# Patient Record
Sex: Female | Born: 1945 | Race: White | Hispanic: No | Marital: Married | Smoking: Never smoker
Health system: Southern US, Community
[De-identification: ages and names within clinical notes are randomized; demographics above are authoritative.]

## PROBLEM LIST (undated history)

## (undated) DIAGNOSIS — C801 Malignant (primary) neoplasm, unspecified: Secondary | ICD-10-CM

## (undated) DIAGNOSIS — M1990 Unspecified osteoarthritis, unspecified site: Secondary | ICD-10-CM

## (undated) DIAGNOSIS — R102 Pelvic and perineal pain unspecified side: Secondary | ICD-10-CM

## (undated) DIAGNOSIS — T4145XA Adverse effect of unspecified anesthetic, initial encounter: Secondary | ICD-10-CM

## (undated) DIAGNOSIS — Z8719 Personal history of other diseases of the digestive system: Secondary | ICD-10-CM

## (undated) DIAGNOSIS — T8859XA Other complications of anesthesia, initial encounter: Secondary | ICD-10-CM

## (undated) DIAGNOSIS — F419 Anxiety disorder, unspecified: Secondary | ICD-10-CM

## (undated) DIAGNOSIS — M4800 Spinal stenosis, site unspecified: Secondary | ICD-10-CM

## (undated) DIAGNOSIS — F329 Major depressive disorder, single episode, unspecified: Secondary | ICD-10-CM

## (undated) DIAGNOSIS — R569 Unspecified convulsions: Secondary | ICD-10-CM

## (undated) DIAGNOSIS — N3010 Interstitial cystitis (chronic) without hematuria: Secondary | ICD-10-CM

## (undated) DIAGNOSIS — M797 Fibromyalgia: Secondary | ICD-10-CM

## (undated) DIAGNOSIS — K589 Irritable bowel syndrome without diarrhea: Secondary | ICD-10-CM

## (undated) DIAGNOSIS — G43909 Migraine, unspecified, not intractable, without status migrainosus: Secondary | ICD-10-CM

## (undated) DIAGNOSIS — F32A Depression, unspecified: Secondary | ICD-10-CM

## (undated) DIAGNOSIS — M810 Age-related osteoporosis without current pathological fracture: Secondary | ICD-10-CM

## (undated) DIAGNOSIS — K219 Gastro-esophageal reflux disease without esophagitis: Secondary | ICD-10-CM

## (undated) DIAGNOSIS — I10 Essential (primary) hypertension: Secondary | ICD-10-CM

## (undated) DIAGNOSIS — E785 Hyperlipidemia, unspecified: Secondary | ICD-10-CM

## (undated) DIAGNOSIS — E039 Hypothyroidism, unspecified: Secondary | ICD-10-CM

## (undated) DIAGNOSIS — T7840XA Allergy, unspecified, initial encounter: Secondary | ICD-10-CM

## (undated) DIAGNOSIS — H409 Unspecified glaucoma: Secondary | ICD-10-CM

## (undated) DIAGNOSIS — Z8679 Personal history of other diseases of the circulatory system: Secondary | ICD-10-CM

## (undated) DIAGNOSIS — E559 Vitamin D deficiency, unspecified: Secondary | ICD-10-CM

## (undated) DIAGNOSIS — H269 Unspecified cataract: Secondary | ICD-10-CM

## (undated) HISTORY — DX: Essential (primary) hypertension: I10

## (undated) HISTORY — DX: Hyperlipidemia, unspecified: E78.5

## (undated) HISTORY — DX: Unspecified convulsions: R56.9

## (undated) HISTORY — DX: Depression, unspecified: F32.A

## (undated) HISTORY — DX: Major depressive disorder, single episode, unspecified: F32.9

## (undated) HISTORY — PX: BREAST BIOPSY: SHX20

## (undated) HISTORY — DX: Anxiety disorder, unspecified: F41.9

## (undated) HISTORY — PX: CARDIOVASCULAR STRESS TEST: SHX262

## (undated) HISTORY — PX: UPPER GASTROINTESTINAL ENDOSCOPY: SHX188

## (undated) HISTORY — PX: CARDIAC CATHETERIZATION: SHX172

## (undated) HISTORY — DX: Unspecified cataract: H26.9

## (undated) HISTORY — DX: Age-related osteoporosis without current pathological fracture: M81.0

## (undated) HISTORY — PX: COLONOSCOPY: SHX174

## (undated) HISTORY — DX: Interstitial cystitis (chronic) without hematuria: N30.10

## (undated) HISTORY — DX: Allergy, unspecified, initial encounter: T78.40XA

## (undated) HISTORY — PX: CATARACT EXTRACTION W/ INTRAOCULAR LENS  IMPLANT, BILATERAL: SHX1307

---
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
## 1980-11-22 HISTORY — PX: TOTAL ABDOMINAL HYSTERECTOMY W/ BILATERAL SALPINGOOPHORECTOMY: SHX83

## 1994-11-22 HISTORY — PX: OTHER SURGICAL HISTORY: SHX169

## 2002-11-22 HISTORY — PX: GLAUCOMA SURGERY: SHX656

## 2007-10-10 ENCOUNTER — Encounter (INDEPENDENT_AMBULATORY_CARE_PROVIDER_SITE_OTHER): Payer: Self-pay | Admitting: *Deleted

## 2007-10-10 ENCOUNTER — Ambulatory Visit: Payer: Self-pay | Admitting: Cardiology

## 2007-10-10 ENCOUNTER — Observation Stay (HOSPITAL_COMMUNITY): Admission: EM | Admit: 2007-10-10 | Discharge: 2007-10-11 | Payer: Self-pay | Admitting: *Deleted

## 2007-10-11 ENCOUNTER — Encounter: Payer: Self-pay | Admitting: Internal Medicine

## 2007-10-11 ENCOUNTER — Encounter: Payer: Self-pay | Admitting: Cardiology

## 2007-10-25 ENCOUNTER — Ambulatory Visit: Payer: Self-pay

## 2007-10-26 ENCOUNTER — Ambulatory Visit: Payer: Self-pay

## 2007-10-26 ENCOUNTER — Ambulatory Visit: Payer: Self-pay | Admitting: Cardiology

## 2007-10-26 LAB — CONVERTED CEMR LAB
BUN: 16 mg/dL (ref 6–23)
Basophils Absolute: 0 10*3/uL (ref 0.0–0.1)
Basophils Relative: 0 % (ref 0.0–1.0)
CO2: 27 meq/L (ref 19–32)
Calcium: 9.7 mg/dL (ref 8.4–10.5)
Chloride: 107 meq/L (ref 96–112)
Creatinine, Ser: 0.8 mg/dL (ref 0.4–1.2)
Hemoglobin: 14.1 g/dL (ref 12.0–15.0)
INR: 0.9 (ref 0.8–1.0)
Monocytes Relative: 7.4 % (ref 3.0–11.0)
Platelets: 257 10*3/uL (ref 150–400)
Prothrombin Time: 11.8 s (ref 10.9–13.3)
RBC: 4.6 M/uL (ref 3.87–5.11)
RDW: 12.2 % (ref 11.5–14.6)
aPTT: 30.8 s — ABNORMAL HIGH (ref 21.7–29.8)

## 2007-10-27 ENCOUNTER — Ambulatory Visit (HOSPITAL_COMMUNITY): Admission: RE | Admit: 2007-10-27 | Discharge: 2007-10-27 | Payer: Self-pay | Admitting: Cardiology

## 2007-10-27 ENCOUNTER — Ambulatory Visit: Payer: Self-pay | Admitting: Cardiology

## 2007-12-01 ENCOUNTER — Ambulatory Visit: Payer: Self-pay | Admitting: Cardiology

## 2008-07-04 ENCOUNTER — Encounter: Admission: RE | Admit: 2008-07-04 | Discharge: 2008-07-04 | Payer: Self-pay | Admitting: Internal Medicine

## 2008-07-09 ENCOUNTER — Ambulatory Visit: Payer: Self-pay | Admitting: Internal Medicine

## 2008-07-09 DIAGNOSIS — R079 Chest pain, unspecified: Secondary | ICD-10-CM

## 2008-07-09 DIAGNOSIS — H409 Unspecified glaucoma: Secondary | ICD-10-CM | POA: Insufficient documentation

## 2008-07-09 DIAGNOSIS — K219 Gastro-esophageal reflux disease without esophagitis: Secondary | ICD-10-CM | POA: Insufficient documentation

## 2008-07-09 DIAGNOSIS — F329 Major depressive disorder, single episode, unspecified: Secondary | ICD-10-CM | POA: Insufficient documentation

## 2008-07-09 DIAGNOSIS — M129 Arthropathy, unspecified: Secondary | ICD-10-CM | POA: Insufficient documentation

## 2008-07-09 DIAGNOSIS — E039 Hypothyroidism, unspecified: Secondary | ICD-10-CM | POA: Insufficient documentation

## 2008-07-09 DIAGNOSIS — K589 Irritable bowel syndrome without diarrhea: Secondary | ICD-10-CM

## 2008-07-10 ENCOUNTER — Encounter: Payer: Self-pay | Admitting: Gastroenterology

## 2008-07-10 DIAGNOSIS — R933 Abnormal findings on diagnostic imaging of other parts of digestive tract: Secondary | ICD-10-CM | POA: Insufficient documentation

## 2008-07-11 ENCOUNTER — Ambulatory Visit (HOSPITAL_COMMUNITY): Admission: RE | Admit: 2008-07-11 | Discharge: 2008-07-11 | Payer: Self-pay | Admitting: Internal Medicine

## 2008-07-12 ENCOUNTER — Telehealth: Payer: Self-pay | Admitting: Internal Medicine

## 2008-07-15 ENCOUNTER — Ambulatory Visit: Payer: Self-pay | Admitting: Internal Medicine

## 2008-08-06 ENCOUNTER — Telehealth: Payer: Self-pay | Admitting: Internal Medicine

## 2008-08-08 ENCOUNTER — Encounter: Admission: RE | Admit: 2008-08-08 | Discharge: 2008-08-08 | Payer: Self-pay | Admitting: Internal Medicine

## 2008-11-22 HISTORY — PX: RHINOPLASTY: SUR1284

## 2009-08-13 ENCOUNTER — Encounter: Admission: RE | Admit: 2009-08-13 | Discharge: 2009-08-13 | Payer: Self-pay | Admitting: Internal Medicine

## 2009-10-30 ENCOUNTER — Encounter: Admission: RE | Admit: 2009-10-30 | Discharge: 2009-10-30 | Payer: Self-pay | Admitting: Otolaryngology

## 2010-02-17 ENCOUNTER — Encounter: Payer: Self-pay | Admitting: Cardiology

## 2010-02-18 ENCOUNTER — Ambulatory Visit: Payer: Self-pay | Admitting: Cardiology

## 2010-02-24 ENCOUNTER — Telehealth (INDEPENDENT_AMBULATORY_CARE_PROVIDER_SITE_OTHER): Payer: Self-pay | Admitting: *Deleted

## 2010-02-24 LAB — CONVERTED CEMR LAB
CRP, High Sensitivity: 2.1 (ref 0.00–5.00)
Sed Rate: 5 mm/hr (ref 0–22)

## 2010-02-25 ENCOUNTER — Encounter (HOSPITAL_COMMUNITY): Admission: RE | Admit: 2010-02-25 | Discharge: 2010-04-22 | Payer: Self-pay | Admitting: Cardiology

## 2010-02-25 ENCOUNTER — Ambulatory Visit: Payer: Self-pay | Admitting: Cardiology

## 2010-02-25 ENCOUNTER — Ambulatory Visit: Payer: Self-pay

## 2010-03-04 ENCOUNTER — Ambulatory Visit (HOSPITAL_COMMUNITY): Admission: RE | Admit: 2010-03-04 | Discharge: 2010-03-04 | Payer: Self-pay | Admitting: Cardiology

## 2010-03-04 ENCOUNTER — Encounter: Payer: Self-pay | Admitting: Cardiology

## 2010-03-04 ENCOUNTER — Ambulatory Visit: Payer: Self-pay

## 2010-03-04 ENCOUNTER — Ambulatory Visit: Payer: Self-pay | Admitting: Cardiovascular Disease

## 2010-03-04 HISTORY — PX: TRANSTHORACIC ECHOCARDIOGRAM: SHX275

## 2010-03-23 ENCOUNTER — Ambulatory Visit: Payer: Self-pay | Admitting: Cardiology

## 2010-03-23 ENCOUNTER — Telehealth: Payer: Self-pay | Admitting: Internal Medicine

## 2010-03-23 DIAGNOSIS — R1013 Epigastric pain: Secondary | ICD-10-CM | POA: Insufficient documentation

## 2010-03-26 ENCOUNTER — Encounter: Admission: RE | Admit: 2010-03-26 | Discharge: 2010-03-26 | Payer: Self-pay | Admitting: Cardiology

## 2010-03-26 ENCOUNTER — Ambulatory Visit: Payer: Self-pay | Admitting: Internal Medicine

## 2010-03-26 ENCOUNTER — Encounter (INDEPENDENT_AMBULATORY_CARE_PROVIDER_SITE_OTHER): Payer: Self-pay | Admitting: *Deleted

## 2010-03-26 DIAGNOSIS — R1011 Right upper quadrant pain: Secondary | ICD-10-CM

## 2010-03-27 LAB — CONVERTED CEMR LAB
ALT: 17 units/L (ref 0–35)
Basophils Relative: 0.6 % (ref 0.0–3.0)
Bilirubin Urine: NEGATIVE
Calcium: 8.9 mg/dL (ref 8.4–10.5)
Chloride: 107 meq/L (ref 96–112)
Creatinine, Ser: 1 mg/dL (ref 0.4–1.2)
Eosinophils Absolute: 0.8 10*3/uL — ABNORMAL HIGH (ref 0.0–0.7)
Eosinophils Relative: 9.2 % — ABNORMAL HIGH (ref 0.0–5.0)
GFR calc non Af Amer: 63.35 mL/min (ref >60)
HCT: 39.9 % (ref 36.0–46.0)
Hemoglobin, Urine: NEGATIVE
Ketones, ur: NEGATIVE mg/dL
Lymphs Abs: 2.2 10*3/uL (ref 0.7–4.0)
MCHC: 33.7 g/dL (ref 30.0–36.0)
MCV: 88.2 fL (ref 78.0–100.0)
Monocytes Absolute: 0.8 10*3/uL (ref 0.1–1.0)
Neutrophils Relative %: 54.3 % (ref 43.0–77.0)
Platelets: 275 10*3/uL (ref 150.0–400.0)
Potassium: 4.6 meq/L (ref 3.5–5.1)
RBC: 4.52 M/uL (ref 3.87–5.11)
Total Protein, Urine: NEGATIVE mg/dL
Total Protein: 6.6 g/dL (ref 6.0–8.3)
Urine Glucose: NEGATIVE mg/dL
WBC: 8.4 10*3/uL (ref 4.5–10.5)
pH: 6.5 (ref 5.0–8.0)

## 2010-03-30 ENCOUNTER — Ambulatory Visit: Payer: Self-pay | Admitting: Physician Assistant

## 2010-03-30 ENCOUNTER — Ambulatory Visit: Payer: Self-pay | Admitting: Internal Medicine

## 2010-03-30 ENCOUNTER — Telehealth: Payer: Self-pay | Admitting: Physician Assistant

## 2010-03-31 ENCOUNTER — Encounter: Payer: Self-pay | Admitting: Cardiology

## 2010-04-03 ENCOUNTER — Telehealth (INDEPENDENT_AMBULATORY_CARE_PROVIDER_SITE_OTHER): Payer: Self-pay | Admitting: *Deleted

## 2010-04-28 ENCOUNTER — Ambulatory Visit: Payer: Self-pay | Admitting: Internal Medicine

## 2010-08-25 ENCOUNTER — Encounter: Admission: RE | Admit: 2010-08-25 | Discharge: 2010-08-25 | Payer: Self-pay | Admitting: Internal Medicine

## 2010-11-22 HISTORY — PX: OTHER SURGICAL HISTORY: SHX169

## 2010-12-22 NOTE — Progress Notes (Signed)
Summary: change meds  Phone Note Call from Patient Call back at Work Phone [phone]   Caller: Patient Call For: Dr. Marina Goodell Reason for Call: Talk to Nurse, Privacy/Consent Authorization Summary of Call: allergic Cipro and would like a change in medication called in... [facility] on [facility] Initial call taken by: Vallarie Mare,  Mar 30, 2010 10:23 AM  Follow-up for Phone Call        cipro is not listed as an allergy  on her med record--lets wait for the culture before we call anything else in. Follow-up by: Sammuel Cooper PA-c,  Mar 30, 2010 2:40 PM

## 2010-12-22 NOTE — Assessment & Plan Note (Signed)
Summary: ok per anne/per check out/sf   Primary Provider:  Buren Kos, MD  CC:  follow up for echo and stress test. pt has not felt well at all for the last several days.  She reports no stamina.  History of Present Illness: 65 yo with history of hyperlipidemia, GERD, and probable acute pericarditis in 2008 returns for evaluation of chest pain.  Patient had an episode in 2008 of what was ultimately thought to be pericarditis.  She had an echo at that time showing a small pericardial effusion and actually ended up having a left heart cath, which showed normal coronaries.    Over the last 7-8 weeks, patient began to have episodes of a "hard" pain in her lower central chest/upper epigastrium.  These episodes were occurring about twice a week.  They are not related to exertion, she walks on a treadmill for 25-30 minutes most days and has no chest pain.  Pain will usually last about 10 minutes, then resolve spontaneously.  She has a history of GERD and thinks that this pain is different from her GERD.  She is actually already taking omeprazole 20 mg bid without relief of symptoms.   Pain is not pleuritic or positional.  No exertional dyspnea.  Of note, patient has been taking phentermine for weight loss.  However, she says that she was having chest pain episodes prior to starting phentermine.    Since I last saw her, the pain has gotten worse and is now happening every night, coming at some point after she eats dinner.  It is more clearly epigastric and band-like across the upper abdomen.  Over the weekend, she had an episode of RUQ pain radiating to the right flank that occurred after she ate breakfast.  This lasted all day.  She has very little energy.   ETT-myoview showed nonspecific < 1 mm ST depression on stress ECG and no evidence for ischemia or infarction on myoview images.  Echo showed moderate LVH, normal systolic function, and mild diastolic dysfunction.    ECG (initial appointment): NSR,  rSR' pattern, diffuse anterior and inferolateral T wave inversions. Compared to the 2008 ECG, T wave inversions are minimally more pronounced.   ECG (today): NSR, nonspecific T wave flattening diffusely, no LVH. T wave changes are less prominent than on the prior ECG.   Labs (3/11): ESR 5, CRP 2.1  Current Medications (verified): 1)  Synthroid 112 Mcg  Tabs (Levothyroxine Sodium) .... Take 1 Tablet By Mouth Once A Day 2)  Topamax 50 Mg  Tabs (Topiramate) .... Take 1 Tablet By Mouth Once A Day 3)  Cymbalta 60 Mg Cpep (Duloxetine Hcl) .... Once Daily 4)  Prilosec 20 Mg Cpdr (Omeprazole) .... Taek One Capsule Once Daily 5)  Centrum Silver   Tabs (Multiple Vitamins-Minerals) .... Take 1 Tablet By Mouth Once A Day 6)  Tylenol Extra Strength 500 Mg  Tabs (Acetaminophen) .... As Needed 7)  Zomig 5 Mg  Tabs (Zolmitriptan) .... As Needed 8)  Calcium 1200 1200-1000 Mg-Unit  Chew (Calcium Carbonate-Vit D-Min) .... Take 1 Tablet By Mouth Once A Day 9)  Vitamin D 16109 Unit  Caps (Ergocalciferol) .... Take 1 Tablet Weekly 10)  Allegra 180 Mg  Tabs (Fexofenadine Hcl) .... As Needed 11)  Fish Oil   Oil (Fish Oil) .... Take 1 Tablet By Mouth Once A Day 12)  Flax Seed Oil 1000 Mg  Caps (Flaxseed (Linseed)) .... Take 1 Tablet By Mouth Once A Day 13)  Travatan Z  0.004 %  Soln (Travoprost) .Marland Kitchen.. 1 Drop Each Eye Once Daily 14)  Flexeril 10 Mg Tabs (Cyclobenzaprine Hcl) .... As Needed For Pain 15)  Hydrochlorothiazide 12.5 Mg Caps (Hydrochlorothiazide) .... Take One Tablet Once Daily  Allergies (verified): 1)  ! Sulfa 2)  ! Avelox 3)  ! * Neomycin 4)  ! Asa 5)  Nsaids  Past History:  Past Medical History: 1. DEPRESSION (ICD-311) 2. IRRITABLE BOWEL SYNDROME (ICD-564.1) 3. HYPOTHYROIDISM (ICD-244.9) 4. GLAUCOMA (ICD-365.9) 5. GERD (ICD-530.81) 6. ARTHRITIS (ICD-716.90) 7. Acute pericarditis December 2008: Echo (12/08) showed EF 65%, mild AI, small pericardial effusion.  8. Myoview 12/08: no ischemia  or infarction.  ETT-myoview (4/11): 5'30", no chest pain (stopped due to fatigue), < 1 mm nonspecific ST depression in inferrior and anterolateral leads, anterior attenuation, no scar or ischemia on myoview images.  9. Left heart cath (12/08): Normal coronaries.  10. TAH/BSO 11. Hyperlipidemia 12. Echo (4/11): Moderate LVH (1.5 cm septal and posterior walls), EF 60%, mild diastolic dysfunction, mild AI.   Family History: Reviewed history from 02/18/2010 and no changes required. Family History of Breast Cancer: Maternal Aunt Family History of Uterine Cancer: MGM No FH of Colon Cancer: No premature CAD.   Review of Systems       All systems reviewed and negative except as per HPI.   Vital Signs:  Patient profile:   65 year old female Height:      62.5 inches Weight:      164 pounds BMI:     29.62 Pulse rate:   98 / minute Pulse rhythm:   regular BP sitting:   98 / 70  (left arm) Cuff size:   regular  Vitals Entered By: Judithe Modest CMA (Mar 23, 2010 2:00 PM)  Physical Exam  General:  Well developed, well nourished, no acute distress. Neck:  Neck supple, no JVD. No masses, thyromegaly or abnormal cervical nodes. Lungs:  Clear bilaterally to auscultation and percussion. Heart:  Non-displaced PMI, chest non-tender; regular rate and rhythm, S1, S2 without murmurs, rubs or gallops. Carotid upstroke normal, no bruit.  Pedals normal pulses. No edema, no varicosities. Abdomen:  Bowel sounds positive; abdomen soft. There was mild RUQ and peri-umbilical tenderness.  Extremities:  No clubbing or cyanosis. Neurologic:  Alert and oriented x 3. Psych:  Normal affect.   Impression & Recommendations:  Problem # 1:  CHEST PAIN (ICD-786.50) Chest pain now seems more epigastric and even RUQ in nature.  It seems to occur mostly at night after meals.  ETT-myoview was not suggestive of ischemia or infarction.  I think that GI sources of her pain need to be ruled out: will order abdominal US to  assess for gallstones (she has not had cholecystectomy).  I would also like her to see Dr. Marina Goodell (her GI MD) for possible EGD (? ulcer as cause of symptoms).  I cannot fully explain her abnormal ECG though it does appear that these changes have been present in the past and may have helped trigger the left heart cath she had in the past.  She does not have a pericardial effusion and inflammatory markers are normal, so I doubt recurrent acute pericarditis.   Problem # 2:  ABNORMAL ECHOCARDIOGRAM Patient had an echo showing moderate LV hypertrophy in the absence of significant HTN (she is taking HCTZ 12.5 daily to try to decrease fluid behind her ear).  No family history of significant heart disease.  Hypertrophic cardiomyopathy given the unexplained LVH would certainly be a  consideration (though it would likely be a fairly benign version of HCM given lack of trouble through the years).  I will see her back after her GI workup.  I will think about getting a cardiac MRI for assessment of delayed enhancement for scar (often seen in the thick walls of HCM).   Other Orders: EKG w/ Interpretation (93000) Misc. Referral (Misc. Ref) Gastroenterology Referral (GI)  Patient Instructions: 1)  You have been referred to Dr Marina Goodell at [facility] 2)  We will schedule you for an abdominal ultrsound prior to the appointment with Dr Marina Goodell. 3)  Your physician recommends that you schedule a follow-up appointment in: 2 months with Dr Shirlee Latch.

## 2010-12-22 NOTE — Progress Notes (Signed)
Summary: triage  Phone Note From Other Clinic Call back at [phone]   Caller: Lynnell Catalan from Dr Marca Ancona office Call For: Marina Goodell Reason for Call: Schedule Patient Appt Summary of Call: Dr Shirlee Latch would like this patient seen before 6-1 (1st avail) for ruq pain Initial call taken by: Tawni Levy,  Mar 23, 2010 3:04 PM  Follow-up for Phone Call        patient will come see Mike Gip PA 03/26/10 9:30 .  Lynnell Catalan will notify the patient  Darcey Nora RN, [facility]  Mar 24, 2010 8:53 AM Follow-up by: Darcey Nora RN, CGRN,  Mar 24, 2010 8:53 AM

## 2010-12-22 NOTE — Progress Notes (Signed)
Summary: Declines colonoscopy  Phone Note Outgoing Call   Summary of Call: Message left for pt. to call me back per request of Amy and Dr.Perry as a colonoscopy has been recommended Initial call taken by: Teryl Lucy RN,  Apr 03, 2010 10:41 AM  Follow-up for Phone Call        Pt. does not wish to have a colonoscopy.Says she still feels problem is her gall bladder.Will keep scheduled ov with Dr.Perry  on 04/28/10. Follow-up by: Teryl Lucy RN,  Apr 07, 2010 3:47 PM  Additional Follow-up for Phone Call Additional follow up Details #1::        ok Additional Follow-up by: Hilarie Fredrickson MD,  Apr 07, 2010 4:08 PM

## 2010-12-22 NOTE — Discharge Summary (Signed)
Summary: Discharge Summary  NAME:  April Berg, April Berg               ACCOUNT NO.:  [id number]      MEDICAL RECORD NO.:  [id number]          PATIENT TYPE:  INP      LOCATION:  2008                         FACILITY:  [facility]      PHYSICIAN:  Jonelle Sidle, MD DATE OF BIRTH:  May 28, 1946      DATE OF ADMISSION:  10/10/2007   DATE OF DISCHARGE:  10/11/2007                                  DISCHARGE SUMMARY      PROCEDURES:   1. Two-dimensional echocardiogram.   2. Abdominal ultrasound.      PRIMARY DISCHARGE DIAGNOSIS:   1. Epigastric/thoracic pain.   2. Pericardial effusion, question subacute pericarditis.   3. Gastrointestinal reflux.      SECONDARY DIAGNOSES:   1. History of hiatal hernia/gastroesophageal reflux disease.   2. Hypothyroidism.   3. Allergies or intolerances to SULFA, AVELOX, ASPIRIN, NSAIDS, and       NEOMYCIN.      NOTE:  Time to prepare discharge 45 minutes.      HOSPITAL COURSE:  The patient is a 65 year old female with no previous   history of coronary artery disease.  She went to see Dr. Clelia Croft with   thoracic and abdominal pain, and there was concern for a possible   cardiac origin of her symptoms because her EKG was abnormal and she was   having ongoing pain.  She was transferred by EMS to [facility]   for further evaluation and admitted by cardiology.      The patient's cardiac enzymes were negative for MI.  Her chest x-ray   showed possible cardiomegaly, but no acute disease.  An abdominal   ultrasound was ordered because of a consideration for a GI origin of her   symptoms; but, it showed no significant abnormalities.  There was a 3 cm   simple cyst of the mid left kidney and limited visualization of the   pancreatic tail due to overlying bowel gas, but no significant focal   parenchymal abnormality was seen.  The gallbladder had no gallbladder   wall thickening or pericholecystic fluid and the test showed no   gallstones; and, a negative  sonographic Murphy sign.  Her pain   continued, but was more mild, and her upper abdomen was slightly tender.   An echocardiogram was performed, which showed an EF of 60-65% with no   regional wall motion abnormalities and normal left ventricular wall   thickness.  There was a relatively small circumferential pericardial   effusion with a more moderate collection of fluid anteroapical to the   right ventricle and there was evidence of organization in this area, but   no clear evidence of right ventricular compromise.  The possibility of   subacute pericarditis was considered.      The patient was tolerating her symptoms well.  She had a D-dimer   checked, which was less than 0.22; and, an amylase, lipase and TSH were   within normal limits with a TSH of 0.903.  Her other labs including a   CBC  and BMET were within normal limits.  Liver function tests and the   CMET were also within normal limits.  She had been on Prilosec prior to   admission, but Dr. Clelia Croft felt this should be changed to Protonix 40 mg   and increased to twice a day, which was done.  She was not treated with   NSAIDS for the pericardial effusion because of her intolerance to them   on a GI basis.  Dr. Diona Browner felt she could be treated with a limited   course of colchicine and followed as an outpatient; however, if her   symptoms do not improve then further evaluation and possible treatment   with steroids can be considered.  He felt that her EKG changes could be   evolutionary changes related to the pericarditis.      The patient had no symptoms of hemodynamic compromise including   dizziness, orthostatic hypotension or hemodynamic instability.  Dr.   Diona Browner felt that she might have had mild pericarditis around the time   of her recent bronchitis and this caused the effusion.  He felt that she   was stable for discharge on medical therapy and with close outpatient   follow up arranged.  If her symptoms worsened or she  develops additional   fevers or chills she understands the need to contact us immediately.      DISCHARGE INSTRUCTIONS:      ACTIVITY:  The patient's activity level is to be increased gradually.   She was requested to limit strenuous activity.      DIET:  The patient is to stick to a low-sodium diet.      FOLLOW UP:  The patient is to follow up in our office on October 27, 2007 with an echocardiogram at 8:30 and a stress test at 9:45.  She   understands that if she does not feel up to doing the stress test she   can reschedule it, but, if at all possible, she should keep the   echocardiogram appointment.  She is to follow up with Dr. Diona Browner on   October 30, 2007 at 10 A.M. and she is to see Dr. Clelia Croft as well.      DISCHARGE MEDICATIONS:   1. Synthroid 112 mcg daily.   2. Provera to 40 mg a day.   3. Topamax 50 mg a day.   4. Zoloft 25 mg a day.   5. Protonix 40 mg twice a day.   6. Centrum Silver daily.   7. Tylenol arthritis as prior to admission.   8. Zomig as prior to admission.   9. Calcium with vitamin D as prior to admission.   10.Colchicine 0.6 mg twice a day for 10 days.               Theodore Demark, PA-C               Jonelle Sidle, MD   Electronically Signed         RB/MEDQ  D:  10/11/2007  T:  10/12/2007  Job:  [id number]      cc:   Lupita Raider, M.D.

## 2010-12-22 NOTE — Assessment & Plan Note (Signed)
Summary: ec6/ abn ekg, chest tightness pain. pt has bcbs/ gd   Primary Provider:  Buren Kos, MD  CC:  ec6/abnormal ekg. pt states she has not felt well  since her last "episode" this mornin.  Chest pain in the center of chest.  She describes it as long lasting sometimes and "hard pain"  .  History of Present Illness: 65 yo with history of hyperlipidemia, GERD, and probable acute pericarditis in 2008 presents for evaluation of chest pain.  Patient had an episode in 2008 of what was ultimately thought to be pericarditis.  She had an echo at that time showing a small pericardial effusion and actually ended up having a left heart cath, which showed normal coronaries.    Over the last 6-7 weeks, patient has begun to have episodes of a "hard" pain in her lower central chest.  These episodes will occur about twice a week.  They are not related to exertion, she walks on a treadmill for 25-30 minutes most days and has no chest pain.  Pain will usually last about 10 minutes, then resolve spontaneously.  However, she had an episode yesterday while sitting in a doctor's office that lasted for about 1 hour.  She does not think that the episodes are related to meals.  She has a history of GERD and thinks that this pain is different from her GERD.  She is actually already taking omeprazole 20 mg bid without relief of symptoms.   Pain is not pleuritic or positional.  No exertional dyspnea.  Of note, patient has been taking phentermine for weight loss.  However, she says that she was having chest pain episodes prior to starting phentermine.    ECG: NSR, rSR' pattern, diffuse anterior and inferolateral T wave inversions. Compared to the 2008 ECG, T wave inversions are minimally more pronounced.   Current Medications (verified): 1)  Synthroid 112 Mcg  Tabs (Levothyroxine Sodium) .... Take 1 Tablet By Mouth Once A Day 2)  Topamax 50 Mg  Tabs (Topiramate) .... Take 1 Tablet By Mouth Once A Day 3)  Cymbalta 60 Mg Cpep  (Duloxetine Hcl) .... Once Daily 4)  Prilosec 20 Mg Cpdr (Omeprazole) .... Taek One Capsule Once Daily 5)  Centrum Silver   Tabs (Multiple Vitamins-Minerals) .... Take 1 Tablet By Mouth Once A Day 6)  Tylenol Extra Strength 500 Mg  Tabs (Acetaminophen) .... As Needed 7)  Zomig 5 Mg  Tabs (Zolmitriptan) .... As Needed 8)  Calcium 1200 1200-1000 Mg-Unit  Chew (Calcium Carbonate-Vit D-Min) .... Take 1 Tablet By Mouth Once A Day 9)  Vitamin D 51761 Unit  Caps (Ergocalciferol) .... Take 1 Tablet Weekly 10)  Allegra 180 Mg  Tabs (Fexofenadine Hcl) .... As Needed 11)  Fish Oil   Oil (Fish Oil) .... Take 1 Tablet By Mouth Once A Day 12)  Flax Seed Oil 1000 Mg  Caps (Flaxseed (Linseed)) .... Take 1 Tablet By Mouth Once A Day 13)  Travatan Z 0.004 %  Soln (Travoprost) .Marland Kitchen.. 1 Drop Each Eye Once Daily 14)  Flexeril 10 Mg Tabs (Cyclobenzaprine Hcl) .... As Needed For Pain  Allergies (verified): 1)  ! Sulfa 2)  ! Avelox 3)  ! * Neomycin 4)  ! Asa 5)  Nsaids  Past History:  Past Medical History: 1. DEPRESSION (ICD-311) 2. IRRITABLE BOWEL SYNDROME (ICD-564.1) 3. HYPOTHYROIDISM (ICD-244.9) 4. GLAUCOMA (ICD-365.9) 5. GERD (ICD-530.81) 6. ARTHRITIS (ICD-716.90) 7. Acute pericarditis December 2008: Echo (12/08) showed EF 65%, mild AI, small pericardial effusion.  8. Myoview 12/08: no ischemia or infarction 9. Left heart cath (12/08): Normal coronaries.  10. TAH/BSO 11. Hyperlipidemia  Family History: Family History of Breast Cancer: Maternal Aunt Family History of Uterine Cancer: MGM No FH of Colon Cancer: No premature CAD.   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  Alcohol Use - yes  Review of Systems       All systems reviewed and negative except as per HPI.   Vital Signs:  Patient profile:   65 year old female Height:      62.5 inches Weight:      168 pounds BMI:     30.35 Pulse rate:   92 / minute Pulse rhythm:   regular BP sitting:   112 / 66   (left arm) Cuff size:   large  Vitals Entered By: Judithe Modest CMA (February 18, 2010 2:21 PM)  Physical Exam  General:  Well developed, well nourished, no acute distress. Head:  normocephalic and atraumatic Nose:  no deformity, discharge, inflammation, or lesions Mouth:  Teeth, gums and palate normal. Oral mucosa normal. Neck:  Neck supple, no JVD. No masses, thyromegaly or abnormal cervical nodes. Lungs:  Clear bilaterally to auscultation and percussion. Heart:  Non-displaced PMI, chest non-tender; regular rate and rhythm, S1, S2 without murmurs, rubs or gallops. Carotid upstroke normal, no bruit.  Pedals normal pulses. No edema, no varicosities. Abdomen:  Bowel sounds positive; abdomen soft and non-tender without masses, organomegaly, or hernias noted. No hepatosplenomegaly. Msk:  Back normal, normal gait. Muscle strength and tone normal. Extremities:  No clubbing or cyanosis. Neurologic:  Alert and oriented x 3. Skin:  Intact without lesions or rashes. Psych:  Normal affect.   Impression & Recommendations:  Problem # 1:  CHEST PAIN (ICD-786.50) Atypical chest pain.  She has a history of GERD but is taking two times a day omeprazole without relief.  Pain is nonexertional.  Given her history of prior acute pericarditis, I will check an echo for pericardial effusion as well as an ESR and CRP for inflammation.  I will risk stratify her with an ETT-myoview.  She says she cannot take ASA 81 mg daily as it worsens her GERD so will hold off until we get it more information.  I will get the patient's most recent lipids from Dr. Alver Fisher office.   Other Orders: TLB-Sedimentation Rate (ESR) (85652-ESR) Nuclear Stress Test (Nuc Stress Test) TLB-CRP-High Sensitivity (C-Reactive Protein) (86140-FCRP) Echocardiogram (Echo) Nuclear Stress Test (Nuc Stress Test)  Patient Instructions: 1)  Your physician recommends that you have lab today----Sed  Rate and CRP  786.50 2)  Your physician has  requested that you have an echocardiogram.  Echocardiography is a painless test that uses sound waves to create images of your heart. It provides your doctor with information about the size and shape of your heart and how well your heart's chambers and valves are working.  This procedure takes approximately one hour. There are no restrictions for this procedure. 3)  Your physician has requested that you have an exercise stress myoview.  For further information please visit [email or web address]  Please follow instruction sheet, as given. 4)  Your physician recommends that you schedule a follow-up appointment with Dr Shirlee Latch in 2-3 weeks after testing is completed.

## 2010-12-22 NOTE — Assessment & Plan Note (Signed)
Summary: Cardiology Nuclear Study  Nuclear Med Background Indications for Stress Test: Evaluation for Ischemia, Abnormal EKG   History: Echo, Heart Catheterization, Myocardial Perfusion Study  History Comments: '08 Heart Cath: NL coronaries '08 Echo: EF= 65%, mild AI 12/08 MPS:EF=81%, NL  Symptoms: Chest Pain, Palpitations    Nuclear Pre-Procedure Cardiac Risk Factors: Lipids Caffeine/Decaff Intake: None NPO After: 7:00 PM Lungs: clear IV 0.9% NS with Angio Cath: 22g     IV Site: (R) Wrist IV Started by: Irean Hong RN Chest Size (in) 38     Cup Size C     Height (in): 62.5 Weight (lb): 163 BMI: 29.44  Nuclear Med Study 1 or 2 day study:  1 day     Stress Test Type:  Stress Reading MD:  Olga Millers, MD     Referring MD:  D.McLean Resting Radionuclide:  Technetium 66m Tetrofosmin     Resting Radionuclide Dose:  11 mCi  Stress Radionuclide:  Technetium 54m Tetrofosmin     Stress Radionuclide Dose:  33 mCi   Stress Protocol Exercise Time (min):  5:30 min     Max HR:  144 bpm     Predicted Max HR:  159 bpm  Max Systolic BP: 155 mm Hg     Percent Max HR:  90.57 %     METS: 7.0 Rate Pressure Product:  47425    Stress Test Technologist:  Milana Na EMT-P     Nuclear Technologist:  Domenic Polite CNMT  Rest Procedure  Myocardial perfusion imaging was performed at rest 45 minutes following the intravenous administration of Myoview Technetium 91m Tetrofosmin.  Stress Procedure  The patient exercised for 5:30. The patient stopped due to fatigue and denied any chest pain.  There were non specific ST-T wave changes and a rare pac.  Myoview was injected at peak exercise and myocardial perfusion imaging was performed after a brief delay.  QPS Raw Data Images:  Acuisition technically good; normal left ventricular size. Stress Images:  There is mild decreased uptake in the anterior wall. Rest Images:  There is mild decreased uptake in the anterior wall. Subtraction  (SDS):  No evidence of ischemia. Transient Ischemic Dilatation:  .96  (Normal <1.22)  Lung/Heart Ratio:  .27  (Normal <0.45)  Quantitative Gated Spect Images QGS EDV:  43 ml QGS ESV:  8 ml QGS EF:  81 % QGS cine images:  Normal wall motion.   Overall Impression  Exercise Capacity: Fair exercise capacity. BP Response: Normal blood pressure response. Clinical Symptoms: No chest pain ECG Impression: No significant ST segment change suggestive of ischemia. Overall Impression: There is mild soft tissue attenuation but  no sign of scar or ischemia.  Appended Document: Cardiology Nuclear Study normal study  Appended Document: Cardiology Nuclear Study LMVM  Appended Document: Cardiology Nuclear Study discussed results with pt by telephone

## 2010-12-22 NOTE — Progress Notes (Signed)
Summary: Nuclear Pre-Procedure  Phone Note Outgoing Call Call back at Home Phone [phone]   Call placed by: Stanton Kidney, EMT-P,  February 24, 2010 3:33 PM Call placed to: Patient Action Taken: Phone Call Completed Summary of Call: Reviewed information on Myoview Information Sheet (see scanned document for further details).  Spoke with Patient.    Nuclear Med Background Indications for Stress Test: Evaluation for Ischemia, Abnormal EKG   History: Echo, Heart Catheterization, Myocardial Perfusion Study  History Comments: '08 Heart Cath: NL coronaries '08 Echo: EF= 65%, mild AI 12/08 MPS:EF=81%, NL  Symptoms: Chest Pain    Nuclear Pre-Procedure Cardiac Risk Factors: Lipids Height (in): 62.5

## 2010-12-22 NOTE — Assessment & Plan Note (Signed)
Summary: F/U Non- Cardiac Chest pain (saw PA 03-26-10)   History of Present Illness Visit Type: Follow-up Visit Primary GI MD: Yancey Flemings MD Primary Provider: Buren Kos, MD Requesting Provider: Fransico Meadow Chief Complaint: follow-up noncardiac chest pain   History of Present Illness:   65 year old female with hypothyroidism, hyperlipidemia, GERD, arthritis, depression, prior pericarditis, and chronic recurrent chest pain. She has a prior history of problems with chest pain of uncertain etiology. More recently problems with chest pain beginning around February. She describes it as sharp heavy and occasionally crushing. The pain can occur any time during the day and will not awaken her. It is not affected by meals. She stays on PPI therapy without classic GERD symptoms. Some chronic mild intermittent transfer dysphagia. Prior upper endoscopy for similar complaints in 2009 was negative. The discomfort lasts anywhere from 10-15 minutes to as much as several hours. She was recently seen by cardiology with negative workup. She was also seen by the GI physician assistant on May 5. Laboratories were unremarkable. Urinalysis was equivocal with a negative culture. CT Scan of the chest and abdomen revealed no acute problems in the chest with questionable esophageal air fluid level. Constipation noted in the colon. Purging her colon did not help.. Patient was switched from Protonix to Dexilant. She thinks that she has been doing better. One episode of pain in 3 weeks, last occurring 12 days ago. She also mentions some soreness in the right upper quadrant occasionally and negative ultrasound without stones. Prior colonoscopy in the mid 2000s was normal, elsewhere. Terms of her chest pain, she cannot identify any factors that precipitated or improve the pain. She has tried antacids such as TUMS as well as pain medication such as like it without relief. She wonders about her gallbladder.   GI Review of Systems    Reports abdominal pain, acid reflux, chest pain, dysphagia with solids, heartburn, and  nausea.     Location of  Abdominal pain: RUQ.    Denies belching, bloating, dysphagia with liquids, loss of appetite, vomiting, vomiting blood, weight loss, and  weight gain.        Denies anal fissure, black tarry stools, change in bowel habit, constipation, diarrhea, diverticulosis, fecal incontinence, heme positive stool, hemorrhoids, irritable bowel syndrome, jaundice, light color stool, liver problems, rectal bleeding, and  rectal pain.    Current Medications (verified): 1)  Synthroid 112 Mcg  Tabs (Levothyroxine Sodium) .... Take 1 Tablet By Mouth Once A Day 2)  Topamax 50 Mg  Tabs (Topiramate) .... Take 1 Tablet By Mouth Once A Day 3)  Cymbalta 60 Mg Cpep (Duloxetine Hcl) .... Once Daily 4)  Centrum Silver   Tabs (Multiple Vitamins-Minerals) .... Take 1 Tablet By Mouth Once A Day 5)  Tylenol Extra Strength 500 Mg  Tabs (Acetaminophen) .... As Needed 6)  Zomig 5 Mg  Tabs (Zolmitriptan) .... As Needed 7)  Calcium-Magnesium-Vitamin D 400-166.7-133.3 Mg-Mg-Uni Tabs (Calcium-Magnesium-Vitamin D) .Marland Kitchen.. 1 By Mouth Two Times A Day 8)  Vitamin D 32951 Unit  Caps (Ergocalciferol) .... Take 1 Tablet Weekly 9)  Allegra 180 Mg  Tabs (Fexofenadine Hcl) .... As Needed 10)  Fish Oil   Oil (Fish Oil) .... Take 1 Tablet By Mouth Once A Day 11)  Flax Seed Oil 1000 Mg  Caps (Flaxseed (Linseed)) .... Take 1 Tablet By Mouth Once A Day 12)  Travatan Z 0.004 %  Soln (Travoprost) .Marland Kitchen.. 1 Drop Each Eye Once Daily 13)  Flexeril 10 Mg Tabs (Cyclobenzaprine Hcl) .Marland KitchenMarland KitchenMarland Kitchen  As Needed For Pain 14)  Vicodin 5-500 Mg Tabs (Hydrocodone-Acetaminophen) .... Take 1 Tab Every 6 Hours As Needed For Pain 15)  Dexilant 60 Mg Cpdr (Dexlansoprazole) .Marland Kitchen.. 1 By Mouth Once Daily  Allergies (verified): 1)  ! Sulfa 2)  ! Avelox 3)  ! * Neomycin 4)  ! Asa 5)  Nsaids  Past History:  Past Medical History: Reviewed history from 03/23/2010 and no  changes required. 1. DEPRESSION (ICD-311) 2. IRRITABLE BOWEL SYNDROME (ICD-564.1) 3. HYPOTHYROIDISM (ICD-244.9) 4. GLAUCOMA (ICD-365.9) 5. GERD (ICD-530.81) 6. ARTHRITIS (ICD-716.90) 7. Acute pericarditis December 2008: Echo (12/08) showed EF 65%, mild AI, small pericardial effusion.  8. Myoview 12/08: no ischemia or infarction.  ETT-myoview (4/11): 5'30", no chest pain (stopped due to fatigue), < 1 mm nonspecific ST depression in inferrior and anterolateral leads, anterior attenuation, no scar or ischemia on myoview images.  9. Left heart cath (12/08): Normal coronaries.  10. TAH/BSO 11. Hyperlipidemia 12. Echo (4/11): Moderate LVH (1.5 cm septal and posterior walls), EF 60%, mild diastolic dysfunction, mild AI.   Past Surgical History: Reviewed history from 03/26/2010 and no changes required. Appendectomy Hysterectomy tonsillectomy Bilateral blepharoplasty Left elbow tendon release rhinoplasty  Family History: Reviewed history from 02/18/2010 and no changes required. Family History of Breast Cancer: Maternal Aunt Family History of Uterine Cancer: MGM No FH of Colon Cancer: No premature CAD.   Social History: Reviewed history from 03/26/2010 and no changes required. 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  Alcohol Use - yes  occ Illicit Drug Use - no  Review of Systems       The patient complains of allergy/sinus, cough, and voice change.  The patient denies anemia, anxiety-new, arthritis/joint pain, back pain, blood in urine, breast changes/lumps, change in vision, confusion, coughing up blood, depression-new, fainting, fatigue, fever, headaches-new, hearing problems, heart murmur, heart rhythm changes, itching, muscle pains/cramps, night sweats, nosebleeds, shortness of breath, skin rash, sleeping problems, sore throat, swelling of feet/legs, swollen lymph glands, thirst - excessive, urination - excessive, urination changes/pain, urine leakage, and vision  changes.    Vital Signs:  Patient profile:   65 year old female Height:      62.5 inches Weight:      165.4 pounds BMI:     29.88 Pulse rate:   88 / minute Pulse rhythm:   regular BP sitting:   118 / 76  (left arm)  Vitals Entered By: Milford Cage NCMA (April 28, 2010 8:55 AM)  Physical Exam  General:  Well developed, well nourished, no acute distress. Head:  Normocephalic and atraumatic. Eyes:  PERRLA, no icterus. Mouth:  No deformity or lesions, dentition normal. Chest Wall:  Symmetrical;  no deformities or tenderness. Lungs:  Clear throughout to auscultation. Heart:  Regular rate and rhythm; no murmurs, rubs,  or bruits. Abdomen:  Soft, nontender and nondistended. No masses, hepatosplenomegaly or hernias noted. Normal bowel sounds. Pulses:  Normal pulses noted. Extremities:  no edema Neurologic:  alert and oriented Skin:  meds none this Psych:  Alert and cooperative. Normal mood and affect.   Impression & Recommendations:  Problem # 1:  CHEST PAIN (ICD-786.50) Atypical noncardiac chest pain. Negative CT of the chest. Improvement since switching to Dexilant. May be cause and effect or simply coincident. Endoscopy in 2009 unrevealing. Discussed further workup including possible esophageal manometry or HIDA scan. At this point, since she is doing better, she wishes to continue on Dexilant and see how it goes. I think this is reasonable. As  such, she will contact the office for recurrent worsening problems. She will resume her general medical care with Dr. Clelia Croft  Problem # 2:  GERD (ICD-530.81) has classic GERD. Not at all clear that this relates to her chest pain. Negative endoscopy 2009.  Plan: #1. Continue PPI #2. Reflux precautions  Problem # 3:  ABDOMINAL PAIN-RUQ (ICD-789.01) intermittent. Negative ultrasound and CT. Normal LFTs. May consider a HIDA scan if problem recurs significantly  Patient Instructions: 1)  Please schedule a follow-up appointment as needed.    2)  The medication list was reviewed and reconciled.  All changed / newly prescribed medications were explained.  A complete medication list was provided to the patient / caregiver. 3)  Copy: Dr. Sandra Cockayne

## 2010-12-22 NOTE — Assessment & Plan Note (Signed)
Summary: non- cardiac CP/sheri   History of Present Illness Visit Type: Follow-up Visit Primary GI MD: Yancey Flemings MD Primary Provider: Buren Kos, MD Requesting Provider: Fransico Meadow Chief Complaint: Non-cardiac chest pain History of Present Illness:   April Berg 65 YO FEMALE KNOWN TO DR. PERRY WHO HAS UNDERGONE PRIOR WORKUP FOR NONCARDIAC CHEST PAIN. SHE WAS DX WITH PERICARDITIS IN 2009 WHICH RESOLVED AFTER  A FEW MONTHS. SHE ALSO HAS HX OF CHRONIC GERD, AND IS MAINTAINED ON TWICE DAILY PRILOSEC. SHE HAS AN EGD IN 2009 WHICH WAS NEGATIVE. SHE HAS RECENTLY UNDERGONE EVALUATION PER CARDIOLOGY FOR CHEST PAIN WHICH HAS BEEN INTERMITTENT OVER THE PAST 3 MONTHS.THIS IS A TIGHT ,PRESSURE  TYPE OF PAIN IN THE CENTER OF HER CHEST,NONEXERTIONAL.WORKUP HAS BEEN NEGATIVE ABDOMINAL US WAS DONE  EARLIER TODAY -NEGATIVE EXCEPT QUESTIONABLE FATTY CHANGES IN LIVER.   OVER THIS PAST WEEKEND SHE DEVELOPED A NEW PAIN IN THE RUQ INTO RIGHT  BACK WHICH WAS INTENSE,AND LASTED SEVERAL HOURS. SHE HAS BEEN SORE IN THAT AREA SINCE AND JUST HAS NOT FELT WELL-WEAK,FATIGUED.ALSO HAS HAD SOME MILD SOB WITH EXERTION. NO N/V, NO FEVER, NO CHANGE IN BOWEL HABITS,MELENA ETC. SHE SAYS THIS PAIN IS NOT HER REFLUX OR PERICARDITIS.   GI Review of Systems    Reports abdominal pain, acid reflux, and  chest pain.     Location of  Abdominal pain: RUQ.    Denies belching, bloating, dysphagia with liquids, dysphagia with solids, heartburn, loss of appetite, nausea, vomiting, vomiting blood, and  weight loss.        Denies anal fissure, black tarry stools, change in bowel habit, constipation, diarrhea, diverticulosis, fecal incontinence, heme positive stool, irritable bowel syndrome, jaundice, light color stool, liver problems, rectal bleeding, and  rectal pain. Preventive Screening-Counseling & Management      Drug Use:  no.      Current Medications (verified): 1)  Synthroid 112 Mcg  Tabs (Levothyroxine Sodium) .... Take 1  Tablet By Mouth Once A Day 2)  Topamax 50 Mg  Tabs (Topiramate) .... Take 1 Tablet By Mouth Once A Day 3)  Cymbalta 60 Mg Cpep (Duloxetine Hcl) .... Once Daily 4)  Prilosec 20 Mg Cpdr (Omeprazole) .... Taek One Capsule Two Times A Day 5)  Centrum Silver   Tabs (Multiple Vitamins-Minerals) .... Take 1 Tablet By Mouth Once A Day 6)  Tylenol Extra Strength 500 Mg  Tabs (Acetaminophen) .... As Needed 7)  Zomig 5 Mg  Tabs (Zolmitriptan) .... As Needed 8)  Calcium-Magnesium-Vitamin D 400-166.7-133.3 Mg-Mg-Uni Tabs (Calcium-Magnesium-Vitamin D) .Marland Kitchen.. 1 By Mouth Two Times A Day 9)  Vitamin D 13086 Unit  Caps (Ergocalciferol) .... Take 1 Tablet Weekly 10)  Allegra 180 Mg  Tabs (Fexofenadine Hcl) .... As Needed 11)  Fish Oil   Oil (Fish Oil) .... Take 1 Tablet By Mouth Once A Day 12)  Flax Seed Oil 1000 Mg  Caps (Flaxseed (Linseed)) .... Take 1 Tablet By Mouth Once A Day 13)  Travatan Z 0.004 %  Soln (Travoprost) .Marland Kitchen.. 1 Drop Each Eye Once Daily 14)  Flexeril 10 Mg Tabs (Cyclobenzaprine Hcl) .... As Needed For Pain 15)  Vicodin 5-500 Mg Tabs (Hydrocodone-Acetaminophen) .... Take 1 Tab Every 6 Hours As Needed For Pain  Allergies (verified): 1)  ! Sulfa 2)  ! Avelox 3)  ! * Neomycin 4)  ! Asa 5)  Nsaids  Past History:  Past Medical History: Last updated: 03/23/2010 1. DEPRESSION (ICD-311) 2. IRRITABLE BOWEL SYNDROME (ICD-564.1) 3. HYPOTHYROIDISM (ICD-244.9) 4. GLAUCOMA (  ICD-365.9) 5. GERD (ICD-530.81) 6. ARTHRITIS (ICD-716.90) 7. Acute pericarditis December 2008: Echo (12/08) showed EF 65%, mild AI, small pericardial effusion.  8. Myoview 12/08: no ischemia or infarction.  ETT-myoview (4/11): 5'30", no chest pain (stopped due to fatigue), < 1 mm nonspecific ST depression in inferrior and anterolateral leads, anterior attenuation, no scar or ischemia on myoview images.  9. Left heart cath (12/08): Normal coronaries.  10. TAH/BSO 11. Hyperlipidemia 12. Echo (4/11): Moderate LVH (1.5 cm septal  and posterior walls), EF 60%, mild diastolic dysfunction, mild AI.   Family History: Last updated: 02/18/2010 Family History of Breast Cancer: Maternal Aunt Family History of Uterine Cancer: MGM No FH of Colon Cancer: No premature CAD.   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  Alcohol Use - yes  occ Illicit Drug Use - no  Review of Systems       The patient complains of fatigue and shortness of breath.  The patient denies allergy/sinus, anemia, anxiety-new, arthritis/joint pain, back pain, blood in urine, breast changes/lumps, change in vision, confusion, cough, coughing up blood, depression-new, fainting, fever, headaches-new, hearing problems, heart murmur, heart rhythm changes, itching, menstrual pain, muscle pains/cramps, night sweats, nosebleeds, pregnancy symptoms, skin rash, sleeping problems, sore throat, swelling of feet/legs, swollen lymph glands, thirst - excessive , urination - excessive , urination changes/pain, urine leakage, vision changes, and voice change.         ROS OTHERWISE AS IN HPI  Vital Signs:  Patient profile:   65 year old female Height:      62.5 inches Weight:      164 pounds BMI:     29.62 BSA:     1.77 Pulse rate:   102 / minute BP sitting:   98 / 70  (left arm)  Vitals Entered By: Merri Ray CMA Duncan Dull) (Mar 26, 2010 1:36 PM)  Physical Exam  General:  Well developed, well nourished, no acute distress. Head:  Normocephalic and atraumatic. Eyes:  PERRLA, no icterus. Lungs:  Clear throughout to auscultation. Heart:  Regular rate and rhythm; no murmurs, rubs,  or bruits. Abdomen:  SOFT, TENDER IN EPIGASTRIUM, AND RUQ, ALSO TENDER OVER  LOWER RIGHT COSTAL MARGIN ,AND POSTERIORLY OVER RIGHT LOWER RIBS, BS+, NO PALP MASS OR HSM, NO SKIN ERUPTION Rectal:  NOT DONE Extremities:  No clubbing, cyanosis, edema or deformities noted. Neurologic:  Alert and  oriented x4;  grossly normal neurologically. Psych:  Alert and cooperative. Normal mood and affect.   Impression & Recommendations:  Problem # 1:  CHEST PAIN (ICD-786.50) Assessment Deteriorated 64 YO FEMALE WITH HX OF PERICARDITIS 2008,HX OF CHRONIC GERD -CONTROLLED ON TWICE DAILY PRILOSEC WITH 3 MONTH HX OF RECURENT MID SUBSTERNAL CHEST PAIN, NONEXERTIONAL WITH NEGATIVE CARDIAC WORKUP- ETIOLOGY NOT CLEAR ? SPASM  LABS AS BELOW  CHEST CT  Orders: CT Chest/Abdomen w IV and Oral Contrast (CT CH/ABD w IV/Oral ) TLB-CMP (Comprehensive Metabolic Pnl) (80053-COMP) TLB-CRP-High Sensitivity (C-Reactive Protein) (86140-FCRP) TLB-CBC Platelet - w/Differential (85025-CBCD) TLB-Lipase (83690-LIPASE) TLB-Udip w/ Micro (81001-URINE)  Problem # 2:  ABDOMINAL PAIN-RUQ (ICD-789.01) Assessment: New NEW RUQ PAIN, X  5 DAYS WITH NEGATIVE [place]. SOME COMPONENT OF MUSCULOSKELETAL PAIN-COSTOCHONDRITIS,THOUGH DOE NOT EXPLAIN ALL OF HER ABDOMINAL PAIN,WEAKNESS ETC. LABS AS ABOVE SCHEDULE CT OF  ABDOMEN VICODEN 5/500 Q 6 HOURS AS NEED FOR PAIN IF ABDOMINAL CT UNREVEALING ,WILL NEED [facility] HIDA SCAN.  Problem # 3:  GERD (ICD-530.81) Assessment: Unchanged  Problem # 4:  GLAUCOMA (ICD-365.9) Assessment: Comment Only  Patient Instructions: 1)  Your physician has requested that you have the following labwork done today: Go to lab, basement level. 2)  We have faxed a perscription of Vicodin for pain to your pharmacy, [facility], [address]. 3)  We scheduled the CT scan at [facility] [address]. across from [facility], [place].  4)  The date is Mon 03-30-10. Contrast and directions given. 5)  Copy sent to : Buren Kos, MD 6)                         Elly Modena, MD 7)  The  medication list was reviewed and reconciled.  All changed / newly prescribed medications were explained.  A complete medication list was provided to the patient / caregiver. Prescriptions: VICODIN 5-500 MG TABS (HYDROCODONE-ACETAMINOPHEN) Take 1 tab every 6 hours as needed for pain  #10 x 0   Entered by:   Lowry Ram NCMA   Authorized by:   Sammuel Cooper PA-c   Signed by:   Lowry Ram NCMA on 03/26/2010   Method used:   Printed then faxed to ...       [facility] (retail)       [phone]       [place], [place]  [address]       Ph: [phone]       Fax: [phone]   RxID:   [id number]  Faxed perscription to [facility], [address], ([place]).

## 2010-12-22 NOTE — Consult Note (Signed)
Summary: [facility]   Imported By: Marylou Mccoy 04/15/2010 10:22:37  _____________________________________________________________________  External Attachment:    Type:   Image     Comment:   External Document

## 2011-01-06 ENCOUNTER — Ambulatory Visit (HOSPITAL_COMMUNITY)
Admission: RE | Admit: 2011-01-06 | Discharge: 2011-01-06 | Disposition: A | Discharge status: Home / Self Care | Payer: BC Managed Care – PPO | Source: Ambulatory Visit | Attending: Internal Medicine | Admitting: Internal Medicine

## 2011-01-06 DIAGNOSIS — R0989 Other specified symptoms and signs involving the circulatory and respiratory systems: Secondary | ICD-10-CM | POA: Insufficient documentation

## 2011-01-06 DIAGNOSIS — R0609 Other forms of dyspnea: Secondary | ICD-10-CM | POA: Insufficient documentation

## 2011-04-06 NOTE — Discharge Summary (Signed)
NAMEMIRAY, MANCINO               ACCOUNT NO.:  [id number]   MEDICAL RECORD NO.:  [id number]          PATIENT TYPE:  INP   LOCATION:  2008                         FACILITY:  [facility]   PHYSICIAN:  Jonelle Sidle, MD DATE OF BIRTH:  01/25/1946   DATE OF ADMISSION:  10/10/2007  DATE OF DISCHARGE:  10/11/2007                               DISCHARGE SUMMARY   PRIMARY CARE PHYSICIAN:  Dr. Buren Kos.   REASON FOR ADMISSION:  Abnormal electrocardiogram with recent left  scapular and abdominal discomfort.   HISTORY OF PRESENT ILLNESS:  Ms. Abdelrahman is a pleasant 65 year old woman  with a history of hypothyroidism for 20 years, gastroesophageal reflux  disease with hiatal hernia, and migraine headaches.  She reports no  clear history of cardiovascular disease and states that she underwent a  myocardial perfusion study approximately 15 years ago that was normal.  She states that last Tuesday, she suddenly developed a discomfort in her  left scapula which felt like a throbbing discomfort.  This was  essentially constant in a waxing and waning character over the next few  days and became worse on Friday.  At that time, she also developed  abdominal dull aching and then on Saturday and Sunday had intermittent  symptoms from both areas.  She did not notice any predictable worsening  of symptoms of activity or position change, had no associated cough or  pleuritic discomfort.  She also reported no worsening of symptoms with  food, no changes in bowel habits.  She saw Dr. Clelia Croft in the office today  and was ultimately transferred to the emergency department for further  evaluation.  She had an electrocardiogram obtained during her office  stay which showed nonspecific ST-T wave changes, specifically with some  ST-segment depression in lateral leads.  There was no significant  changes noted following sublingual nitroglycerin, in either the  patient's symptoms or her electrocardiogram.  Here in the  emergency  department, the patient is relatively comfortable, although still has  some mild residual abdominal pain.  She has been hemodynamically stable  and actually was requested to go home.   MEDICATIONS:  1. Zoloft 50 mg p.o. q.d.  2. Topamax 50 mg p.o. q.d.  3. Synthroid 0.112 mg p.o. q.d.  4. Prilosec 40 mg p.o. q.d.  5. Allegra.  6. Zomig p.r.n.   ALLERGIES:  SULFA DRUGS, AVELOX, NEOMYCIN AN INTOLERANCE TO BOTH ASPIRIN  AND NONSTEROIDAL ANTI-INFLAMMATORY DRUGS DUE TO SEVERE ABDOMINAL  PAIN.Marland Kitchen   PAST MEDICAL HISTORY:  Is as outlined above.  The patient has had  tonsillectomy, cesarean section, hysterectomy, appendectomy and cosmetic  eye surgery.  She still has her gallbladder.  She reports having  problems with intermittent significant abdominal discomfort back in her  20s and 30s that never had a clear etiology.  She also reports having a  normal gallbladder ultrasound approximately 1 year ago, when she had a  infection.   SOCIAL HISTORY:  The patient lives in [place].  She moved here in  May, to be closer to her grandchildren.  Her husband, a Armed forces operational officer,  still works in  [place].  She has no active tobacco use history.  She is  not exercising regularly.  She had been trying to use the [employer] intermittently, over the last 2 months.  She drinks occasional  wine.  She does report being under a lot of stress, over the last few  months.   FAMILY HISTORY:  Was reviewed, significant for hypertension but no  obvious premature cardiovascular disease.   REVIEW OF SYSTEMS:  As described in history of present illness.  The  patient reports recent bronchitis a few weeks ago.  No obvious recent  fevers, hemoptysis, melena, hematochezia, palpitations, orthopnea, PND,  or syncope or lower extremity edema.   EXAMINATION:  VITAL SIGNS:  The patient is afebrile, heart rate in the  80s and sinus rhythm, respirations 22, blood pressure 145/89, oxygen  saturation 100%  percent on 2 liters nasal cannula.  GENERAL:  This is a normally nourished-appearing woman in no acute  distress.  HEENT:  Conjunctiva and lids normal.  Oropharynx is clear.  NECK:  Supple.  No elevated jugular venous pressure, no loud bruits.  No  thyromegaly is noted.  LUNGS:  Clear without labored breathing, no wheezing noted.  CARDIAC:  Exam reveals a regular rate and rhythm, no S3 gallop or  pericardial rub.  No loud systolic murmur or diastolic murmur.  ABDOMEN:  Soft, mild tenderness to palpation in the epigastric area and  right upper quadrant.  No guarding; however.  Bowel sounds are present.  EXTREMITIES:  Exhibit no significant pitting edema.  Distal pulses are  2+.  SKIN:  Warm and dry.  MUSCULOSKELETAL:  No kyphosis is noted.  NEUROPSYCHIATRIC:  The patient is alert, oriented x3.   A 12-lead  Electrocardiogram in the emergency department shows sinus  rhythm with nonspecific ST-T wave changes.  These were mainly noted in  the anterolateral leads.   LABORATORY DATA:  Laboratory data, as well as chest x-ray and abdominal  ultrasound are pending at this time.   IMPRESSION:  1. Symptoms of left scapular pain and epigastric discomfort as      outlined above, waxing and waning over the last week.  The      patient's electrocardiogram does show largely nonspecific ST-T wave      abnormalities, and there was no significant response to a      sublingual nitroglycerin, given in the office by Dr. Clelia Croft.  The      patient is relatively comfortable now, although still has mild      residual abdominal pain.  Basic workup is underway.  She has no      clear history of cardiovascular disease, and reports a normal      myocardial perfusion study approximately 15 years ago.  She also      reports no definite gallbladder disease with a reportedly normal      abdominal ultrasound, approximately 1 year ago.  Only other recent      illness includes a reported bronchitis a few weeks ago.   She      presently has no cough or hemoptysis and has no pleuritic component      to her symptoms.  2. Longstanding history of hypothyroidism, on Synthroid.  3. Reported history of gastroesophageal reflux disease and hiatal      hernia.  She does report some increased reflux recently, on      Prilosec.   PLAN:  The patient will be admitted to telemetry.  We will follow  up  cardiac markers and serial electrocardiograms.  Additional labs are  pending, including a CMET, CBC, coagulation studies, amylase, lipase and  cardiac markers.  A 2-D echocardiogram will be ordered to exclude wall  motion abnormalities, and we will also follow up on her abdominal  ultrasound.  At this point, no anticoagulation is planned, and we will  not initiate aspirin given her history  of severe abdominal pain in the past with aspirin.  Further plans to  follow pending above evaluation.  I suspect at a minimum, she will need  a follow-up myocardial perfusion study from a cardiac perspective.  She  may well need additional gastrointestinal evaluation as well.      Jonelle Sidle, MD  Electronically Signed     SGM/MEDQ  D:  10/10/2007  T:  10/11/2007  Job:  [id number]   cc:   Kari Baars, M.D.

## 2011-04-06 NOTE — Assessment & Plan Note (Signed)
[facility]                         ELECTROPHYSIOLOGY OFFICE NOTE   KAMAILE, ZACHOW                        MRN:          [id number]  DATE:10/25/2007                            DOB:          July 31, 1946    HISTORY OF PRESENT ILLNESS:  Ms.  Antonetti is the wife of a dermatologist  who is in [place].  She was hospitalized a couple of weeks ago because  of chest pain associated with T wave abnormalities in the anterior  precordium that were variable in response to nitroglycerin.  The T wave  inversions actually turned out to be old, going back to an  electrocardiogram in December  2007, which I have subsequently reviewed.  While in the hospital, her cardiac enzymes were negative.  Her D. dimer  was negative.  An echocardiogram was done which demonstrated a little  bit of apparently peri-right ventricular fluid.  Her chest pain syndrome  was notable for being worse when she was lying down and better when she  was sitting up and it was felt that she might have pericarditis.   She was subsequently discharged on Protonix, colchicine, Indocin.  Her  symptoms have worsened.  They are now not positional.  The are now not  relieved by sitting up.  They can, in fact, be worse when she wakens in  the morning.  They are clearly much worse with exertion, and they  comprise most of her day.   MEDICATIONS:  Colchicine, Synthroid, Topamax, Zoloft, Protonix and  aspirin.   PHYSICAL EXAMINATION:  VITAL SIGNS:  Blood pressure 138/80, pulse 88.  Notably, her heart rate a year ago was also close to 100.  HEENT:  No icterus nor xanthoma.  Her neck veins were 7-8 cm.  LUNGS:  Clear.  HEART:  Sounds were regular without murmurs.  The S2 was widely split.  ABDOMEN:  Soft with active bowel sounds.  EXTREMITIES:  Femorals pulses were 2+.  Distal pulses were intact.  There was no clubbing, cyanosis or edema.  NEUROLOGICAL:  Exam was grossly normal.   STUDIES:   Electrocardiogram  dated this evening demonstrated sinus  rhythm at 82 with intervals of 0.13, 0.08, 0.3.  The axis was 45  degrees.  There was a R prime in lead V1.  There was ST segment  depression in lead V2 to V5 which was old as well as in the inferior  leads.  T wave inversions were also noted, but not nearly as prominent.  Review of the electrocardiograms were as noted previously with T wave  normalization with tachycardia following nitroglycerin in Dr. Alver Fisher  office.   IMPRESSION:  1. Persistent chest pain with an abnormal electrocardiogram.  2. Minimal pericardial effusion.  3. Negative enzyme workup for myocardial infarction and pulmonary      embolism during hospitalization.   DISCUSSION:  Ms. Ohlson is having ongoing chest pain with abnormal  electrocardiographic repolarization.  I think the likelihood that this  is coronary disease is low.  However, I do not think that likelihood is  0, not withstanding the symptom complex, and she continues to  have  persistent and increasing discomfort, and now that it is more exertional  in nature.   I have suggested that two alternatives would be worthy of consideration  regarding the presence of coronary disease, and I realize that Dr.  Diona Browner has seen her, but he is unfortunately not available this  evening with whom to consult.  The first would be to undertake a stress  test and the second would be to undertake catheterization.  I am not all  together saying going about the negative predictive value of a stress  test in this situation, and it would be my thought that she needs a  catheterization regardless.  I have reviewed this with her.  However,  she would like to proceed with stress testing, and we will arrange an  Adenosine Myoview for the morning.  She is to keep herself n.p.o., have  no caffeine overnight and she is to present herself to the hospital if  she has significant increase in her chest discomfort syndrome.      Duke Salvia, MD, [facility]  Electronically Signed    SCK/MedQ  DD: 10/25/2007  DT: 10/25/2007  Job #: [id number]   cc:   Kari Baars, M.D.

## 2011-04-06 NOTE — Discharge Summary (Signed)
April Berg, PECORE               ACCOUNT NO.:  [id number]   MEDICAL RECORD NO.:  [id number]          PATIENT TYPE:  INP   LOCATION:  2008                         FACILITY:  [facility]   PHYSICIAN:  Jonelle Sidle, MD DATE OF BIRTH:  [birth year]   DATE OF ADMISSION:  10/10/2007  DATE OF DISCHARGE:  10/11/2007                               DISCHARGE SUMMARY   PROCEDURES:  1. Two-dimensional echocardiogram.  2. Abdominal ultrasound.   PRIMARY DISCHARGE DIAGNOSIS:  1. Epigastric/thoracic pain.  2. Pericardial effusion, question subacute pericarditis.  3. Gastrointestinal reflux.   SECONDARY DIAGNOSES:  1. History of hiatal hernia/gastroesophageal reflux disease.  2. Hypothyroidism.  3. Allergies or intolerances to SULFA, AVELOX, ASPIRIN, NSAIDS, and      NEOMYCIN.   NOTE:  Time to prepare discharge 45 minutes.   HOSPITAL COURSE:  The patient is a 65 year old female with no previous  history of coronary artery disease.  She went to see Dr. Clelia Croft with  thoracic and abdominal pain, and there was concern for a possible  cardiac origin of her symptoms because her EKG was abnormal and she was  having ongoing pain.  She was transferred by EMS to [facility]  for further evaluation and admitted by cardiology.   The patient's cardiac enzymes were negative for MI.  Her chest x-ray  showed possible cardiomegaly, but no acute disease.  An abdominal  ultrasound was ordered because of a consideration for a GI origin of her  symptoms; but, it showed no significant abnormalities.  There was a 3 cm  simple cyst of the mid left kidney and limited visualization of the  pancreatic tail due to overlying bowel gas, but no significant focal  parenchymal abnormality was seen.  The gallbladder had no gallbladder  wall thickening or pericholecystic fluid and the test showed no  gallstones; and, a negative sonographic Murphy sign.  Her pain  continued, but was more mild, and her upper abdomen was  slightly tender.  An echocardiogram was performed, which showed an EF of 60-65% with no  regional wall motion abnormalities and normal left ventricular wall  thickness.  There was a relatively small circumferential pericardial  effusion with a more moderate collection of fluid anteroapical to the  right ventricle and there was evidence of organization in this area, but  no clear evidence of right ventricular compromise.  The possibility of  subacute pericarditis was considered.   The patient was tolerating her symptoms well.  She had a D-dimer  checked, which was less than 0.22; and, an amylase, lipase and TSH were  within normal limits with a TSH of 0.903.  Her other labs including a  CBC and BMET were within normal limits.  Liver function tests and the  CMET were also within normal limits.  She had been on Prilosec prior to  admission, but Dr. Clelia Croft felt this should be changed to Protonix 40 mg  and increased to twice a day, which was done.  She was not treated with  NSAIDS for the pericardial effusion because of her intolerance to them  on a GI basis.  Dr. Diona Browner felt she could be treated with a limited  course of colchicine and followed as an outpatient; however, if her  symptoms do not improve then further evaluation and possible treatment  with steroids can be considered.  He felt that her EKG changes could be  evolutionary changes related to the pericarditis.   The patient had no symptoms of hemodynamic compromise including  dizziness, orthostatic hypotension or hemodynamic instability.  Dr.  Diona Browner felt that she might have had mild pericarditis around the time  of her recent bronchitis and this caused the effusion.  He felt that she  was stable for discharge on medical therapy and with close outpatient  follow up arranged.  If her symptoms worsened or she develops additional  fevers or chills she understands the need to contact us immediately.   DISCHARGE INSTRUCTIONS:    ACTIVITY:  The patient's activity level is to be increased gradually.  She was requested to limit strenuous activity.   DIET:  The patient is to stick to a low-sodium diet.   FOLLOW UP:  The patient is to follow up in our office on October 27, 2007 with an echocardiogram at 8:30 and a stress test at 9:45.  She  understands that if she does not feel up to doing the stress test she  can reschedule it, but, if at all possible, she should keep the  echocardiogram appointment.  She is to follow up with Dr. Diona Browner on  October 30, 2007 at 10 A.M. and she is to see Dr. Clelia Croft as well.   DISCHARGE MEDICATIONS:  1. Synthroid 112 mcg daily.  2. Provera to 40 mg a day.  3. Topamax 50 mg a day.  4. Zoloft 25 mg a day.  5. Protonix 40 mg twice a day.  6. Centrum Silver daily.  7. Tylenol arthritis as prior to admission.  8. Zomig as prior to admission.  9. Calcium with vitamin D as prior to admission.  10.Colchicine 0.6 mg twice a day for 10 days.      Theodore Demark, PA-C      Jonelle Sidle, MD  Electronically Signed    RB/MEDQ  D:  10/11/2007  T:  10/12/2007  Job:  [id number]   cc:   Lupita Raider, M.D.

## 2011-04-06 NOTE — Cardiovascular Report (Signed)
NAME:  April Berg, April Berg               ACCOUNT NO.:  [id number]   MEDICAL RECORD NO.:  [id number]          PATIENT TYPE:  OIB   LOCATION:  2855                         FACILITY:  [facility]   PHYSICIAN:  Bruce R. Juanda Chance, MD, FACCDATE OF BIRTH:  05/08/1946   DATE OF PROCEDURE:  10/27/2007  DATE OF DISCHARGE:                            CARDIAC CATHETERIZATION   CLINICAL HISTORY:  [name] is 65 years old and was recently  hospitalized with chest pain and found to have a small pericardial  effusion by echocardiography and was treated with colchicine.  She  developed recurrent symptoms and was scheduled to see Dr. Diona Browner back  but saw Dr. Graciela Husbands as an add-on and he arranged for her to have a repeat  echo in the office which showed persistence of a very small pericardial  effusion.  He also arrange an adenosine Myoview scan which showed no  evidence of ischemia.  Ejection fraction of 81%.  Because of persistent  symptoms, decision made to reevaluate her further with cardiac  catheterization.  She is married to Dr. Anthonette Legato who is a  dermatologist in [place], [place], and they are both in the  process of moving to [place], [place], where their daughter  lives.   PROCEDURE:  The procedure was performed via the right femoral arteries  and arterial sheath and 5-French preformed coronary catheters.  A front  wall arterial puncture was performed and Omnipaque contrast was used.  Aortic root was performed to rule out an aortic dissection.  A distal  aortogram was performed to rule out renovascular causes for  hypertension.  She tolerated the procedure well and left the laboratory  in satisfactory condition.   RESULTS:  Left main coronary artery:  The left main coronary artery was  free of significant disease.   Left anterior descending artery:  The left anterior descending artery  gave rise to two septal perforators and two diagonal branches.  These  and the LAD proper were free  of significant disease.   Circumflex artery:  The circumflex artery gave rise to a small marginal  branch, a large marginal branch and a posterolateral branch.  These  vessels were free of significant disease.   Right coronary artery:  The right coronary artery was a moderate-sized  vessel, gave rise to two right ventricular branches, a posterior  descending branch and a posterolateral branch.  These vessels were free  of significant disease.   Left ventriculogram:  The left ventriculogram performed in the RAO  projection showed good wall motion with no areas of hypokinesis.  Estimated fraction was 70%.   An aortic root injection was performed in the LAO projection and showed  no aortic dissection and no aortic dilatation and no aortic  insufficiency.   Distal aortogram:  A distal aortogram was performed which showed patent  renal arteries and no significant aortoiliac obstruction.   CONCLUSION:  Normal coronary angiography and normal left ventricular  wall motion.   RECOMMENDATIONS:  Reassurance.  I will discuss with Dr. Diona Browner whether  any further evaluation is needed.      Smitty Cords  R. Juanda Chance, MD, [facility]  Electronically Signed     BRB/MEDQ  D:  10/27/2007  T:  10/27/2007  Job:  [id number]   cc:   Kari Baars, M.D.  Cardiopulmonary Lab  Duke Salvia, MD, [facility]

## 2011-04-06 NOTE — Assessment & Plan Note (Signed)
[facility]                            CARDIOLOGY OFFICE NOTE   April Berg, April Berg                        MRN:          [id number]  DATE:10/26/2007                            DOB:          1946/01/10    PRIMARY CARE PHYSICIAN:  Kari Baars, M.D.   I saw April Berg most recently during a hospital admission back on the  16th through the 19th of November. She presented at that time with  epigastric and thoracic pain and was ultimately noted to have a small  pericardial effusion as well as findings of fairly nonspecific ST-T wave  changes on electrocardiography. She had an unrevealing abdominal  ultrasound as well as amylase and lipase and had negative cardiac  markers as well as a d-dimer level. We postulated subacute pericarditis  and given a substantial intolerance to nonsteroidal anti-inflammatory  drugs documented in the past, she was treated with a limited course of  colchicine as well as initiation of Protonix 40 mg twice a day. It was  anticipated that she would have a followup outpatient Myoview and then  see me back in the office.   April Berg was seen yesterday as an add-on to the clinic by Dr. Graciela Husbands in  my absence with progressive chest pain symptoms. I spoke with Dr. Graciela Husbands  and reviewed his note. She has described a more consistent throbbing  discomfort in her chest that is nearly constant, although does wax and  wane. She cannot think of a specific trigger, but does notice it when  she is active. She has not been bothered by any recent abdominal  discomfort. Dr. Graciela Husbands had her undergo a repeat echocardiogram in the  office yesterday which demonstrated a continued small pericardial  effusion, although with no compromise to the right ventricle and a  continued normal ejection fraction of 65% without wall motion  abnormalities. Aortic root size was normal as well. Dr. Graciela Husbands arranged  for her to undergo an adenosine Myoview in the office today  and the  report on this study reveals an ejection fraction of 81% with evidence  of breast attenuation without any frank scar or ischemia and nonspecific  ST changes in the setting of adenosine.   I spoke with both April Berg and also her husband, April Berg, by cell  phone and updated them on the results of this study. Dr. Graciela Husbands had also  mentioned to both of them the possibility of proceeding on to a  diagnostic cardiac catheterization to clearly exclude obstructive  coronary disease given her persistent/progressive symptoms. I discussed  this again with both of them and the inclination is to go ahead and  proceed with the diagnostic cardiac catheterization tomorrow as an  outpatient. She had labs and chest x-ray done in the office today and is  being scheduled for a diagnostic cardiac catheterization with aortic  root injection tomorrow as an outpatient with Dr. Juanda Chance. Hopefully, we  will obtain reassuring information at that time.     Jonelle Sidle, MD  Electronically Signed   SGM/MedQ  DD: 10/26/2007  DT:  10/26/2007  Job #: [id number]   cc:   Kari Baars, M.D.  Duke Salvia, MD, [facility]

## 2011-04-06 NOTE — Assessment & Plan Note (Signed)
[facility]                            CARDIOLOGY OFFICE NOTE   LEKITA, KEREKES                        MRN:          [id number]  DATE:12/01/2007                            DOB:          03/15/1946    PRIMARY CARE PHYSICIAN:  Kari Baars, M.D.   REASON FOR VISIT:  Postcatheterization followup.   HISTORY OF PRESENT ILLNESS:  [name] comes in today with her husband  for a routine followup.  Her history is outlined in my dictated note  from October 26, 2007.  The patient was seen around that time in my  absence by Dr. Graciela Husbands with recurrent symptoms of chest throbbing.  She  had undergone a repeat echocardiogram demonstrating a small pericardial  effusion without compromise and normal ejection fraction of 65% with  normal aortic root size.  Dr. Graciela Husbands did arrange for a follow up  adenosine Myoview which demonstrated mild breast attenuation with no  frank scar or ischemia and ejection fraction of 81%.  Based on her  progressive symptoms,  he also recommended a follow up cardiac  catheterization and I was ultimately able to discuss this with the  patient and her husband.  This procedure was performed by Dr. Juanda Chance on  October 27, 2007 and revealed normal coronary arteries with normal  aortic root injection and normal left ventricular contraction.  The  patient and her husband were both quite reassured by this and she  presents today stating that generally she feels much better.  She had  some left shoulder pain and is undergoing some trigger point injections  in the near future to address this.  Her electrocardiogram today shows  normal sinus rhythm with nonspecific ST/T wave changes.  She overall  feels much better about the situation.  We talked about general risk  factor modification strategies and I know that she is observing her  cholesterol at this time following some diet adjustments.  I see that  she had an LDL of 179 back in December.  She  has preferred to hold off  on statin therapy for the time being, although seems to be willing to  consider it if her levels remain elevated next time.   ALLERGIES:  NONSTEROIDAL ANTIINFLAMMATORY DRUGS.   PRESENT MEDICATIONS:  1. Synthroid 112 mcg p.o. daily.  2. Protonix 20 mg p.o. daily.  3. Zoloft 25 mg p.o. daily.  4. Topamax 50 mg p.o. daily.  5. Allegra 180 mg p.o. p.r.n.  6  Zomig 5 mg p.o. p.r.n.  1. Multivitamin daily.  2. Calcium daily.  3. Vitamin D daily.  4. Hydrocodone p.r.n.   REVIEW OF SYSTEMS:  As described in history of present illness and  negative.   PHYSICAL EXAMINATION:  VITAL SIGNS:  Blood pressure is 132/77, heart  rate is 85, weight 151 pounds.  Otherwise the patient not examined.  We  spent the visit discussing her testing and other recommendations  regarding lipid management.   IMPRESSION/RECOMMENDATIONS:  1. Reassuring cardiac evaluation following presentation with chest      pain.  I still frankly wonder whether  she had an element of      pericarditis leading to this.  She and her husband are both much      more reassured following the cardiac catheterization.  I do not      anticipate any further cardiac studies at this point.  She will      continue to see Dr. Clelia Croft for general risk factor modification.  I      did speak with her today somewhat about aggressive lipid control as      it sounds like statin therapy was offered to her originally.  I      think this would be a reasonable option and she agrees to at least      consider the matter further if her  LDL remains elevated over the      next 3 months.  2. Cardiology followup can be p.r.n.     Jonelle Sidle, MD  Electronically Signed    SGM/MedQ  DD: 12/01/2007  DT: 12/02/2007  Job #: [id number]   cc:   Kari Baars, M.D.

## 2011-07-28 ENCOUNTER — Other Ambulatory Visit: Payer: Self-pay | Admitting: Internal Medicine

## 2011-07-28 DIAGNOSIS — Z1231 Encounter for screening mammogram for malignant neoplasm of breast: Secondary | ICD-10-CM

## 2011-08-31 LAB — BASIC METABOLIC PANEL
BUN: 17
Chloride: 108
GFR calc Af Amer: >60
Potassium: 3.9

## 2011-08-31 LAB — TSH: TSH: 0.903

## 2011-08-31 LAB — COMPREHENSIVE METABOLIC PANEL
ALT: 21
Albumin: 4.1
Calcium: 9.3
GFR calc Af Amer: >60
Glucose, Bld: 96
Potassium: 3.8
Sodium: 137
Total Protein: 6.7

## 2011-08-31 LAB — CBC
Hemoglobin: 13.5
MCHC: 34.4
RBC: 4.49
WBC: 8.7

## 2011-08-31 LAB — PROTIME-INR
Prothrombin Time: 13
Prothrombin Time: 13.4

## 2011-08-31 LAB — D-DIMER, QUANTITATIVE: D-Dimer, Quant: <0.22

## 2011-08-31 LAB — CARDIAC PANEL(CRET KIN+CKTOT+MB+TROPI)
Relative Index: 2.3
Total CK: 112
Troponin I: 0.01

## 2011-08-31 LAB — AMYLASE: Amylase: 27

## 2011-08-31 LAB — CK TOTAL AND CKMB (NOT AT ARMC)
CK, MB: 3.1
Relative Index: 2.5
Total CK: 123

## 2011-09-20 ENCOUNTER — Ambulatory Visit
Admission: RE | Admit: 2011-09-20 | Discharge: 2011-09-20 | Disposition: A | Discharge status: Home / Self Care | Payer: BC Managed Care – PPO | Source: Ambulatory Visit | Attending: Internal Medicine | Admitting: Internal Medicine

## 2011-09-20 DIAGNOSIS — Z1231 Encounter for screening mammogram for malignant neoplasm of breast: Secondary | ICD-10-CM

## 2011-12-08 ENCOUNTER — Encounter (HOSPITAL_COMMUNITY): Payer: Self-pay | Admitting: Anesthesiology

## 2011-12-08 ENCOUNTER — Other Ambulatory Visit: Payer: Self-pay

## 2011-12-08 ENCOUNTER — Other Ambulatory Visit: Payer: Self-pay | Admitting: Orthopedic Surgery

## 2011-12-08 ENCOUNTER — Encounter (HOSPITAL_COMMUNITY)
Admission: EM | Disposition: A | Discharge status: Home / Self Care | Payer: Self-pay | Source: Ambulatory Visit | Attending: Orthopedic Surgery

## 2011-12-08 ENCOUNTER — Inpatient Hospital Stay (HOSPITAL_COMMUNITY)
Admission: EM | Admit: 2011-12-08 | Discharge: 2011-12-10 | DRG: 865 | Disposition: A | Discharge status: Home / Self Care | Payer: BC Managed Care – PPO | Source: Ambulatory Visit | Attending: Orthopedic Surgery | Admitting: Orthopedic Surgery

## 2011-12-08 ENCOUNTER — Encounter (HOSPITAL_COMMUNITY): Payer: Self-pay | Admitting: *Deleted

## 2011-12-08 ENCOUNTER — Inpatient Hospital Stay (HOSPITAL_COMMUNITY): Payer: BC Managed Care – PPO

## 2011-12-08 ENCOUNTER — Inpatient Hospital Stay (HOSPITAL_COMMUNITY): Payer: BC Managed Care – PPO | Admitting: Anesthesiology

## 2011-12-08 DIAGNOSIS — Z79899 Other long term (current) drug therapy: Secondary | ICD-10-CM

## 2011-12-08 DIAGNOSIS — Z7982 Long term (current) use of aspirin: Secondary | ICD-10-CM

## 2011-12-08 DIAGNOSIS — F3289 Other specified depressive episodes: Secondary | ICD-10-CM | POA: Diagnosis present

## 2011-12-08 DIAGNOSIS — E039 Hypothyroidism, unspecified: Secondary | ICD-10-CM | POA: Diagnosis present

## 2011-12-08 DIAGNOSIS — K219 Gastro-esophageal reflux disease without esophagitis: Secondary | ICD-10-CM | POA: Diagnosis present

## 2011-12-08 DIAGNOSIS — F329 Major depressive disorder, single episode, unspecified: Secondary | ICD-10-CM | POA: Diagnosis present

## 2011-12-08 DIAGNOSIS — R112 Nausea with vomiting, unspecified: Secondary | ICD-10-CM | POA: Diagnosis not present

## 2011-12-08 DIAGNOSIS — M1990 Unspecified osteoarthritis, unspecified site: Secondary | ICD-10-CM | POA: Diagnosis present

## 2011-12-08 DIAGNOSIS — G43909 Migraine, unspecified, not intractable, without status migrainosus: Secondary | ICD-10-CM | POA: Diagnosis present

## 2011-12-08 DIAGNOSIS — Z96698 Presence of other orthopedic joint implants: Secondary | ICD-10-CM

## 2011-12-08 DIAGNOSIS — M47812 Spondylosis without myelopathy or radiculopathy, cervical region: Principal | ICD-10-CM | POA: Diagnosis present

## 2011-12-08 DIAGNOSIS — F411 Generalized anxiety disorder: Secondary | ICD-10-CM | POA: Diagnosis present

## 2011-12-08 HISTORY — DX: Hypothyroidism, unspecified: E03.9

## 2011-12-08 HISTORY — DX: Gastro-esophageal reflux disease without esophagitis: K21.9

## 2011-12-08 HISTORY — PX: ANTERIOR CERVICAL DECOMP/DISCECTOMY FUSION: SHX1161

## 2011-12-08 HISTORY — DX: Adverse effect of unspecified anesthetic, initial encounter: T41.45XA

## 2011-12-08 HISTORY — DX: Other complications of anesthesia, initial encounter: T88.59XA

## 2011-12-08 HISTORY — DX: Unspecified osteoarthritis, unspecified site: M19.90

## 2011-12-08 LAB — URINALYSIS, ROUTINE W REFLEX MICROSCOPIC
Glucose, UA: NEGATIVE mg/dL
Ketones, ur: NEGATIVE mg/dL
Nitrite: NEGATIVE
Specific Gravity, Urine: 1.019 (ref 1.005–1.030)
pH: 5 (ref 5.0–8.0)

## 2011-12-08 LAB — COMPREHENSIVE METABOLIC PANEL
ALT: 14 U/L (ref 0–35)
AST: 11 U/L (ref 0–37)
Albumin: 3.8 g/dL (ref 3.5–5.2)
Alkaline Phosphatase: 83 U/L (ref 39–117)
BUN: 23 mg/dL (ref 6–23)
Chloride: 108 mEq/L (ref 96–112)
Potassium: 4.9 mEq/L (ref 3.5–5.1)
Sodium: 141 mEq/L (ref 135–145)
Total Bilirubin: 0.4 mg/dL (ref 0.3–1.2)
Total Protein: 6.6 g/dL (ref 6.0–8.3)

## 2011-12-08 LAB — TYPE AND SCREEN

## 2011-12-08 LAB — SURGICAL PCR SCREEN: MRSA, PCR: NEGATIVE

## 2011-12-08 LAB — PROTIME-INR: INR: 1.02 (ref 0.00–1.49)

## 2011-12-08 LAB — CBC
HCT: 43.6 % (ref 36.0–46.0)
Hemoglobin: 14.7 g/dL (ref 12.0–15.0)
MCHC: 33.7 g/dL (ref 30.0–36.0)
RDW: 13.2 % (ref 11.5–15.5)
WBC: 11.1 10*3/uL — ABNORMAL HIGH (ref 4.0–10.5)

## 2011-12-08 LAB — DIFFERENTIAL
Basophils Relative: 0 % (ref 0–1)
Eosinophils Absolute: 0.1 10*3/uL (ref 0.0–0.7)
Eosinophils Relative: 1 % (ref 0–5)
Lymphs Abs: 1.9 10*3/uL (ref 0.7–4.0)
Monocytes Absolute: 0.8 10*3/uL (ref 0.1–1.0)
Monocytes Relative: 7 % (ref 3–12)

## 2011-12-08 LAB — URINE MICROSCOPIC-ADD ON

## 2011-12-08 LAB — ABO/RH: ABO/RH(D): A POS

## 2011-12-08 LAB — APTT: aPTT: 29 seconds (ref 24–37)

## 2011-12-08 SURGERY — ANTERIOR CERVICAL DECOMPRESSION/DISCECTOMY FUSION 3 LEVELS
Anesthesia: General | Site: Neck | Wound class: Clean

## 2011-12-08 MED ORDER — DULOXETINE HCL 30 MG PO CPEP
30.0000 mg | ORAL_CAPSULE | Freq: Every day | ORAL | Status: DC
  Administered 2011-12-10: 30 mg via ORAL
  Filled 2011-12-08 (×3): qty 1

## 2011-12-08 MED ORDER — VECURONIUM BROMIDE 10 MG IV SOLR
INTRAVENOUS | Status: DC | PRN
  Administered 2011-12-08: 3 mg via INTRAVENOUS

## 2011-12-08 MED ORDER — CALCIUM CARBONATE 1250 (500 CA) MG PO TABS
1250.0000 mg | ORAL_TABLET | Freq: Every day | ORAL | Status: DC
  Administered 2011-12-10: 1250 mg via ORAL
  Filled 2011-12-08 (×2): qty 1

## 2011-12-08 MED ORDER — ACETAMINOPHEN 650 MG RE SUPP: 650.0000 mg | RECTAL | Status: DC | PRN

## 2011-12-08 MED ORDER — OXYCODONE-ACETAMINOPHEN 5-325 MG PO TABS
1.0000 | ORAL_TABLET | ORAL | Status: DC | PRN
  Administered 2011-12-09 – 2011-12-10 (×2): 2 via ORAL
  Filled 2011-12-08 (×2): qty 2

## 2011-12-08 MED ORDER — MEPERIDINE HCL 25 MG/ML IJ SOLN: 6.2500 mg | INTRAMUSCULAR | Status: DC | PRN

## 2011-12-08 MED ORDER — PROPOFOL 10 MG/ML IV EMUL
INTRAVENOUS | Status: DC | PRN
  Administered 2011-12-08: 120 mg via INTRAVENOUS
  Administered 2011-12-08: 30 mg via INTRAVENOUS

## 2011-12-08 MED ORDER — HYDROXYZINE HCL 50 MG/ML IM SOLN
50.0000 mg | INTRAMUSCULAR | Status: DC | PRN
  Administered 2011-12-08: 50 mg via INTRAMUSCULAR
  Filled 2011-12-08 (×3): qty 1

## 2011-12-08 MED ORDER — PHENOL 1.4 % MT LIQD
1.0000 | OROMUCOSAL | Status: DC | PRN
  Filled 2011-12-08: qty 177

## 2011-12-08 MED ORDER — SODIUM CHLORIDE 0.9 % IJ SOLN
3.0000 mL | Freq: Two times a day (BID) | INTRAMUSCULAR | Status: DC
  Administered 2011-12-08 – 2011-12-09 (×2): 3 mL via INTRAVENOUS

## 2011-12-08 MED ORDER — THROMBIN 20000 UNITS EX KIT
PACK | CUTANEOUS | Status: DC | PRN
  Administered 2011-12-08: 20000 [IU] via TOPICAL

## 2011-12-08 MED ORDER — LEVOTHYROXINE SODIUM 125 MCG PO TABS
125.0000 ug | ORAL_TABLET | Freq: Every day | ORAL | Status: DC
  Administered 2011-12-09 – 2011-12-10 (×2): 125 ug via ORAL
  Filled 2011-12-08 (×3): qty 1

## 2011-12-08 MED ORDER — PANTOPRAZOLE SODIUM 40 MG PO TBEC
40.0000 mg | DELAYED_RELEASE_TABLET | Freq: Every day | ORAL | Status: DC
  Administered 2011-12-10: 40 mg via ORAL
  Filled 2011-12-08 (×2): qty 1

## 2011-12-08 MED ORDER — MORPHINE SULFATE 2 MG/ML IJ SOLN: 0.0500 mg/kg | INTRAMUSCULAR | Status: DC | PRN

## 2011-12-08 MED ORDER — ESMOLOL HCL 10 MG/ML IV SOLN
INTRAVENOUS | Status: DC | PRN
  Administered 2011-12-08: 20 mg via INTRAVENOUS
  Administered 2011-12-08: 30 mg via INTRAVENOUS

## 2011-12-08 MED ORDER — HYDROMORPHONE HCL PF 1 MG/ML IJ SOLN: 0.2500 mg | INTRAMUSCULAR | Status: DC | PRN

## 2011-12-08 MED ORDER — MIDAZOLAM HCL 2 MG/2ML IJ SOLN
1.0000 mg | INTRAMUSCULAR | Status: DC | PRN
  Administered 2011-12-08: 2 mg via INTRAVENOUS

## 2011-12-08 MED ORDER — DIAZEPAM 5 MG PO TABS
5.0000 mg | ORAL_TABLET | Freq: Four times a day (QID) | ORAL | Status: DC | PRN
  Administered 2011-12-09: 5 mg via ORAL
  Filled 2011-12-08 (×2): qty 1

## 2011-12-08 MED ORDER — LACTATED RINGERS IV SOLN
INTRAVENOUS | Status: DC
  Administered 2011-12-08: 15:00:00 via INTRAVENOUS

## 2011-12-08 MED ORDER — CEFAZOLIN SODIUM-DEXTROSE 2-3 GM-% IV SOLR
2.0000 g | INTRAVENOUS | Status: AC
  Administered 2011-12-08: 2 g via INTRAVENOUS
  Filled 2011-12-08: qty 50

## 2011-12-08 MED ORDER — MIDAZOLAM HCL 2 MG/2ML IJ SOLN
INTRAMUSCULAR | Status: AC
  Filled 2011-12-08: qty 2

## 2011-12-08 MED ORDER — POVIDONE-IODINE 7.5 % EX SOLN
Freq: Once | CUTANEOUS | Status: DC
  Filled 2011-12-08: qty 118

## 2011-12-08 MED ORDER — ROCURONIUM BROMIDE 100 MG/10ML IV SOLN
INTRAVENOUS | Status: DC | PRN
  Administered 2011-12-08: 50 mg via INTRAVENOUS

## 2011-12-08 MED ORDER — HYDROXYZINE HCL 50 MG PO TABS
50.0000 mg | ORAL_TABLET | ORAL | Status: DC | PRN
  Filled 2011-12-08 (×3): qty 1

## 2011-12-08 MED ORDER — ACETAMINOPHEN 325 MG PO TABS: 650.0000 mg | ORAL_TABLET | ORAL | Status: DC | PRN

## 2011-12-08 MED ORDER — ONDANSETRON HCL 4 MG/2ML IJ SOLN
4.0000 mg | Freq: Four times a day (QID) | INTRAMUSCULAR | Status: DC | PRN
  Administered 2011-12-08 – 2011-12-10 (×5): 4 mg via INTRAVENOUS
  Filled 2011-12-08 (×6): qty 2

## 2011-12-08 MED ORDER — HYDROXYZINE HCL 25 MG PO TABS
25.0000 mg | ORAL_TABLET | Freq: Three times a day (TID) | ORAL | Status: DC | PRN
  Filled 2011-12-08: qty 1

## 2011-12-08 MED ORDER — MENTHOL 3 MG MT LOZG: 1.0000 | LOZENGE | OROMUCOSAL | Status: DC | PRN

## 2011-12-08 MED ORDER — POTASSIUM CHLORIDE IN NACL 20-0.9 MEQ/L-% IV SOLN
INTRAVENOUS | Status: DC
  Administered 2011-12-08 – 2011-12-09 (×3): via INTRAVENOUS
  Administered 2011-12-10: 1000 mL via INTRAVENOUS
  Filled 2011-12-08 (×5): qty 1000

## 2011-12-08 MED ORDER — ZOLPIDEM TARTRATE 5 MG PO TABS: 5.0000 mg | ORAL_TABLET | Freq: Every evening | ORAL | Status: DC | PRN

## 2011-12-08 MED ORDER — CEFAZOLIN SODIUM 1-5 GM-% IV SOLN
1.0000 g | Freq: Three times a day (TID) | INTRAVENOUS | Status: AC
  Administered 2011-12-08 – 2011-12-09 (×2): 1 g via INTRAVENOUS
  Filled 2011-12-08 (×2): qty 50

## 2011-12-08 MED ORDER — THERA M PLUS PO TABS
1.0000 | ORAL_TABLET | Freq: Every day | ORAL | Status: DC
  Filled 2011-12-08 (×2): qty 1

## 2011-12-08 MED ORDER — TRAVOPROST (BAK FREE) 0.004 % OP SOLN
1.0000 [drp] | Freq: Every day | OPHTHALMIC | Status: DC
  Filled 2011-12-08: qty 2.5

## 2011-12-08 MED ORDER — LACTATED RINGERS IV SOLN
INTRAVENOUS | Status: DC | PRN
  Administered 2011-12-08 (×3): via INTRAVENOUS

## 2011-12-08 MED ORDER — SODIUM CHLORIDE 0.9 % IV SOLN: 250.0000 mL | INTRAVENOUS | Status: DC

## 2011-12-08 MED ORDER — ONDANSETRON HCL 4 MG/2ML IJ SOLN
4.0000 mg | Freq: Once | INTRAMUSCULAR | Status: AC | PRN
  Administered 2011-12-08: 4 mg via INTRAVENOUS

## 2011-12-08 MED ORDER — TRAVOPROST (BAK FREE) 0.004 % OP SOLN: 1.0000 [drp] | Freq: Every day | OPHTHALMIC | Status: DC

## 2011-12-08 MED ORDER — SODIUM CHLORIDE 0.9 % IJ SOLN: 3.0000 mL | INTRAMUSCULAR | Status: DC | PRN

## 2011-12-08 MED ORDER — CALCIUM CARBONATE 600 MG PO TABS: 1500.0000 mg | ORAL_TABLET | Freq: Every day | ORAL | Status: DC

## 2011-12-08 MED ORDER — FENTANYL CITRATE 0.05 MG/ML IJ SOLN
INTRAMUSCULAR | Status: DC | PRN
  Administered 2011-12-08: 50 ug via INTRAVENOUS
  Administered 2011-12-08: 150 ug via INTRAVENOUS
  Administered 2011-12-08 (×4): 50 ug via INTRAVENOUS
  Administered 2011-12-08: 100 ug via INTRAVENOUS
  Administered 2011-12-08: 50 ug via INTRAVENOUS
  Administered 2011-12-08 (×2): 100 ug via INTRAVENOUS

## 2011-12-08 MED ORDER — MUPIROCIN 2 % EX OINT
TOPICAL_OINTMENT | CUTANEOUS | Status: AC
  Administered 2011-12-08: 1 via NASAL
  Filled 2011-12-08: qty 22

## 2011-12-08 MED ORDER — BUPIVACAINE-EPINEPHRINE 0.25% -1:200000 IJ SOLN
INTRAMUSCULAR | Status: DC | PRN
  Administered 2011-12-08: 2 mL

## 2011-12-08 MED ORDER — MORPHINE SULFATE 4 MG/ML IJ SOLN
2.0000 mg | INTRAMUSCULAR | Status: DC | PRN
  Administered 2011-12-08 – 2011-12-10 (×14): 2 mg via INTRAVENOUS
  Filled 2011-12-08 (×14): qty 1

## 2011-12-08 MED ORDER — PROMETHAZINE HCL 25 MG/ML IJ SOLN
12.5000 mg | INTRAMUSCULAR | Status: DC | PRN
  Administered 2011-12-09 – 2011-12-10 (×6): 25 mg via INTRAVENOUS
  Filled 2011-12-08 (×6): qty 1

## 2011-12-08 MED ORDER — VITAMIN D (ERGOCALCIFEROL) 1.25 MG (50000 UNIT) PO CAPS
50000.0000 [IU] | ORAL_CAPSULE | ORAL | Status: DC
  Filled 2011-12-08: qty 1

## 2011-12-08 MED ORDER — TOPIRAMATE 25 MG PO TABS
75.0000 mg | ORAL_TABLET | Freq: Every day | ORAL | Status: DC
  Administered 2011-12-10: 75 mg via ORAL
  Filled 2011-12-08 (×3): qty 3

## 2011-12-08 SURGICAL SUPPLY — 72 items
BENZOIN TINCTURE PRP APPL 2/3 (GAUZE/BANDAGES/DRESSINGS) ×2 IMPLANT
BIT DRILL NEURO 2X3.1 SFT TUCH (MISCELLANEOUS) ×1 IMPLANT
BLADE LONG MED 31X9 (MISCELLANEOUS) IMPLANT
BLADE SURG 15 STRL LF DISP TIS (BLADE) ×1 IMPLANT
BLADE SURG 15 STRL SS (BLADE) ×1
BLADE SURG ROTATE 9660 (MISCELLANEOUS) IMPLANT
BUR NEURO DRILL SOFT 3.0X3.8M (BURR) ×2 IMPLANT
CARTRIDGE OIL MAESTRO DRILL (MISCELLANEOUS) ×1 IMPLANT
CLOTH BEACON ORANGE TIMEOUT ST (SAFETY) ×2 IMPLANT
COLLAR CERV LO CONTOUR FIRM DE (SOFTGOODS) IMPLANT
COLLAR CERV SM MED DENS (SOFTGOODS) ×2 IMPLANT
CORDS BIPOLAR (ELECTRODE) ×2 IMPLANT
COVER SURGICAL LIGHT HANDLE (MISCELLANEOUS) ×2 IMPLANT
CRADLE DONUT ADULT HEAD (MISCELLANEOUS) ×2 IMPLANT
DIFFUSER DRILL AIR PNEUMATIC (MISCELLANEOUS) ×2 IMPLANT
DRAIN JACKSON RD 7FR 3/32 (WOUND CARE) IMPLANT
DRAPE C-ARM 42X72 X-RAY (DRAPES) ×2 IMPLANT
DRAPE POUCH INSTRU U-SHP 10X18 (DRAPES) ×2 IMPLANT
DRAPE SURG 17X23 STRL (DRAPES) ×8 IMPLANT
DRILL NEURO 2X3.1 SOFT TOUCH (MISCELLANEOUS) ×2
DURAPREP 26ML APPLICATOR (WOUND CARE) ×2 IMPLANT
ELECT COATED BLADE 2.86 ST (ELECTRODE) ×2 IMPLANT
ELECT REM PT RETURN 9FT ADLT (ELECTROSURGICAL) ×2
ELECTRODE REM PT RTRN 9FT ADLT (ELECTROSURGICAL) ×1 IMPLANT
EVACUATOR SILICONE 100CC (DRAIN) IMPLANT
GAUZE SPONGE 4X4 16PLY XRAY LF (GAUZE/BANDAGES/DRESSINGS) IMPLANT
GLOVE BIO SURGEON STRL SZ8 (GLOVE) ×4 IMPLANT
GLOVE BIOGEL PI IND STRL 8 (GLOVE) ×2 IMPLANT
GLOVE BIOGEL PI INDICATOR 8 (GLOVE) ×2
GOWN STRL NON-REIN LRG LVL3 (GOWN DISPOSABLE) ×4 IMPLANT
GOWN STRL REIN XL XLG (GOWN DISPOSABLE) ×2 IMPLANT
INTERLOCK LRDTC CRVCL VBR 6MM (Bone Implant) ×3 IMPLANT
IV CATH 14GX2 1/4 (CATHETERS) ×2 IMPLANT
KIT BASIN OR (CUSTOM PROCEDURE TRAY) ×2 IMPLANT
KIT ROOM TURNOVER OR (KITS) ×2 IMPLANT
LORDOTIC CERVICAL VBR 6MM SM (Bone Implant) ×6 IMPLANT
MANIFOLD NEPTUNE II (INSTRUMENTS) IMPLANT
NEEDLE 27GAX1X1/2 (NEEDLE) ×2 IMPLANT
NEEDLE SPNL 20GX3.5 QUINCKE YW (NEEDLE) ×2 IMPLANT
NS IRRIG 1000ML POUR BTL (IV SOLUTION) ×2 IMPLANT
OIL CARTRIDGE MAESTRO DRILL (MISCELLANEOUS) ×2
PACK ORTHO CERVICAL (CUSTOM PROCEDURE TRAY) ×2 IMPLANT
PAD ARMBOARD 7.5X6 YLW CONV (MISCELLANEOUS) ×4 IMPLANT
PATTIES SURGICAL .5 X.5 (GAUZE/BANDAGES/DRESSINGS) IMPLANT
PATTIES SURGICAL .5 X1 (DISPOSABLE) IMPLANT
PIN DISTRACTION 14MM (PIN) ×4 IMPLANT
PLATE VECTRA 45MM (Plate) ×2 IMPLANT
PUTTY BONE DBX 5CC MIX (Putty) ×2 IMPLANT
SCREW 4.0X14MM (Screw) ×4 IMPLANT
SCREW BN 14X4XSLF DRL VA SLF (Screw) ×4 IMPLANT
SCREW RETAINER 12 (Screw) ×4 IMPLANT
SCREW SELF DRILLING 12MM (Screw) ×8 IMPLANT
SPONGE GAUZE 4X4 12PLY (GAUZE/BANDAGES/DRESSINGS) ×2 IMPLANT
SPONGE GAUZE 4X4 STERILE 39 (GAUZE/BANDAGES/DRESSINGS) ×2 IMPLANT
SPONGE INTESTINAL PEANUT (DISPOSABLE) ×4 IMPLANT
SPONGE SURGIFOAM ABS GEL 100 (HEMOSTASIS) ×2 IMPLANT
STRIP CLOSURE SKIN 1/2X4 (GAUZE/BANDAGES/DRESSINGS) ×2 IMPLANT
SURGIFLO TRUKIT (HEMOSTASIS) IMPLANT
SUT MNCRL AB 4-0 PS2 18 (SUTURE) IMPLANT
SUT SILK 4 0 (SUTURE)
SUT SILK 4-0 18XBRD TIE 12 (SUTURE) IMPLANT
SUT VIC AB 2-0 CT2 18 VCP726D (SUTURE) ×2 IMPLANT
SYR BULB IRRIGATION 50ML (SYRINGE) ×2 IMPLANT
SYR CONTROL 10ML LL (SYRINGE) ×2 IMPLANT
TAPE CLOTH 4X10 WHT NS (GAUZE/BANDAGES/DRESSINGS) ×2 IMPLANT
TAPE CLOTH SURG 4X10 WHT LF (GAUZE/BANDAGES/DRESSINGS) ×2 IMPLANT
TAPE UMBILICAL COTTON 1/8X30 (MISCELLANEOUS) ×2 IMPLANT
TOWEL OR 17X24 6PK STRL BLUE (TOWEL DISPOSABLE) ×2 IMPLANT
TOWEL OR 17X26 10 PK STRL BLUE (TOWEL DISPOSABLE) ×2 IMPLANT
TRAY FOLEY CATH 14FR (SET/KITS/TRAYS/PACK) ×2 IMPLANT
WATER STERILE IRR 1000ML POUR (IV SOLUTION) IMPLANT
YANKAUER SUCT BULB TIP NO VENT (SUCTIONS) ×2 IMPLANT

## 2011-12-08 NOTE — Anesthesia Postprocedure Evaluation (Signed)
  Anesthesia Post-op Note  Patient: April Berg  Procedure(s) Performed:  ANTERIOR CERVICAL DECOMPRESSION/DISCECTOMY FUSION 3 LEVELS - C 3-6 ACDF  Patient Location: PACU  Anesthesia Type: General  Level of Consciousness: awake  Airway and Oxygen Therapy: Patient Spontanous Breathing  Post-op Pain: mild  Post-op Assessment: Post-op Vital signs reviewed  Post-op Vital Signs: stable  Complications: No apparent anesthesia complications

## 2011-12-08 NOTE — Anesthesia Preprocedure Evaluation (Addendum)
Anesthesia Evaluation  Patient identified by MRN, date of birth, ID band Patient awake    Reviewed: Allergy & Precautions, H&P , NPO status , Patient's Chart, lab work & pertinent test results  Airway Mallampati: I TM Distance: >3 FB Neck ROM: Full    Dental  (+) Teeth Intact and Dental Advisory Given   Pulmonary neg pulmonary ROS,    Pulmonary exam normal       Cardiovascular neg cardio ROS     Neuro/Psych  Headaches, Anxiety Depression    GI/Hepatic Neg liver ROS, GERD-  Medicated and Controlled,  Endo/Other  Hypothyroidism   Renal/GU negative Renal ROS  Genitourinary negative   Musculoskeletal  (+) Arthritis -, Osteoarthritis,    Abdominal Normal abdominal exam  (+)   Peds  Hematology negative hematology ROS (+)   Anesthesia Other Findings   Reproductive/Obstetrics negative OB ROS                          Anesthesia Physical Anesthesia Plan  ASA: II  Anesthesia Plan: General   Post-op Pain Management:    Induction: Intravenous  Airway Management Planned: Oral ETT  Additional Equipment:   Intra-op Plan:   Post-operative Plan: Extubation in OR  Informed Consent: I have reviewed the patients History and Physical, chart, labs and discussed the procedure including the risks, benefits and alternatives for the proposed anesthesia with the patient or authorized representative who has indicated his/her understanding and acceptance.     Plan Discussed with: CRNA and Surgeon  Anesthesia Plan Comments:         Anesthesia Quick Evaluation

## 2011-12-08 NOTE — OR Nursing (Signed)
Sleepy yet rouses to name call and follows commands/ toloerating po flds well

## 2011-12-08 NOTE — Transfer of Care (Signed)
Immediate Anesthesia Transfer of Care Note  Patient: April Berg  Procedure(s) Performed:  ANTERIOR CERVICAL DECOMPRESSION/DISCECTOMY FUSION 3 LEVELS - C 3-6 ACDF  Patient Location: PACU  Anesthesia Type: General  Level of Consciousness: awake  Airway & Oxygen Therapy: Patient Spontanous Breathing and Patient connected to nasal cannula oxygen  Post-op Assessment: Report given to PACU RN and Post -op Vital signs reviewed and stable  Post vital signs: Reviewed and stable Filed Vitals:   12/08/11 1220  BP: 153/85  Pulse: 87  Temp: 36.5 C  Resp: 18    Complications: No apparent anesthesia complications

## 2011-12-08 NOTE — Preoperative (Signed)
Beta Blockers   Reason not to administer Beta Blockers:Not Applicable 

## 2011-12-08 NOTE — Anesthesia Procedure Notes (Signed)
Procedure Name: Intubation Date/Time: 12/08/2011 3:37 PM Performed by: Delbert Harness Pre-anesthesia Checklist: Patient identified, Timeout performed, Emergency Drugs available, Suction available and Patient being monitored Patient Re-evaluated:Patient Re-evaluated prior to inductionOxygen Delivery Method: Circle System Utilized Preoxygenation: Pre-oxygenation with 100% oxygen Intubation Type: IV induction Ventilation: Mask ventilation without difficulty Laryngoscope Size: Mac and 4 Grade View: Grade II Tube type: Oral Tube size: 7.0 mm Number of attempts: 1 Airway Equipment and Method: stylet Placement Confirmation: ETT inserted through vocal cords under direct vision,  positive ETCO2 and breath sounds checked- equal and bilateral Secured at: 21 cm Tube secured with: Tape Dental Injury: Teeth and Oropharynx as per pre-operative assessment

## 2011-12-08 NOTE — H&P (Signed)
PREOPERATIVE H&P  Chief Complaint: Neck pain  HPI: April Berg is a 66 y.o. female who presents with neck pain x 2 years  Past Medical History  Diagnosis Date  . Complication of anesthesia 90's    block for elbow surgery and pt had a seizure... surgery since with no problem   . Angina hx of viral pericarditis  with normal cath 2009  . Hypothyroidism   . GERD (gastroesophageal reflux disease)   . Headache     migraines  . Arthritis     osteoarthritis   Past Surgical History  Procedure Date  . Cardiac catheterization     2009  . Eye surgery     cat ext ou    glaucoma laser surg.   . Tonsillectomy   . Joint replacement     2012  right thumb  . Abdominal hysterectomy    History   Social History  . Marital Status: Married    Spouse Name: N/A    Number of Children: N/A  . Years of Education: N/A   Social History Main Topics  . Smoking status: None  . Smokeless tobacco: None  . Alcohol Use: 1.8 oz/week    3 Glasses of wine per week  . Drug Use:   . Sexually Active: Yes   Other Topics Concern  . None   Social History Narrative  . None   History reviewed. No pertinent family history. Allergies  Allergen Reactions  . Adhesive (Tape) Other (See Comments)    burns  . Aspirin Other (See Comments)    Chest pain  . Moxifloxacin Hives  . Neomycin Other (See Comments)    Eye irritation  . Nsaids Other (See Comments)    Chest pain  . Sulfonamide Derivatives Hives   Prior to Admission medications   Medication Sig Start Date End Date Taking? Authorizing Provider  Calcium Carbonate (CALCIUM 600 PO) Take 1,200 mg by mouth daily.   Yes Historical Provider, MD  cyclobenzaprine (FLEXERIL) 10 MG tablet Take 10 mg by mouth 2 (two) times daily as needed. For muscle spasms   Yes Historical Provider, MD  dexlansoprazole (DEXILANT) 60 MG capsule Take 60 mg by mouth daily.   Yes Historical Provider, MD  DULoxetine (CYMBALTA) 30 MG capsule Take 30 mg by mouth daily.   Yes  Historical Provider, MD  HYDROcodone-acetaminophen (NORCO) 5-325 MG per tablet Take 1 tablet by mouth every 6 (six) hours as needed. For pain   Yes Historical Provider, MD  hydrOXYzine (ATARAX/VISTARIL) 25 MG tablet Take 25 mg by mouth 3 (three) times daily as needed. For itching   Yes Historical Provider, MD  levothyroxine (SYNTHROID, LEVOTHROID) 125 MCG tablet Take 125 mcg by mouth daily.   Yes Historical Provider, MD  Multiple Vitamins-Minerals (MULTIVITAMINS THER. W/MINERALS) TABS Take 1 tablet by mouth daily.   Yes Historical Provider, MD  topiramate (TOPAMAX) 25 MG tablet Take 75 mg by mouth daily.   Yes Historical Provider, MD  Travoprost, BAK Free, (TRAVATAN) 0.004 % SOLN ophthalmic solution Place 1 drop into both eyes at bedtime.   Yes Historical Provider, MD  VITAMIN D, CHOLECALCIFEROL, PO Take 2,000 Units by mouth daily.   Yes Historical Provider, MD  Vitamin D, Ergocalciferol, (DRISDOL) 50000 UNITS CAPS Take 50,000 Units by mouth every 7 (seven) days.   Yes Historical Provider, MD  zolmitriptan (ZOMIG) 5 MG tablet Take 5 mg by mouth as needed. For migraines   Yes Historical Provider, MD     All other systems  have been reviewed and were otherwise negative with the exception of those mentioned in the HPI and as above.  Physical Exam: Filed Vitals:   12/08/11 1220  BP: 153/85  Pulse: 87  Temp: 97.7 F (36.5 C)  Resp: 18    General: Alert, no acute distress Cardiovascular: No pedal edema Respiratory: No cyanosis, no use of accessory musculature GI: No organomegaly, abdomen is soft and non-tender Skin: No lesions in the area of chief complaint Neurologic: Sensation intact distally Psychiatric: Patient is competent for consent with normal mood and affect Lymphatic: No axillary or cervical lymphadenopathy  MUSCULOSKELETAL: + TTP posterior cervical spine  Assessment/Plan: Facet pain C3-C6 Plan for Procedure(s): ANTERIOR CERVICAL DECOMPRESSION/DISCECTOMY FUSION  C3-C6   Emilee Hero, MD 12/08/2011 3:00 PM

## 2011-12-09 ENCOUNTER — Encounter (HOSPITAL_COMMUNITY): Payer: Self-pay | Admitting: Orthopedic Surgery

## 2011-12-09 MED ORDER — OXYCODONE-ACETAMINOPHEN 5-325 MG PO TABS: 1.0000 | ORAL_TABLET | ORAL | Status: AC | PRN

## 2011-12-09 MED ORDER — DIAZEPAM 5 MG PO TABS: 5.0000 mg | ORAL_TABLET | Freq: Four times a day (QID) | ORAL | Status: AC | PRN

## 2011-12-09 MED ORDER — PROMETHAZINE HCL 12.5 MG PO TABS: 12.5000 mg | ORAL_TABLET | Freq: Four times a day (QID) | ORAL | Status: AC | PRN

## 2011-12-09 MED ORDER — PROMETHAZINE HCL 25 MG RE SUPP: 25.0000 mg | RECTAL | Status: AC | PRN

## 2011-12-09 NOTE — Progress Notes (Signed)
Pt POD #1 after C3-C6 acdf, + N/V overnight  AVSS Aspen collar appropriately applied NVI  POD #1 after C3-6 acdf with n/v last evening  - d/c home today if vomittig and nausea resolves - philly collar to bedside - f/u my office in 2 weeks

## 2011-12-09 NOTE — Progress Notes (Signed)
Utilization review completed. Claretta Kendra, RN, BSN. 12/09/11  

## 2011-12-09 NOTE — Op Note (Signed)
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  POSTOPERATIVE DIAGNOSIS:  Right-sided facet arthritis, C3-4, C4-5, and C5-6.  PROCEDURES: 1. Anterior cervical decompression and fusion, C3-4, C4-5, and C5-6. 2. Placement of anterior instrumentation, C3 to C6. 3. Insertion of interbody device x3 (Titan interbody cage, 6 mm in     height, lordotic, small). 4. Use of local autograft. 5. Use of morselized allograft (DBX mix). 6. Intraoperative use of fluoroscopy.  SURGEON:  Estill Bamberg, MD  ASSISTANT:  Janace Litten, OPA  ANESTHESIA:  General endotracheal anesthesia.  COMPLICATIONS:  None .  DISPOSITION:  Stable.  ESTIMATED BLOOD LOSS:  Minimal.  INDICATIONS FOR PROCEDURE:  Briefly, April Berg is an extremely pleasant 66 year old female who initially presented to my office on December 31, 2010 with severe and debilitating pain in her neck.  The patient did subsequently go forward with extensive forms of conservative care. Ultimately, the patient did have facet injections involving the C3-4, C4- 5, and C5-6 facets on the right side.  These were done by Dr. Modesto Charon.  The patient is very clear in stating that these injections did provide relief for approximately 2-3 months.  Her pain did recur and she did go onto have medial branch block for pain relief was only very short lasting.  I did review an MRI, which was notable for degenerative disk disease, mainly involving the facets on the right side at the levels mentioned above.  Given the patient's failure of multiple forms of conservative care, she did wish to go forward with a  three-level anterior cervical decompression and fusion.  The patient fully understood the risks and limitations of the procedure as outlined in my preoperative note.  OPERATIVE DETAILS:  On December 08, 2011, the patient was brought to surgery and general endotracheal anesthesia was administered.  The patient was placed supine on a hospital bed.  The neck was placed in a gentle degree of extension.  The shoulders were taped to the inferior aspect of the bed.  SCDs were placed and antibiotics were given.  Time- out procedure was performed.  I then brought in lateral fluoroscopy, and I did Maxwell Lemen out the approximate level of the C4-5 interspace.  The neck was then prepped and draped in usual sterile fashion.  I then made a transverse incision from approximately the midline to the medial border of the sternocleidomastoid muscle.  The platysma was sharply incised. The plane between the sternocleidomastoid muscle laterally and the strap muscles medially was readily identified and explored.  The anterior cervical spine was readily noted.  The prevertebral fascia was identified and bluntly swept away.  I then placed a spinal needle into the C4-5 interspace and a lateral fluoroscopic view did confirm this to be the appropriate level.  I then subperiosteally exposed the vertebral bodies of  C3, C4, C5, and C6 out to the uncovertebral joints bilaterally.  I then first focused my attention over the C5-6 interspace.  The osteophytes were identified anteriorly and were removed using a rongeur and placed on the back table.  I then used a 15-blade knife to perform the diskectomy.  A thorough and complete diskectomy was performed to the posterior longitudinal ligament.  I then gently prepared the endplates using a bur in addition to a series of curettes and rasps.  I then went forward placing a series of trials.  Of note, prior to placing the trials, I did place Caspar pins into the C5 and C6 vertebral  bodies and distraction was applied.  I then went forward placing trials, and I did feel that a lordotic 6-mm implant would be the most appropriate fit.  The implant was then filled with autograft in addition to allograft in the form of the DBX mix and was tamped into position in the usual fashion.  Of note, the patient's vertebral bodies were noted to be rather small and I did elect to go forward with a small implant given the small size of her vertebral bodies.  The location of the implant was checked on both AP and lateral fluoroscopy.  I then turned my attention towards the C4-5 interspace.  I again placed Caspar pins and distraction was applied and diskectomy was performed in the manner described previously.  I again prepared the endplates and packed the intervertebral graft with both allograft and autograft and the same size graft was tamped into position in the manner described previously. The same was done at the C3-4 level.  Of note, I was very happy with the final press fit of each of the interbody grafts and I was very happy with the final appearance on both AP and lateral fluoroscopy.  I then chose an appropriately sized Synthes Vectra plate.  The plate was placed over the anterior cervical spine.  Of note, a 14-mm self-drilling, self- tapping screws were placed into the C3 and C6 vertebral bodies.  I did elect to use 12-mm screws in the C4 and C5 vertebral bodies, given the rather low AP diameter of the vertebral bodies.  I did, however, note excellent purchase of the screws.  At this point, again, AP and lateral fluoroscopy was obtained and I was extremely pleased with the final appearance of the radiographs.  The wound was then copiously irrigated. The platysma and deep subcutaneous layer was closed using 2-0 Vicryl and the skin was closed using 4-0 Monocryl.  Benzoin and Steri-Strips were applied, followed by a sterile dressing.  The patient was then awoken from general  endotracheal anesthesia and transferred to recovery in stable condition.  All instrument counts were correct at the termination of the procedure.  Of note, Janace Litten, was my assistant throughout the procedure and aided in essential retraction and suctioning required throughout the surgery.     Estill Bamberg, MD     MD/MEDQ  D:  12/08/2011  T:  12/09/2011  Job:  [id number]  cc:   Lupita Raider, M.D.

## 2011-12-09 NOTE — Progress Notes (Signed)
Pt had two episodes of vomiting. Pt also c/o nausea unrelieved by Vistaril. MD on call notified, new orders received. Will continue to monitor and provide support.

## 2011-12-10 ENCOUNTER — Encounter (HOSPITAL_COMMUNITY): Payer: Self-pay | Admitting: *Deleted

## 2011-12-10 MED ORDER — SUMATRIPTAN SUCCINATE 100 MG PO TABS
100.0000 mg | ORAL_TABLET | Freq: Every day | ORAL | Status: DC | PRN
  Filled 2011-12-10: qty 1

## 2011-12-10 NOTE — Progress Notes (Signed)
Pt POD #2 after C3-C6 acdf, + N/V yesterday improved  AVSS  Aspen collar appropriately applied  NVI   POD #2 after C3-6 acdf with n/v last evening  - d/c home today if vomittig and nausea continues to resolve - philly collar to bedside  - f/u my office in 2 weeks

## 2011-12-10 NOTE — Progress Notes (Signed)
Patient discharge with discharge instruction given.  Patient and family verbalised understanding.  No s/s of distress upon discharge.     Sian

## 2011-12-16 NOTE — Discharge Summary (Signed)
April Berg, April Berg               ACCOUNT NO.:  [id number]  MEDICAL RECORD NO.:  [id number]  LOCATION:  3031                         FACILITY:  [facility]  PHYSICIAN:  Estill Bamberg, MD      DATE OF BIRTH:  30-Nov-1945  DATE OF ADMISSION:  12/08/2011 DATE OF DISCHARGE:  12/10/2011                              DISCHARGE SUMMARY   PREOPERATIVE DIAGNOSIS:  Facet arthritis involving the cervical spine.  DISCHARGE DIAGNOSIS:  Facet arthritis involving the cervical spine.  ADMISSION HISTORY:  Briefly, [name] is an extremely pleasant 66 year old female who presents to me with severe debilitating pain in her neck. The patient was previously seen by her pain specialist Dr. Modesto Charon.  Dr. Modesto Charon did go forward with facet injections involving the C3-4, C4-5 and C5-6 facet joints on the patient's cervical spine.  The patient did get profound improvement from the injections.  The patient was not interested in a radiofrequency ablation procedure and did follow up with me for discussion regarding operative intervention.  We did go forward conservative care, but the patient did ultimately decide to go forward with a three-level anterior cervical decompression fusion involving the C3-4, C4-5 and C5-6 levels.  The patient was therefore admitted on December 08, 2011, for the procedure noted above.  HOSPITAL COURSE:  On December 08, 2011, the patient was brought to the surgery and underwent a three-level ACDF as noted above.  The patient tolerated the procedure well and was transferred to recovery in stable condition.  The patient was evaluated by me on the morning of postoperative day.  The patient had rather significant nausea and vomiting which did complicate her postoperative stay.  Of note, the patient did report that she does get nauseous after most surgeries and she do clearly get nauseous after her ACDF as well.  The patient was observed over the course of postoperative day #1 and was then  again evaluated on postoperative day #2.  On the morning of postop day #2, the patient was still noted to have some nausea but did appear that her nausea was controlled and by the evening on postop day #2, her nausea was adequately controlled to the extent where she was able to be safely discharged home.  The patient was placed in Aspen cervical collar postoperatively, and her Aspen collar was maintained throughout her hospital stay.  Of note, an order was made for the patient to receive a Philadelphia collar to be used for showering.  I did also discuss with our floor nurse getting her Philadelphia collar but this was never given.  DISCHARGE INSTRUCTIONS:  The patient will take Percocet for pain and Valium for spasms.  She will wear her Aspen cervical collar at all times.  She was unfortunately not given a Philadelphia collar to be used for showering. So I personally obtain a Philadelphia collar from the hospital and get this to her over the course of the next week.  She will avoid lifting over 10 pounds and she will follow back up in my office in approximately 2 weeks after her procedure.     Estill Bamberg, MD     MD/MEDQ  D:  12/15/2011  T:  12/16/2011  Job:  [id number]

## 2012-05-02 DIAGNOSIS — J309 Allergic rhinitis, unspecified: Secondary | ICD-10-CM | POA: Insufficient documentation

## 2012-05-02 DIAGNOSIS — J329 Chronic sinusitis, unspecified: Secondary | ICD-10-CM | POA: Insufficient documentation

## 2012-06-15 ENCOUNTER — Emergency Department (HOSPITAL_COMMUNITY)
Admission: EM | Admit: 2012-06-15 | Discharge: 2012-06-15 | Disposition: A | Discharge status: Home / Self Care | Payer: BC Managed Care – PPO | Attending: Emergency Medicine | Admitting: Emergency Medicine

## 2012-06-15 ENCOUNTER — Emergency Department (HOSPITAL_COMMUNITY): Payer: BC Managed Care – PPO

## 2012-06-15 ENCOUNTER — Encounter (HOSPITAL_COMMUNITY): Payer: Self-pay | Admitting: *Deleted

## 2012-06-15 DIAGNOSIS — K219 Gastro-esophageal reflux disease without esophagitis: Secondary | ICD-10-CM | POA: Insufficient documentation

## 2012-06-15 DIAGNOSIS — R51 Headache: Secondary | ICD-10-CM

## 2012-06-15 DIAGNOSIS — Z8739 Personal history of other diseases of the musculoskeletal system and connective tissue: Secondary | ICD-10-CM | POA: Insufficient documentation

## 2012-06-15 DIAGNOSIS — Z79899 Other long term (current) drug therapy: Secondary | ICD-10-CM | POA: Insufficient documentation

## 2012-06-15 DIAGNOSIS — E039 Hypothyroidism, unspecified: Secondary | ICD-10-CM | POA: Insufficient documentation

## 2012-06-15 DIAGNOSIS — M542 Cervicalgia: Secondary | ICD-10-CM | POA: Insufficient documentation

## 2012-06-15 HISTORY — DX: Migraine, unspecified, not intractable, without status migrainosus: G43.909

## 2012-06-15 MED ORDER — SODIUM CHLORIDE 0.9 % IV BOLUS (SEPSIS)
500.0000 mL | Freq: Once | INTRAVENOUS | Status: AC
  Administered 2012-06-15: 500 mL via INTRAVENOUS

## 2012-06-15 MED ORDER — DROPERIDOL 2.5 MG/ML IJ SOLN
1.2500 mg | Freq: Once | INTRAMUSCULAR | Status: AC
  Administered 2012-06-15: 1.25 mg via INTRAVENOUS
  Filled 2012-06-15: qty 0.5

## 2012-06-15 MED ORDER — LORAZEPAM 2 MG/ML IJ SOLN
1.0000 mg | Freq: Once | INTRAMUSCULAR | Status: AC
  Administered 2012-06-15: 1 mg via INTRAVENOUS
  Filled 2012-06-15: qty 1

## 2012-06-15 MED ORDER — DIPHENHYDRAMINE HCL 50 MG/ML IJ SOLN
25.0000 mg | Freq: Once | INTRAMUSCULAR | Status: AC
  Administered 2012-06-15: 25 mg via INTRAVENOUS
  Filled 2012-06-15: qty 1

## 2012-06-15 MED ORDER — PROMETHAZINE HCL 25 MG/ML IJ SOLN
25.0000 mg | Freq: Once | INTRAMUSCULAR | Status: AC
  Administered 2012-06-15: 25 mg via INTRAVENOUS
  Filled 2012-06-15: qty 1

## 2012-06-15 NOTE — ED Notes (Signed)
Pt was on topamax for migraines.  Had anterior cervical fusion 1/13 and since then has had progressive neck pain.  Awoke 1 am with headache.  Pain is unbearable with emesis.

## 2012-06-15 NOTE — ED Provider Notes (Signed)
I assumed care this patient from Dr. Rubin Payor at 7 AM. Gradual onset typical migraine headache with history of cervical fusion 6 months ago. Awaiting pain control and CT of the cervical spine.  Reassess patient. Improvement after her second round of medications. She confirms current headache is gradual in onset and similar to previous migraines. She stable for outpatient followup.  BP 153/92  Pulse 93  Resp 15  SpO2 100%   Glynn Octave, MD 06/15/12 1027

## 2012-06-15 NOTE — ED Notes (Signed)
66/F brought to ER for c/o severe neck pain and severe headache x 1 week per pt. Pt worsened last night. Pt also c/o vomiting since 0445 this morning. Pt took 1 Percocet tablet at 2245 last night and 1 Percocet tablet at 0100 this morning for neck and  Head pain per pt.

## 2012-06-15 NOTE — ED Notes (Signed)
Patient stated that her headache is better at a 6/10. Will continue to monitor.

## 2012-06-15 NOTE — ED Provider Notes (Signed)
History     CSN: [id number]  Arrival date & time 06/15/12  0604   First MD Initiated Contact with Patient 06/15/12 0631      Chief Complaint  Patient presents with  . Migraine    (Consider location/radiation/quality/duration/timing/severity/associated sxs/prior treatment) Patient is a 66 y.o. female presenting with migraine. The history is provided by the patient.  Migraine This is a recurrent problem. Associated symptoms include headaches. Pertinent negatives include no chest pain, no abdominal pain and no shortness of breath.   patient presents with a migraine that began last night. She's a history of migraines, but has been doing better with her headaches she had a cervical fusion in January. She done well with neck pain for a few months, but over the last for one she's began to have pain again. She's been seeing Dr. Yevette Edwards who did the initial surgery. She has had throbbing typical headache. She has photophobia and nausea. No localizing numbness or weakness. No abdominal pain. No chest pain. No fevers.  Past Medical History  Diagnosis Date  . Complication of anesthesia 90's    block for elbow surgery and pt had a seizure... surgery since with no problem   . Angina hx of viral pericarditis  with normal cath 2009  . Hypothyroidism   . GERD (gastroesophageal reflux disease)   . Headache     migraines  . Arthritis     osteoarthritis  . Migraines     Past Surgical History  Procedure Date  . Cardiac catheterization     2009  . Eye surgery     cat ext ou    glaucoma laser surg.   . Tonsillectomy   . Joint replacement     2012  right thumb  . Abdominal hysterectomy   . Anterior cervical decomp/discectomy fusion 12/08/2011    Procedure: ANTERIOR CERVICAL DECOMPRESSION/DISCECTOMY FUSION 3 LEVELS;  Surgeon: Emilee Hero, MD;  Location: [facility] OR;  Service: Orthopedics;  Laterality: N/A;  C 3-6 ACDF    No family history on file.  History  Substance Use Topics  .  Smoking status: Never Smoker   . Smokeless tobacco: Never Used  . Alcohol Use: 1.8 oz/week    3 Glasses of wine per week    OB History    Grav Para Term Preterm Abortions TAB SAB Ect Mult Living                  Review of Systems  Constitutional: Negative for activity change and appetite change.  HENT: Negative for neck stiffness.   Eyes: Positive for photophobia. Negative for pain.  Respiratory: Negative for chest tightness and shortness of breath.   Cardiovascular: Negative for chest pain and leg swelling.  Gastrointestinal: Positive for nausea. Negative for vomiting, abdominal pain and diarrhea.  Genitourinary: Negative for flank pain.  Musculoskeletal: Negative for back pain.  Skin: Negative for rash.  Neurological: Positive for headaches. Negative for weakness and numbness.  Psychiatric/Behavioral: Negative for behavioral problems.    Allergies  Adhesive; Aspirin; Moxifloxacin; Neomycin; Nsaids; and Sulfonamide derivatives  Home Medications   Current Outpatient Rx  Name Route Sig Dispense Refill  . CALCIUM 600 PO Oral Take 1,200 mg by mouth daily.    . DEXLANSOPRAZOLE 60 MG PO CPDR Oral Take 60 mg by mouth daily.    . DULOXETINE HCL 30 MG PO CPEP Oral Take 30 mg by mouth daily.    Marland Kitchen HYDROXYZINE HCL 25 MG PO TABS Oral Take 25 mg by mouth  3 (three) times daily as needed. For itching    . LEVOTHYROXINE SODIUM 125 MCG PO TABS Oral Take 125 mcg by mouth daily.    Carma Leaven M PLUS PO TABS Oral Take 1 tablet by mouth daily.    . TOPIRAMATE 25 MG PO TABS Oral Take 75 mg by mouth daily.    . TRAVOPROST (BAK FREE) 0.004 % OP SOLN Both Eyes Place 1 drop into both eyes at bedtime.    Marland Kitchen VITAMIN D (CHOLECALCIFEROL) PO Oral Take 2,000 Units by mouth daily.    Marland Kitchen VITAMIN D (ERGOCALCIFEROL) 50000 UNITS PO CAPS Oral Take 50,000 Units by mouth every 7 (seven) days.    Marland Kitchen ZOLMITRIPTAN 5 MG PO TABS Oral Take 5 mg by mouth as needed. For migraines      BP 184/107  Pulse 107  Resp 22  SpO2  100%  Physical Exam  Nursing note and vitals reviewed. Constitutional: She is oriented to person, place, and time. She appears well-developed and well-nourished.  HENT:  Head: Normocephalic and atraumatic.  Eyes: EOM are normal. Pupils are equal, round, and reactive to light.  Neck: Normal range of motion. Neck supple.  Cardiovascular: Normal rate, regular rhythm and normal heart sounds.   No murmur heard. Pulmonary/Chest: Effort normal and breath sounds normal. No respiratory distress. She has no wheezes. She has no rales.  Abdominal: Soft. Bowel sounds are normal. She exhibits no distension. There is no tenderness. There is no rebound and no guarding.  Musculoskeletal: Normal range of motion.       Mild tenderness over right neck posteriorly.  Neurological: She is alert and oriented to person, place, and time. No cranial nerve deficit.  Skin: Skin is warm and dry.  Psychiatric: She has a normal mood and affect. Her speech is normal.    ED Course  Procedures (including critical care time)  Labs Reviewed - No data to display No results found.   No diagnosis found.    MDM  Patient with headache. History of migraines that have been well controlled recently. She's had increasing neck pain since a previous neck surgery. There is talk of failure of fusion versus adjacent disease.   CERVICAL spine CT has been done. She's not sleeping after treatment for her migraine.       Juliet Rude. Rubin Payor, MD 06/15/12 [phone]

## 2012-06-15 NOTE — ED Notes (Signed)
Report given to Jenny, receiving RN. 

## 2012-07-10 DIAGNOSIS — R2689 Other abnormalities of gait and mobility: Secondary | ICD-10-CM | POA: Insufficient documentation

## 2012-07-10 DIAGNOSIS — H8109 Meniere's disease, unspecified ear: Secondary | ICD-10-CM | POA: Insufficient documentation

## 2012-07-10 DIAGNOSIS — H9319 Tinnitus, unspecified ear: Secondary | ICD-10-CM | POA: Insufficient documentation

## 2012-07-10 DIAGNOSIS — H9071 Mixed conductive and sensorineural hearing loss, unilateral, right ear, with unrestricted hearing on the contralateral side: Secondary | ICD-10-CM | POA: Insufficient documentation

## 2012-08-14 ENCOUNTER — Other Ambulatory Visit: Payer: Self-pay | Admitting: Internal Medicine

## 2012-08-14 DIAGNOSIS — Z1231 Encounter for screening mammogram for malignant neoplasm of breast: Secondary | ICD-10-CM

## 2012-09-25 ENCOUNTER — Ambulatory Visit
Admission: RE | Admit: 2012-09-25 | Discharge: 2012-09-25 | Disposition: A | Discharge status: Home / Self Care | Payer: BC Managed Care – PPO | Source: Ambulatory Visit | Attending: Internal Medicine | Admitting: Internal Medicine

## 2012-09-25 DIAGNOSIS — Z1231 Encounter for screening mammogram for malignant neoplasm of breast: Secondary | ICD-10-CM

## 2012-10-03 DIAGNOSIS — M5412 Radiculopathy, cervical region: Secondary | ICD-10-CM | POA: Insufficient documentation

## 2012-10-17 ENCOUNTER — Emergency Department (HOSPITAL_COMMUNITY)
Admission: EM | Admit: 2012-10-17 | Discharge: 2012-10-17 | Disposition: A | Discharge status: Home / Self Care | Payer: BC Managed Care – PPO | Attending: Emergency Medicine | Admitting: Emergency Medicine

## 2012-10-17 ENCOUNTER — Encounter (HOSPITAL_COMMUNITY): Payer: Self-pay | Admitting: *Deleted

## 2012-10-17 DIAGNOSIS — E785 Hyperlipidemia, unspecified: Secondary | ICD-10-CM | POA: Insufficient documentation

## 2012-10-17 DIAGNOSIS — M4800 Spinal stenosis, site unspecified: Secondary | ICD-10-CM | POA: Insufficient documentation

## 2012-10-17 DIAGNOSIS — M545 Low back pain, unspecified: Secondary | ICD-10-CM | POA: Insufficient documentation

## 2012-10-17 DIAGNOSIS — E039 Hypothyroidism, unspecified: Secondary | ICD-10-CM | POA: Insufficient documentation

## 2012-10-17 DIAGNOSIS — Z79899 Other long term (current) drug therapy: Secondary | ICD-10-CM | POA: Insufficient documentation

## 2012-10-17 DIAGNOSIS — Z8679 Personal history of other diseases of the circulatory system: Secondary | ICD-10-CM | POA: Insufficient documentation

## 2012-10-17 DIAGNOSIS — G43909 Migraine, unspecified, not intractable, without status migrainosus: Secondary | ICD-10-CM | POA: Insufficient documentation

## 2012-10-17 DIAGNOSIS — M549 Dorsalgia, unspecified: Secondary | ICD-10-CM

## 2012-10-17 DIAGNOSIS — K219 Gastro-esophageal reflux disease without esophagitis: Secondary | ICD-10-CM | POA: Insufficient documentation

## 2012-10-17 DIAGNOSIS — M1990 Unspecified osteoarthritis, unspecified site: Secondary | ICD-10-CM | POA: Insufficient documentation

## 2012-10-17 HISTORY — DX: Spinal stenosis, site unspecified: M48.00

## 2012-10-17 MED ORDER — HYDROMORPHONE HCL 4 MG PO TABS: ORAL_TABLET | ORAL | Status: DC

## 2012-10-17 MED ORDER — HYDROMORPHONE HCL PF 2 MG/ML IJ SOLN
2.0000 mg | Freq: Once | INTRAMUSCULAR | Status: AC
  Administered 2012-10-17: 2 mg via INTRAVENOUS
  Filled 2012-10-17: qty 1

## 2012-10-17 MED ORDER — DIPHENHYDRAMINE HCL 50 MG/ML IJ SOLN
12.5000 mg | Freq: Once | INTRAMUSCULAR | Status: AC
  Administered 2012-10-17: 12.5 mg via INTRAVENOUS
  Filled 2012-10-17: qty 1

## 2012-10-17 MED ORDER — SUMATRIPTAN SUCCINATE 6 MG/0.5ML ~~LOC~~ SOLN: 6.0000 mg | Freq: Once | SUBCUTANEOUS | Status: DC

## 2012-10-17 MED ORDER — LIDOCAINE 5 % EX PTCH: 1.0000 | MEDICATED_PATCH | CUTANEOUS | Status: DC

## 2012-10-17 MED ORDER — METOCLOPRAMIDE HCL 5 MG/ML IJ SOLN: 10.0000 mg | Freq: Once | INTRAMUSCULAR | Status: DC

## 2012-10-17 MED ORDER — ONDANSETRON HCL 4 MG PO TABS: 4.0000 mg | ORAL_TABLET | Freq: Four times a day (QID) | ORAL | Status: DC

## 2012-10-17 MED ORDER — ONDANSETRON 4 MG PO TBDP
4.0000 mg | ORAL_TABLET | Freq: Once | ORAL | Status: AC
  Administered 2012-10-17: 4 mg via ORAL
  Filled 2012-10-17: qty 1

## 2012-10-17 MED ORDER — HYDROMORPHONE HCL PF 1 MG/ML IJ SOLN
1.0000 mg | Freq: Once | INTRAMUSCULAR | Status: AC
  Administered 2012-10-17: 1 mg via INTRAMUSCULAR
  Filled 2012-10-17: qty 1

## 2012-10-17 MED ORDER — METOCLOPRAMIDE HCL 5 MG/ML IJ SOLN
10.0000 mg | Freq: Once | INTRAMUSCULAR | Status: AC
  Administered 2012-10-17: 10 mg via INTRAVENOUS
  Filled 2012-10-17: qty 2

## 2012-10-17 NOTE — ED Provider Notes (Addendum)
History     CSN: [id number]  Arrival date & time 10/17/12  4098   First MD Initiated Contact with Patient 10/17/12 0401      Chief Complaint  Patient presents with  . Back Pain    (Consider location/radiation/quality/duration/timing/severity/associated sxs/prior treatment) Patient is a 66 y.o. female presenting with back pain. The history is provided by the patient and the spouse.  Back Pain  Pertinent negatives include no fever, no headaches, no dysuria and no weakness.   66 year old, female, with a history of a herniated disc, presents to emergency department complaining of severe back pain.  That radiates down both of her legs to the level of her ankles.  She denies trauma, nausea, vomiting, fevers, chills, urinary tract symptoms, or bowel or bladder incontinence.  She states that this past weekend.  She had been at a park with her grandson and she was playing with him as if she were 66 years old, herself.  Since then, she has had severe back pain, which has limited her ability to bend over or to sleep.  Past Medical History  Diagnosis Date  . Complication of anesthesia 90's    block for elbow surgery and pt had a seizure... surgery since with no problem   . Angina hx of viral pericarditis  with normal cath 2009  . Hypothyroidism   . GERD (gastroesophageal reflux disease)   . Headache     migraines  . Arthritis     osteoarthritis  . Migraines   . Spinal stenosis     Past Surgical History  Procedure Date  . Cardiac catheterization     2009  . Eye surgery     cat ext ou    glaucoma laser surg.   . Tonsillectomy   . Joint replacement     2012  right thumb  . Abdominal hysterectomy   . Anterior cervical decomp/discectomy fusion 12/08/2011    Procedure: ANTERIOR CERVICAL DECOMPRESSION/DISCECTOMY FUSION 3 LEVELS;  Surgeon: Emilee Hero, MD;  Location: [facility] OR;  Service: Orthopedics;  Laterality: N/A;  C 3-6 ACDF    History reviewed. No pertinent family  history.  History  Substance Use Topics  . Smoking status: Never Smoker   . Smokeless tobacco: Never Used  . Alcohol Use: 1.8 oz/week    3 Glasses of wine per week    OB History    Grav Para Term Preterm Abortions TAB SAB Ect Mult Living                  Review of Systems  Constitutional: Negative for fever.  HENT: Positive for neck pain.        Chronic neck pain, which has subsided  Gastrointestinal: Negative for nausea and vomiting.  Genitourinary: Negative for dysuria.  Musculoskeletal: Positive for back pain.  Neurological: Negative for weakness and headaches.  All other systems reviewed and are negative.    Allergies  Adhesive; Aspirin; Moxifloxacin; Neomycin; Nsaids; and Sulfonamide derivatives  Home Medications   Current Outpatient Rx  Name  Route  Sig  Dispense  Refill  . AZELASTINE HCL 137 MCG/SPRAY NA SOLN   Nasal   Place 1 spray into the nose 1 day or 1 dose. Use in each nostril as directed         . CALCIUM 600 PO   Oral   Take 1,200 mg by mouth daily.         . DEXLANSOPRAZOLE 60 MG PO CPDR   Oral   Take  60 mg by mouth daily.         Marland Kitchen DIAZEPAM 5 MG PO TABS   Oral   Take 5 mg by mouth every 6 (six) hours as needed.         . DULOXETINE HCL 30 MG PO CPEP   Oral   Take 30 mg by mouth daily.         Marland Kitchen LEVOTHYROXINE SODIUM 125 MCG PO TABS   Oral   Take 112 mcg by mouth daily.          Carma Leaven M PLUS PO TABS   Oral   Take 1 tablet by mouth daily.         . OXYCODONE-ACETAMINOPHEN 10-325 MG PO TABS   Oral   Take 1 tablet by mouth every 4 (four) hours as needed.         . SUMATRIPTAN SUCCINATE 100 MG PO TABS   Oral   Take 100 mg by mouth every 2 (two) hours as needed.         . TOPIRAMATE 25 MG PO CPSP   Oral   Take 75 mg by mouth 1 day or 1 dose.         . TRAVOPROST (BAK FREE) 0.004 % OP SOLN   Both Eyes   Place 1 drop into both eyes at bedtime.         Marland Kitchen VITAMIN D (CHOLECALCIFEROL) PO   Oral   Take 2,000  Units by mouth daily.         Marland Kitchen VITAMIN D (ERGOCALCIFEROL) 50000 UNITS PO CAPS   Oral   Take 50,000 Units by mouth every 7 (seven) days.         Marland Kitchen HYDROXYZINE HCL 25 MG PO TABS   Oral   Take 25 mg by mouth 3 (three) times daily as needed. For itching         . ZOLMITRIPTAN 5 MG PO TABS   Oral   Take 5 mg by mouth as needed. For migraines           BP 152/63  Temp 98.4 F (36.9 C) (Oral)  Resp 18  SpO2 98%  Physical Exam  Nursing note and vitals reviewed. Constitutional: She is oriented to person, place, and time. She appears well-developed and well-nourished.  HENT:  Head: Normocephalic and atraumatic.  Eyes: Conjunctivae normal are normal.  Neck: Normal range of motion.  Pulmonary/Chest: Effort normal.  Abdominal: She exhibits no distension.  Musculoskeletal: Normal range of motion. She exhibits no edema and no tenderness.       Lower thoracic and lumbar tenderness  Neurological: She is alert and oriented to person, place, and time.       Straight leg raise is negative bilaterally Normal.  Strength in both legs  Skin: Skin is warm and dry.  Psychiatric: She has a normal mood and affect. Thought content normal.    ED Course  Procedures (including critical care time) back pain after vigorous activity.  2 days ago.  No neurological deficits.  No red flags to suggest malignancy or systemic illness.  We'll give Dilaudid.  For pain  Labs Reviewed - No data to display No results found.   No diagnosis found.  5:23 AM Pain had decreased but is now increasing again.  6:13 AM Pain resolved but pt is drowsy and desats when she sleeps.    6:33 AM Still sleeping and desats when not on oxygen.    MDM  Back  pain, with no neurological deficits or signs of systemic illness        Cheri Guppy, MD 10/17/12 1610  Cheri Guppy, MD 10/17/12 9604  Cheri Guppy, MD 10/17/12 5409  Cheri Guppy, MD 10/17/12 [phone]

## 2012-10-17 NOTE — ED Notes (Signed)
MD at bedside. 

## 2012-10-17 NOTE — ED Provider Notes (Signed)
12:25 PM Pt sleeping soundly.  1:35 PM Spoke with husband who states that she has not vomited recently. He states that narcotics often makes her have vomiting and sometimes trigger migraine.  Per husband, patient refused the sumatriptan 2/2 an elevated blood pressure.  Pt continues to sleep.    2:37 PM  Reevaluated, patient is able to stand and ambulates with minor assistance. She states that her back pain has largely resolved.  Additional recheck of her lumbar spine is without spasm or tenderness.  She has not vomited recently.  Discussed the situation with the patient and her husband and they agreed that she will rest and eat better at home. We will redose Reglan prior to discharge and then discharge home with Zofran and Lidoderm patch.  Dr Weldon Inches Rx for dilaudid as well.  Patient's blood pressure decreasing spontaneously as hoped.    1. Medications: Dilaudid per Dr Weldon Inches, Zofran, lidoderm patch 2. Treatment: Rest, drink plenty of fluids, use Zofran as needed for nausea and vomiting, use dilaudid as needed for back pain, use Lidoderm patch for neck pain/focal back pain 3. Follow Up: With primary care doctor and/or orthopedist as needed   Dierdre Forth, PA-C 10/17/12 1444

## 2012-10-17 NOTE — ED Notes (Signed)
DR Oletta Lamas AWARE OF PT VOMITING AND HEADACHE. HE IS PUTTING IN ORDERS FOR PT

## 2012-10-17 NOTE — ED Notes (Signed)
Pt c/o back pain x 1 week, worse tonight.  Pain spreads down legs bilaterally.

## 2012-10-17 NOTE — ED Notes (Signed)
Family at bedside. 

## 2012-10-17 NOTE — ED Notes (Signed)
Pa is at bedside discussing discharge plan. Pt is sleepy. Husband is going to be care taker. States he thinks she will do better and can rest better at home. Pt only complaint at this time is headache. States her back pain is relieved

## 2012-10-17 NOTE — ED Notes (Signed)
Pt. To the bathroom with the Stedi. Pt. Voided without difficulty

## 2012-10-17 NOTE — ED Notes (Signed)
PT BROUGHT TO CDU TO REST AND BE SURE HER BACK PAIN ISSUES WERE MANAGED. UPON ARRIVAL SHE COMPLAINS OF NAUSEA WITH EMESIS (LIQUID GREEN) AND A HEADACHE. PT HUSBAND STATES THAT SHE HAS BEEN HAVING A LOT OF MIGRAINE HEADACHES THE PAST WEEK DUE TO HER RECENT NECK SURGERY

## 2012-10-17 NOTE — ED Provider Notes (Addendum)
10:33 AM Pt is feeling improved, has slept well for a few hours.  Recheck of lumbar spine shows no spasms or areas of gross tenderness.  If pt is able to stand, ambulate some without much pain, will d/c home.    Gavin Pound. Kymberli Wiegand, MD 10/17/12 1034    11:54 AM Pt has been vomiting again.  Now with HA, has h/o migraines.  Does take imitrex tablets at home.  Will give SQ here as well as additional reglan for active N/V.    Gavin Pound. Brittanie Dosanjh, MD 10/17/12 1155

## 2012-10-17 NOTE — ED Provider Notes (Signed)
Seen by me, back pain improved.  Treated for migraine exacerbation, typical for her while here as well.  Rx written for by Dr. Weldon Inches originally.    April Berg. [place] Severs, MD 10/17/12 1536

## 2012-10-17 NOTE — ED Notes (Signed)
Pt reports taking percocet 10-325 with no relief.

## 2013-02-23 DIAGNOSIS — M5412 Radiculopathy, cervical region: Secondary | ICD-10-CM | POA: Diagnosis not present

## 2013-02-26 DIAGNOSIS — M2624 Reverse articulation: Secondary | ICD-10-CM | POA: Diagnosis not present

## 2013-02-26 DIAGNOSIS — H9319 Tinnitus, unspecified ear: Secondary | ICD-10-CM | POA: Diagnosis not present

## 2013-02-26 DIAGNOSIS — H9209 Otalgia, unspecified ear: Secondary | ICD-10-CM | POA: Diagnosis not present

## 2013-02-26 DIAGNOSIS — H908 Mixed conductive and sensorineural hearing loss, unspecified: Secondary | ICD-10-CM | POA: Diagnosis not present

## 2013-02-26 DIAGNOSIS — H8109 Meniere's disease, unspecified ear: Secondary | ICD-10-CM | POA: Diagnosis not present

## 2013-02-26 DIAGNOSIS — J309 Allergic rhinitis, unspecified: Secondary | ICD-10-CM | POA: Diagnosis not present

## 2013-03-27 DIAGNOSIS — E785 Hyperlipidemia, unspecified: Secondary | ICD-10-CM | POA: Diagnosis not present

## 2013-03-27 DIAGNOSIS — E039 Hypothyroidism, unspecified: Secondary | ICD-10-CM | POA: Diagnosis not present

## 2013-03-27 DIAGNOSIS — I10 Essential (primary) hypertension: Secondary | ICD-10-CM | POA: Diagnosis not present

## 2013-03-27 DIAGNOSIS — M899 Disorder of bone, unspecified: Secondary | ICD-10-CM | POA: Diagnosis not present

## 2013-03-27 DIAGNOSIS — M542 Cervicalgia: Secondary | ICD-10-CM | POA: Diagnosis not present

## 2013-03-29 DIAGNOSIS — N760 Acute vaginitis: Secondary | ICD-10-CM | POA: Diagnosis not present

## 2013-03-29 DIAGNOSIS — M47812 Spondylosis without myelopathy or radiculopathy, cervical region: Secondary | ICD-10-CM | POA: Diagnosis not present

## 2013-04-02 DIAGNOSIS — G43019 Migraine without aura, intractable, without status migrainosus: Secondary | ICD-10-CM | POA: Diagnosis not present

## 2013-04-02 DIAGNOSIS — G43809 Other migraine, not intractable, without status migrainosus: Secondary | ICD-10-CM | POA: Diagnosis not present

## 2013-04-04 DIAGNOSIS — E039 Hypothyroidism, unspecified: Secondary | ICD-10-CM | POA: Diagnosis not present

## 2013-04-04 DIAGNOSIS — E785 Hyperlipidemia, unspecified: Secondary | ICD-10-CM | POA: Diagnosis not present

## 2013-04-04 DIAGNOSIS — Z23 Encounter for immunization: Secondary | ICD-10-CM | POA: Diagnosis not present

## 2013-04-04 DIAGNOSIS — Z0000 Encounter for general adult medical examination without abnormal findings: Secondary | ICD-10-CM | POA: Diagnosis not present

## 2013-04-04 DIAGNOSIS — M899 Disorder of bone, unspecified: Secondary | ICD-10-CM | POA: Diagnosis not present

## 2013-04-04 DIAGNOSIS — I10 Essential (primary) hypertension: Secondary | ICD-10-CM | POA: Diagnosis not present

## 2013-04-04 DIAGNOSIS — Z1212 Encounter for screening for malignant neoplasm of rectum: Secondary | ICD-10-CM | POA: Diagnosis not present

## 2013-04-04 DIAGNOSIS — M4800 Spinal stenosis, site unspecified: Secondary | ICD-10-CM | POA: Diagnosis not present

## 2013-04-04 DIAGNOSIS — M26609 Unspecified temporomandibular joint disorder, unspecified side: Secondary | ICD-10-CM | POA: Diagnosis not present

## 2013-04-04 DIAGNOSIS — N952 Postmenopausal atrophic vaginitis: Secondary | ICD-10-CM | POA: Diagnosis not present

## 2013-04-04 DIAGNOSIS — M949 Disorder of cartilage, unspecified: Secondary | ICD-10-CM | POA: Diagnosis not present

## 2013-04-04 DIAGNOSIS — Z1331 Encounter for screening for depression: Secondary | ICD-10-CM | POA: Diagnosis not present

## 2013-04-05 DIAGNOSIS — M47812 Spondylosis without myelopathy or radiculopathy, cervical region: Secondary | ICD-10-CM | POA: Diagnosis not present

## 2013-04-17 DIAGNOSIS — M47812 Spondylosis without myelopathy or radiculopathy, cervical region: Secondary | ICD-10-CM | POA: Diagnosis not present

## 2013-04-18 DIAGNOSIS — R109 Unspecified abdominal pain: Secondary | ICD-10-CM | POA: Diagnosis not present

## 2013-04-18 DIAGNOSIS — N3941 Urge incontinence: Secondary | ICD-10-CM | POA: Diagnosis not present

## 2013-04-19 DIAGNOSIS — R109 Unspecified abdominal pain: Secondary | ICD-10-CM | POA: Diagnosis not present

## 2013-04-19 DIAGNOSIS — N281 Cyst of kidney, acquired: Secondary | ICD-10-CM | POA: Diagnosis not present

## 2013-05-01 DIAGNOSIS — J309 Allergic rhinitis, unspecified: Secondary | ICD-10-CM | POA: Diagnosis not present

## 2013-05-01 DIAGNOSIS — J019 Acute sinusitis, unspecified: Secondary | ICD-10-CM | POA: Diagnosis not present

## 2013-05-16 ENCOUNTER — Other Ambulatory Visit: Payer: Self-pay | Admitting: Urology

## 2013-05-16 DIAGNOSIS — R109 Unspecified abdominal pain: Secondary | ICD-10-CM | POA: Diagnosis not present

## 2013-05-16 DIAGNOSIS — N3941 Urge incontinence: Secondary | ICD-10-CM | POA: Diagnosis not present

## 2013-05-16 DIAGNOSIS — M542 Cervicalgia: Secondary | ICD-10-CM | POA: Diagnosis not present

## 2013-06-06 ENCOUNTER — Encounter (HOSPITAL_BASED_OUTPATIENT_CLINIC_OR_DEPARTMENT_OTHER): Payer: Self-pay | Admitting: *Deleted

## 2013-06-08 ENCOUNTER — Encounter (HOSPITAL_BASED_OUTPATIENT_CLINIC_OR_DEPARTMENT_OTHER): Payer: Self-pay | Admitting: *Deleted

## 2013-06-08 NOTE — Progress Notes (Signed)
NPO AFTER MN. ARRIVES AT 0600. NEEDS HG. WILL TAKE SYNTHROID AND DEXILANT AM OF SURG W/ SIPS OF WATER.

## 2013-06-11 DIAGNOSIS — H01009 Unspecified blepharitis unspecified eye, unspecified eyelid: Secondary | ICD-10-CM | POA: Diagnosis not present

## 2013-06-11 DIAGNOSIS — H35379 Puckering of macula, unspecified eye: Secondary | ICD-10-CM | POA: Diagnosis not present

## 2013-06-11 DIAGNOSIS — H04129 Dry eye syndrome of unspecified lacrimal gland: Secondary | ICD-10-CM | POA: Diagnosis not present

## 2013-06-12 DIAGNOSIS — M47817 Spondylosis without myelopathy or radiculopathy, lumbosacral region: Secondary | ICD-10-CM | POA: Diagnosis not present

## 2013-06-12 DIAGNOSIS — G894 Chronic pain syndrome: Secondary | ICD-10-CM | POA: Diagnosis not present

## 2013-06-12 DIAGNOSIS — M47812 Spondylosis without myelopathy or radiculopathy, cervical region: Secondary | ICD-10-CM | POA: Diagnosis not present

## 2013-06-12 DIAGNOSIS — M1990 Unspecified osteoarthritis, unspecified site: Secondary | ICD-10-CM | POA: Diagnosis not present

## 2013-06-13 NOTE — H&P (Signed)
History of Present Illness   I was consulted by Dr. Clelia Croft regarding April Berg's suprapubic pressure that has been present for many months possibly gradually getting worse. It is relieved for a few moments with voiding but returns quickly. It comes and goes. It flares and she has had negative cultures and at least 1 positive culture.   She has uncommon urinary incontinence, which is less since she lost weight and stopped drinking tea and artificial sweeteners. She wears 1 liner a day for confidence. She voids every 2-3 hours and gets up once a night. She reports a good flow and feels empty.   She denies a history of kidney stones, previous GU surgery, and she used to get urinary tract infections. She has had neck surgery. She had a hysterectomy. Her bowel function is normal.   She has vaginal dryness and dyspareunia. Estrace cream and Estring ring helped the dryness but caused headaches and did not help her suprapubic pressure.  There is no other modifying factors or associated signs or symptoms. There is no other aggravating or relieving factors. The symptoms are moderate in severity and persistent.    Past Medical History Problems  1. History of  Arthritis V13.4 2. History of  Depression 311 3. History of  Esophageal Reflux 530.81 4. History of  Glaucoma 365.9 5. History of  Hypothyroidism 244.9  Surgical History Problems  1. History of  Cataract Surgery 2. History of  Cesarean Section 3. History of  Eye Surgery 4. History of  Hysterectomy V45.77 5. History of  Neck Surgery 6. History of  Rhinoplasty  Current Meds 1. Allegra Allergy 180 MG Oral Tablet; Therapy: (Recorded:28May2014) to 2. Calcium TABS; Therapy: (Recorded:28May2014) to 3. Cymbalta 30 MG Oral Capsule Delayed Release Particles; Therapy: (Recorded:28May2014) to 4. Dexilant 60 MG Oral Capsule Delayed Release; Therapy: (Recorded:28May2014) to 5. Flexeril 10 MG TABS; Therapy: (Recorded:28May2014) to 6.  Hydrocodone-Acetaminophen 5-325 MG Oral Tablet; Therapy: (Recorded:28May2014) to 7. Imitrex 100 MG Oral Tablet; Therapy: (Recorded:28May2014) to 8. Synthroid 112 MCG Oral Tablet; Therapy: (Recorded:28May2014) to 9. Travatan Z 0.004 % Ophthalmic Solution; Therapy: (Recorded:28May2014) to 10. Vitamin D TABS; Therapy: (Recorded:28May2014) to  Allergies Medication  1. Avelox TABS 2. Sulfa Drugs 3. Aspirin TABS 4. NSAIDs  Family History Problems  1. Maternal history of  Death In The Family Mother mother passed @ age 85pulmonary fibrosis 2. Sororal history of  Esophageal Reflux 3. Paternal history of  Esophageal Reflux 4. Maternal history of  Esophageal Reflux 5. Sororal history of  Esophageal Reflux 6. Family history of  Family Health Status Number Of Children 1 daughter 40. Paternal history of  Migraine Headache 8. Maternal history of  Migraine Headache 9. Sororal history of  Migraine Headache 10. Paternal history of  Nephrolithiasis 11. Maternal history of  Osteoarthritis V17.7 12. Paternal history of  Osteoarthritis V17.7 13. Sororal history of  Osteoarthritis V17.7  Social History Problems    Alcohol Use one drink daily   Marital History - Currently Married   Never A Smoker   Occupation: Retired Denied    History of  Caffeine Use   History of  Tobacco Use  Review of Systems Genitourinary, constitutional, skin, eye, otolaryngeal, hematologic/lymphatic, cardiovascular, pulmonary, endocrine, neurological and psychiatric system(s) were reviewed and pertinent findings if present are noted.  Genitourinary: incontinence.  Gastrointestinal: abdominal pain.  Musculoskeletal: back pain and joint pain.    Vitals Vital Signs [Data Includes: Last 1 Day]  28May2014 02:23PM  BMI Calculated: 26.68 BSA Calculated: 1.66 Height: 5  ft 2 in Weight: 145 lb  Blood Pressure: 153 / 90 Temperature: 98.8 F Heart Rate: 105  Physical Exam Constitutional: Well nourished and well  developed . No acute distress.  ENT:. The ears and nose are normal in appearance.  Neck: The appearance of the neck is normal and no neck mass is present.  Pulmonary: No respiratory distress and normal respiratory rhythm and effort.  Cardiovascular: Heart rate and rhythm are normal . No peripheral edema.  Abdomen: The abdomen is soft and nontender. No masses are palpated. No CVA tenderness. No hernias are palpable. No hepatosplenomegaly noted.  Genitourinary:  Chaperone Present: .  Examination of the external genitalia shows normal female external genitalia and no lesions. The urethra is normal in appearance and not tender. There is no urethral mass. Vaginal exam demonstrates no abnormalities. The adnexa are palpably normal. The bladder is non tender and not distended. The anus is normal on inspection. The perineum is normal on inspection.  Lymphatics: The femoral and inguinal nodes are not enlarged or tender.  Skin: Normal skin turgor, no visible rash and no visible skin lesions.  Neuro/Psych:. Mood and affect are appropriate.    Results/Data   Today she underwent a number of tests, which I personally reviewed.   Urinalysis: Negative.  Bladder Scan: Bladder scan residual was 44 mL. Urine [Data Includes: Last 1 Day]   28May2014  COLOR YELLOW   APPEARANCE CLEAR   SPECIFIC GRAVITY 1.015   pH 6.0   GLUCOSE NEG mg/dL  BILIRUBIN NEG   KETONE NEG mg/dL  BLOOD NEG   PROTEIN NEG mg/dL  UROBILINOGEN 0.2 mg/dL  NITRITE NEG   LEUKOCYTE ESTERASE NEG    Assessment Assessed  1. Abdominal Pain 789.00 2. Urge Incontinence Of Urine 788.31  Plan Health Maintenance (V70.0)  1. UA With REFLEX  Done: 28May2014 01:42PM  Discussion/Summary   Dr. Clelia Croft is concerned that April Berg may have interstitial cystitis. She has a lot of suprapubic pressure. In the last several weeks, she has been having flares with a more severe pain in the right and left lower-quadrant that comes and goes and is  different. She has not been x-rayed. She has mild nocturia and uncommon urinary incontinence.   April Berg is going to return in the next week with a CT scan. I went over a hydrodistension.  We talked about cystoscopy/hydrodistension and instillation in detail. Pros, cons, general surgical and anesthetic risks, and other options including watchful waiting were discussed. Risks were described but not limited to pain, infection, and bleeding. The risk of bladder perforation and management were discussed. The patient understands that it is primarily a diagnostic procedure.   We are hoping the CT scan is normal and then we will proceed with hydrodistension.  I am going to send a copy of my note to Dr. Martha Clan to keep him updated on her treatment course.  After a thorough review of the management options for the patient's condition the patient  elected to proceed with surgical therapy as noted above. We have discussed the potential benefits and risks of the procedure, side effects of the proposed treatment, the likelihood of the patient achieving the goals of the procedure, and any potential problems that might occur during the procedure or recuperation. Informed consent has been obtained.

## 2013-06-14 ENCOUNTER — Encounter (HOSPITAL_BASED_OUTPATIENT_CLINIC_OR_DEPARTMENT_OTHER): Payer: Self-pay | Admitting: Anesthesiology

## 2013-06-14 ENCOUNTER — Ambulatory Visit (HOSPITAL_BASED_OUTPATIENT_CLINIC_OR_DEPARTMENT_OTHER)
Admission: RE | Admit: 2013-06-14 | Discharge: 2013-06-14 | Disposition: A | Discharge status: Home / Self Care | Payer: Medicare Other | Source: Ambulatory Visit | Attending: Urology | Admitting: Urology

## 2013-06-14 ENCOUNTER — Ambulatory Visit (HOSPITAL_BASED_OUTPATIENT_CLINIC_OR_DEPARTMENT_OTHER): Payer: Medicare Other | Admitting: Anesthesiology

## 2013-06-14 ENCOUNTER — Encounter (HOSPITAL_BASED_OUTPATIENT_CLINIC_OR_DEPARTMENT_OTHER)
Admission: RE | Disposition: A | Discharge status: Home / Self Care | Payer: Self-pay | Source: Ambulatory Visit | Attending: Urology

## 2013-06-14 DIAGNOSIS — H409 Unspecified glaucoma: Secondary | ICD-10-CM | POA: Insufficient documentation

## 2013-06-14 DIAGNOSIS — N949 Unspecified condition associated with female genital organs and menstrual cycle: Secondary | ICD-10-CM | POA: Insufficient documentation

## 2013-06-14 DIAGNOSIS — K219 Gastro-esophageal reflux disease without esophagitis: Secondary | ICD-10-CM | POA: Insufficient documentation

## 2013-06-14 DIAGNOSIS — R32 Unspecified urinary incontinence: Secondary | ICD-10-CM | POA: Insufficient documentation

## 2013-06-14 DIAGNOSIS — Z90710 Acquired absence of both cervix and uterus: Secondary | ICD-10-CM | POA: Insufficient documentation

## 2013-06-14 DIAGNOSIS — Z711 Person with feared health complaint in whom no diagnosis is made: Secondary | ICD-10-CM | POA: Diagnosis not present

## 2013-06-14 DIAGNOSIS — IMO0002 Reserved for concepts with insufficient information to code with codable children: Secondary | ICD-10-CM | POA: Diagnosis not present

## 2013-06-14 DIAGNOSIS — N35919 Unspecified urethral stricture, male, unspecified site: Secondary | ICD-10-CM | POA: Diagnosis not present

## 2013-06-14 DIAGNOSIS — E039 Hypothyroidism, unspecified: Secondary | ICD-10-CM | POA: Diagnosis not present

## 2013-06-14 DIAGNOSIS — N3010 Interstitial cystitis (chronic) without hematuria: Secondary | ICD-10-CM | POA: Diagnosis not present

## 2013-06-14 DIAGNOSIS — Z87440 Personal history of urinary (tract) infections: Secondary | ICD-10-CM | POA: Insufficient documentation

## 2013-06-14 DIAGNOSIS — Z79899 Other long term (current) drug therapy: Secondary | ICD-10-CM | POA: Insufficient documentation

## 2013-06-14 HISTORY — DX: Vitamin D deficiency, unspecified: E55.9

## 2013-06-14 HISTORY — DX: Irritable bowel syndrome, unspecified: K58.9

## 2013-06-14 HISTORY — DX: Personal history of other diseases of the digestive system: Z87.19

## 2013-06-14 HISTORY — DX: Unspecified glaucoma: H40.9

## 2013-06-14 HISTORY — DX: Personal history of other diseases of the circulatory system: Z86.79

## 2013-06-14 HISTORY — DX: Pelvic and perineal pain: R10.2

## 2013-06-14 HISTORY — PX: CYSTO WITH HYDRODISTENSION: SHX5453

## 2013-06-14 HISTORY — DX: Pelvic and perineal pain unspecified side: R10.20

## 2013-06-14 SURGERY — CYSTOSCOPY, WITH BLADDER HYDRODISTENSION
Anesthesia: General | Site: Bladder | Wound class: Clean Contaminated

## 2013-06-14 MED ORDER — STERILE WATER FOR IRRIGATION IR SOLN
Status: DC | PRN
  Administered 2013-06-14: 3000 mL via INTRAVESICAL

## 2013-06-14 MED ORDER — PROMETHAZINE HCL 25 MG/ML IJ SOLN
6.2500 mg | INTRAMUSCULAR | Status: DC | PRN
  Filled 2013-06-14: qty 1

## 2013-06-14 MED ORDER — PROPOFOL 10 MG/ML IV BOLUS
INTRAVENOUS | Status: DC | PRN
  Administered 2013-06-14: 200 mg via INTRAVENOUS

## 2013-06-14 MED ORDER — LACTATED RINGERS IV SOLN
INTRAVENOUS | Status: DC
  Administered 2013-06-14: 08:00:00 via INTRAVENOUS
  Filled 2013-06-14: qty 1000

## 2013-06-14 MED ORDER — CEFAZOLIN SODIUM 1-5 GM-% IV SOLN
1.0000 g | INTRAVENOUS | Status: DC
  Filled 2013-06-14: qty 50

## 2013-06-14 MED ORDER — MIDAZOLAM HCL 5 MG/5ML IJ SOLN
INTRAMUSCULAR | Status: DC | PRN
  Administered 2013-06-14: 2 mg via INTRAVENOUS

## 2013-06-14 MED ORDER — CEFAZOLIN SODIUM-DEXTROSE 2-3 GM-% IV SOLR
2.0000 g | INTRAVENOUS | Status: AC
  Administered 2013-06-14: 2 g via INTRAVENOUS
  Filled 2013-06-14: qty 50

## 2013-06-14 MED ORDER — FENTANYL CITRATE 0.05 MG/ML IJ SOLN
25.0000 ug | INTRAMUSCULAR | Status: DC | PRN
  Filled 2013-06-14: qty 1

## 2013-06-14 MED ORDER — FENTANYL CITRATE 0.05 MG/ML IJ SOLN
INTRAMUSCULAR | Status: DC | PRN
  Administered 2013-06-14: 50 ug via INTRAVENOUS

## 2013-06-14 MED ORDER — ACETAMINOPHEN 10 MG/ML IV SOLN
INTRAVENOUS | Status: DC | PRN
  Administered 2013-06-14: 1000 mg via INTRAVENOUS

## 2013-06-14 MED ORDER — HYDROCODONE-ACETAMINOPHEN 5-325 MG PO TABS: 1.0000 | ORAL_TABLET | Freq: Four times a day (QID) | ORAL | Status: DC | PRN

## 2013-06-14 MED ORDER — DEXAMETHASONE SODIUM PHOSPHATE 4 MG/ML IJ SOLN
INTRAMUSCULAR | Status: DC | PRN
  Administered 2013-06-14: 10 mg via INTRAVENOUS

## 2013-06-14 MED ORDER — PHENAZOPYRIDINE HCL 200 MG PO TABS
ORAL | Status: DC | PRN
  Administered 2013-06-14: 08:00:00 via INTRAVESICAL

## 2013-06-14 MED ORDER — ONDANSETRON HCL 4 MG/2ML IJ SOLN
INTRAMUSCULAR | Status: DC | PRN
  Administered 2013-06-14: 4 mg via INTRAVENOUS

## 2013-06-14 MED ORDER — LIDOCAINE HCL (CARDIAC) 20 MG/ML IV SOLN
INTRAVENOUS | Status: DC | PRN
  Administered 2013-06-14: 80 mg via INTRAVENOUS

## 2013-06-14 SURGICAL SUPPLY — 20 items
BAG DRAIN URO-CYSTO SKYTR STRL (DRAIN) ×2 IMPLANT
CANISTER SUCT LVC 12 LTR MEDI- (MISCELLANEOUS) ×2 IMPLANT
CATH FOLEY 2WAY SLVR  5CC 18FR (CATHETERS)
CATH FOLEY 2WAY SLVR 5CC 18FR (CATHETERS) IMPLANT
CATH ROBINSON RED A/P 12FR (CATHETERS) IMPLANT
CATH ROBINSON RED A/P 14FR (CATHETERS) IMPLANT
CLOTH BEACON ORANGE TIMEOUT ST (SAFETY) ×2 IMPLANT
DRAPE CAMERA CLOSED 9X96 (DRAPES) ×2 IMPLANT
ELECT REM PT RETURN 9FT ADLT (ELECTROSURGICAL)
ELECTRODE REM PT RTRN 9FT ADLT (ELECTROSURGICAL) IMPLANT
GLOVE BIO SURGEON STRL SZ 6.5 (GLOVE) ×2 IMPLANT
GLOVE BIO SURGEON STRL SZ7.5 (GLOVE) ×2 IMPLANT
GLOVE INDICATOR 6.5 STRL GRN (GLOVE) ×2 IMPLANT
GOWN STRL REIN XL XLG (GOWN DISPOSABLE) ×4 IMPLANT
NDL SAFETY ECLIPSE 18X1.5 (NEEDLE) ×1 IMPLANT
NEEDLE HYPO 18GX1.5 SHARP (NEEDLE) ×1
PACK CYSTOSCOPY (CUSTOM PROCEDURE TRAY) ×2 IMPLANT
SUT SILK 0 TIES 10X30 (SUTURE) IMPLANT
SYR 20CC LL (SYRINGE) ×2 IMPLANT
WATER STERILE IRR 3000ML UROMA (IV SOLUTION) ×2 IMPLANT

## 2013-06-14 NOTE — Transfer of Care (Signed)
Immediate Anesthesia Transfer of Care Note  Patient: April Berg  Procedure(s) Performed: Procedure(s): CYSTOSCOPY/HYDRODISTENSION/INSTILLATION OF MARCAINE AND PYRIDIUM (N/A)  Patient Location: PACU  Anesthesia Type:General  Level of Consciousness: sedated  Airway & Oxygen Therapy: Patient Spontanous Breathing and Patient connected to face mask oxygen  Post-op Assessment: Report given to PACU RN  Post vital signs: Reviewed and stable  Complications: No apparent anesthesia complications

## 2013-06-14 NOTE — Interval H&P Note (Signed)
History and Physical Interval Note:  06/14/2013 7:20 AM  April Berg  has presented today for surgery, with the diagnosis of pelvic pain  The various methods of treatment have been discussed with the patient and family. After consideration of risks, benefits and other options for treatment, the patient has consented to  Procedure(s): CYSTOSCOPY/HYDRODISTENSION/INSTILLATION OF MARCAINE AND PYRIDIUM (N/A) as a surgical intervention .  The patient's history has been reviewed, patient examined, no change in status, stable for surgery.  I have reviewed the patient's chart and labs.  Questions were answered to the patient's satisfaction.     Lovenia Debruler A

## 2013-06-14 NOTE — Anesthesia Postprocedure Evaluation (Signed)
  Anesthesia Post-op Note  Patient: April Berg  Procedure(s) Performed: Procedure(s) (LRB): CYSTOSCOPY/HYDRODISTENSION/INSTILLATION OF MARCAINE AND PYRIDIUM (N/A)  Patient Location: PACU  Anesthesia Type: General  Level of Consciousness: awake and alert   Airway and Oxygen Therapy: Patient Spontanous Breathing  Post-op Pain: mild  Post-op Assessment: Post-op Vital signs reviewed, Patient's Cardiovascular Status Stable, Respiratory Function Stable, Patent Airway and No signs of Nausea or vomiting  Last Vitals:  Filed Vitals:   06/14/13 0845  BP: 133/82  Pulse: 83  Temp:   Resp: 19    Post-op Vital Signs: stable   Complications: No apparent anesthesia complications

## 2013-06-14 NOTE — Op Note (Signed)
Preoperative diagnosis: Pelvic pain Postoperative diagnosis: Pelvic pain and meatal stenosis Surgery: Cystoscopy bladder hydrodistention and bladder installation therapy and urethral dilation Surgeon: Dr. Lorin Picket Braylan Faul  The patient consented above procedure the above diagnoses. Preoperative antibiotics were given.  I initially had cystoscoped the patient a 36 [place] well-lubricated scope the obturator but could not. She had mild meatal stenosis. She gently dilated with female sounds well-lubricated from 16-22 [place]. She had minimal bleeding at the meatus but no trauma.  I cystoscoped the patient. Trigone was normal. Is no stitch or foreign body or carcinoma. Feeling was normal. She was hydrodistended to 600 mL.  Bladder was emptied  On reinspection the bladder findings were the same with no glomerulations or findings in keeping with a diagnosis of interstitial cystitis  As a separate procedure after inking the bladder instilled 15 cc of 0.5% Marcaine was 400 mg of peridium  The patient be followed as per protocol

## 2013-06-14 NOTE — Anesthesia Preprocedure Evaluation (Addendum)
Anesthesia Evaluation  Patient identified by MRN, date of birth, ID band Patient awake    Reviewed: Allergy & Precautions, H&P , NPO status , Patient's Chart, lab work & pertinent test results  Airway Mallampati: II TM Distance: >3 FB Neck ROM: Full    Dental no notable dental hx.    Pulmonary neg pulmonary ROS,  breath sounds clear to auscultation  Pulmonary exam normal       Cardiovascular Exercise Tolerance: Good negative cardio ROS  Rhythm:Regular Rate:Normal  CXR and ECG from 12-08-11 reviewed.   Neuro/Psych  Headaches, PSYCHIATRIC DISORDERS Depression    GI/Hepatic Neg liver ROS, hiatal hernia, GERD-  Medicated,  Endo/Other  Hypothyroidism   Renal/GU negative Renal ROS  negative genitourinary   Musculoskeletal negative musculoskeletal ROS (+)   Abdominal   Peds negative pediatric ROS (+)  Hematology negative hematology ROS (+)   Anesthesia Other Findings   Reproductive/Obstetrics negative OB ROS                          Anesthesia Physical Anesthesia Plan  ASA: II  Anesthesia Plan: General   Post-op Pain Management:    Induction: Intravenous  Airway Management Planned: LMA  Additional Equipment:   Intra-op Plan:   Post-operative Plan: Extubation in OR  Informed Consent: I have reviewed the patients History and Physical, chart, labs and discussed the procedure including the risks, benefits and alternatives for the proposed anesthesia with the patient or authorized representative who has indicated his/her understanding and acceptance.   Dental advisory given  Plan Discussed with: CRNA  Anesthesia Plan Comments:         Anesthesia Quick Evaluation

## 2013-06-14 NOTE — Anesthesia Procedure Notes (Signed)
Procedure Name: LMA Insertion Date/Time: 06/14/2013 7:36 AM Performed by: Maris Berger T Pre-anesthesia Checklist: Patient identified, Emergency Drugs available, Suction available and Patient being monitored Patient Re-evaluated:Patient Re-evaluated prior to inductionOxygen Delivery Method: Circle System Utilized Preoxygenation: Pre-oxygenation with 100% oxygen Intubation Type: IV induction Ventilation: Mask ventilation without difficulty LMA: LMA inserted LMA Size: 4.0 Number of attempts: 1 Airway Equipment and Method: bite block Placement Confirmation: positive ETCO2 Dental Injury: Teeth and Oropharynx as per pre-operative assessment

## 2013-06-15 ENCOUNTER — Encounter (HOSPITAL_BASED_OUTPATIENT_CLINIC_OR_DEPARTMENT_OTHER): Payer: Self-pay | Admitting: Urology

## 2013-06-25 DIAGNOSIS — N3020 Other chronic cystitis without hematuria: Secondary | ICD-10-CM | POA: Diagnosis not present

## 2013-06-25 DIAGNOSIS — N3941 Urge incontinence: Secondary | ICD-10-CM | POA: Diagnosis not present

## 2013-07-12 DIAGNOSIS — G894 Chronic pain syndrome: Secondary | ICD-10-CM | POA: Diagnosis not present

## 2013-07-12 DIAGNOSIS — M47817 Spondylosis without myelopathy or radiculopathy, lumbosacral region: Secondary | ICD-10-CM | POA: Diagnosis not present

## 2013-07-12 DIAGNOSIS — H52209 Unspecified astigmatism, unspecified eye: Secondary | ICD-10-CM | POA: Diagnosis not present

## 2013-07-12 DIAGNOSIS — M47812 Spondylosis without myelopathy or radiculopathy, cervical region: Secondary | ICD-10-CM | POA: Diagnosis not present

## 2013-07-12 DIAGNOSIS — H04129 Dry eye syndrome of unspecified lacrimal gland: Secondary | ICD-10-CM | POA: Diagnosis not present

## 2013-07-12 DIAGNOSIS — M159 Polyosteoarthritis, unspecified: Secondary | ICD-10-CM | POA: Diagnosis not present

## 2013-07-12 DIAGNOSIS — H01009 Unspecified blepharitis unspecified eye, unspecified eyelid: Secondary | ICD-10-CM | POA: Diagnosis not present

## 2013-07-13 DIAGNOSIS — M2240 Chondromalacia patellae, unspecified knee: Secondary | ICD-10-CM | POA: Diagnosis not present

## 2013-07-17 DIAGNOSIS — M2240 Chondromalacia patellae, unspecified knee: Secondary | ICD-10-CM | POA: Diagnosis not present

## 2013-07-18 DIAGNOSIS — N3020 Other chronic cystitis without hematuria: Secondary | ICD-10-CM | POA: Diagnosis not present

## 2013-07-18 DIAGNOSIS — R300 Dysuria: Secondary | ICD-10-CM | POA: Diagnosis not present

## 2013-07-30 DIAGNOSIS — R351 Nocturia: Secondary | ICD-10-CM | POA: Diagnosis not present

## 2013-07-30 DIAGNOSIS — R109 Unspecified abdominal pain: Secondary | ICD-10-CM | POA: Diagnosis not present

## 2013-08-08 DIAGNOSIS — M47817 Spondylosis without myelopathy or radiculopathy, lumbosacral region: Secondary | ICD-10-CM | POA: Diagnosis not present

## 2013-08-08 DIAGNOSIS — M47812 Spondylosis without myelopathy or radiculopathy, cervical region: Secondary | ICD-10-CM | POA: Diagnosis not present

## 2013-08-08 DIAGNOSIS — G894 Chronic pain syndrome: Secondary | ICD-10-CM | POA: Diagnosis not present

## 2013-08-13 DIAGNOSIS — H8109 Meniere's disease, unspecified ear: Secondary | ICD-10-CM | POA: Diagnosis not present

## 2013-08-13 DIAGNOSIS — H6980 Other specified disorders of Eustachian tube, unspecified ear: Secondary | ICD-10-CM | POA: Diagnosis not present

## 2013-08-13 DIAGNOSIS — H908 Mixed conductive and sensorineural hearing loss, unspecified: Secondary | ICD-10-CM | POA: Diagnosis not present

## 2013-08-13 DIAGNOSIS — R29818 Other symptoms and signs involving the nervous system: Secondary | ICD-10-CM | POA: Diagnosis not present

## 2013-08-13 DIAGNOSIS — H905 Unspecified sensorineural hearing loss: Secondary | ICD-10-CM | POA: Diagnosis not present

## 2013-08-13 DIAGNOSIS — J309 Allergic rhinitis, unspecified: Secondary | ICD-10-CM | POA: Diagnosis not present

## 2013-08-13 DIAGNOSIS — G43809 Other migraine, not intractable, without status migrainosus: Secondary | ICD-10-CM | POA: Diagnosis not present

## 2013-08-13 DIAGNOSIS — G43019 Migraine without aura, intractable, without status migrainosus: Secondary | ICD-10-CM | POA: Diagnosis not present

## 2013-08-14 DIAGNOSIS — H4011X0 Primary open-angle glaucoma, stage unspecified: Secondary | ICD-10-CM | POA: Diagnosis not present

## 2013-08-14 DIAGNOSIS — H409 Unspecified glaucoma: Secondary | ICD-10-CM | POA: Diagnosis not present

## 2013-08-15 DIAGNOSIS — J387 Other diseases of larynx: Secondary | ICD-10-CM | POA: Diagnosis not present

## 2013-08-15 DIAGNOSIS — K219 Gastro-esophageal reflux disease without esophagitis: Secondary | ICD-10-CM | POA: Diagnosis not present

## 2013-08-15 DIAGNOSIS — J019 Acute sinusitis, unspecified: Secondary | ICD-10-CM | POA: Diagnosis not present

## 2013-08-15 DIAGNOSIS — J309 Allergic rhinitis, unspecified: Secondary | ICD-10-CM | POA: Diagnosis not present

## 2013-08-16 DIAGNOSIS — L821 Other seborrheic keratosis: Secondary | ICD-10-CM | POA: Diagnosis not present

## 2013-08-16 DIAGNOSIS — L819 Disorder of pigmentation, unspecified: Secondary | ICD-10-CM | POA: Diagnosis not present

## 2013-08-16 DIAGNOSIS — D1801 Hemangioma of skin and subcutaneous tissue: Secondary | ICD-10-CM | POA: Diagnosis not present

## 2013-08-16 DIAGNOSIS — L570 Actinic keratosis: Secondary | ICD-10-CM | POA: Diagnosis not present

## 2013-08-16 DIAGNOSIS — D485 Neoplasm of uncertain behavior of skin: Secondary | ICD-10-CM | POA: Diagnosis not present

## 2013-08-17 DIAGNOSIS — Z981 Arthrodesis status: Secondary | ICD-10-CM | POA: Diagnosis not present

## 2013-08-17 DIAGNOSIS — M542 Cervicalgia: Secondary | ICD-10-CM | POA: Diagnosis not present

## 2013-08-17 DIAGNOSIS — M48061 Spinal stenosis, lumbar region without neurogenic claudication: Secondary | ICD-10-CM | POA: Diagnosis not present

## 2013-08-17 DIAGNOSIS — M25519 Pain in unspecified shoulder: Secondary | ICD-10-CM | POA: Diagnosis not present

## 2013-08-17 DIAGNOSIS — M5412 Radiculopathy, cervical region: Secondary | ICD-10-CM | POA: Diagnosis not present

## 2013-08-21 ENCOUNTER — Other Ambulatory Visit: Payer: Self-pay

## 2013-08-21 DIAGNOSIS — Z1231 Encounter for screening mammogram for malignant neoplasm of breast: Secondary | ICD-10-CM

## 2013-08-21 DIAGNOSIS — Z23 Encounter for immunization: Secondary | ICD-10-CM | POA: Diagnosis not present

## 2013-09-04 DIAGNOSIS — M47812 Spondylosis without myelopathy or radiculopathy, cervical region: Secondary | ICD-10-CM | POA: Diagnosis not present

## 2013-09-04 DIAGNOSIS — G894 Chronic pain syndrome: Secondary | ICD-10-CM | POA: Diagnosis not present

## 2013-09-04 DIAGNOSIS — M47817 Spondylosis without myelopathy or radiculopathy, lumbosacral region: Secondary | ICD-10-CM | POA: Diagnosis not present

## 2013-09-04 DIAGNOSIS — M159 Polyosteoarthritis, unspecified: Secondary | ICD-10-CM | POA: Diagnosis not present

## 2013-09-14 DIAGNOSIS — IMO0002 Reserved for concepts with insufficient information to code with codable children: Secondary | ICD-10-CM | POA: Diagnosis not present

## 2013-09-14 DIAGNOSIS — M48061 Spinal stenosis, lumbar region without neurogenic claudication: Secondary | ICD-10-CM | POA: Diagnosis not present

## 2013-09-21 DIAGNOSIS — R109 Unspecified abdominal pain: Secondary | ICD-10-CM | POA: Diagnosis not present

## 2013-09-21 DIAGNOSIS — N3020 Other chronic cystitis without hematuria: Secondary | ICD-10-CM | POA: Diagnosis not present

## 2013-09-24 DIAGNOSIS — H409 Unspecified glaucoma: Secondary | ICD-10-CM | POA: Diagnosis not present

## 2013-09-24 DIAGNOSIS — H4011X0 Primary open-angle glaucoma, stage unspecified: Secondary | ICD-10-CM | POA: Diagnosis not present

## 2013-09-26 ENCOUNTER — Ambulatory Visit
Admission: RE | Admit: 2013-09-26 | Discharge: 2013-09-26 | Disposition: A | Discharge status: Home / Self Care | Payer: Medicare Other | Source: Ambulatory Visit

## 2013-09-26 DIAGNOSIS — R109 Unspecified abdominal pain: Secondary | ICD-10-CM | POA: Diagnosis not present

## 2013-09-26 DIAGNOSIS — Z1231 Encounter for screening mammogram for malignant neoplasm of breast: Secondary | ICD-10-CM

## 2013-09-26 DIAGNOSIS — N3020 Other chronic cystitis without hematuria: Secondary | ICD-10-CM | POA: Diagnosis not present

## 2013-10-11 DIAGNOSIS — N3020 Other chronic cystitis without hematuria: Secondary | ICD-10-CM | POA: Diagnosis not present

## 2013-10-11 DIAGNOSIS — R109 Unspecified abdominal pain: Secondary | ICD-10-CM | POA: Diagnosis not present

## 2013-10-29 DIAGNOSIS — M159 Polyosteoarthritis, unspecified: Secondary | ICD-10-CM | POA: Diagnosis not present

## 2013-10-29 DIAGNOSIS — M47812 Spondylosis without myelopathy or radiculopathy, cervical region: Secondary | ICD-10-CM | POA: Diagnosis not present

## 2013-10-29 DIAGNOSIS — M47817 Spondylosis without myelopathy or radiculopathy, lumbosacral region: Secondary | ICD-10-CM | POA: Diagnosis not present

## 2013-10-29 DIAGNOSIS — G894 Chronic pain syndrome: Secondary | ICD-10-CM | POA: Diagnosis not present

## 2013-11-12 DIAGNOSIS — R109 Unspecified abdominal pain: Secondary | ICD-10-CM | POA: Diagnosis not present

## 2013-11-12 DIAGNOSIS — N3020 Other chronic cystitis without hematuria: Secondary | ICD-10-CM | POA: Diagnosis not present

## 2013-11-29 DIAGNOSIS — L570 Actinic keratosis: Secondary | ICD-10-CM | POA: Diagnosis not present

## 2013-11-30 DIAGNOSIS — M542 Cervicalgia: Secondary | ICD-10-CM | POA: Diagnosis not present

## 2013-11-30 DIAGNOSIS — M48061 Spinal stenosis, lumbar region without neurogenic claudication: Secondary | ICD-10-CM | POA: Diagnosis not present

## 2013-11-30 DIAGNOSIS — M25519 Pain in unspecified shoulder: Secondary | ICD-10-CM | POA: Diagnosis not present

## 2013-12-06 DIAGNOSIS — J387 Other diseases of larynx: Secondary | ICD-10-CM | POA: Diagnosis not present

## 2013-12-06 DIAGNOSIS — H908 Mixed conductive and sensorineural hearing loss, unspecified: Secondary | ICD-10-CM | POA: Diagnosis not present

## 2013-12-06 DIAGNOSIS — H9319 Tinnitus, unspecified ear: Secondary | ICD-10-CM | POA: Diagnosis not present

## 2013-12-06 DIAGNOSIS — J329 Chronic sinusitis, unspecified: Secondary | ICD-10-CM | POA: Diagnosis not present

## 2013-12-06 DIAGNOSIS — R29818 Other symptoms and signs involving the nervous system: Secondary | ICD-10-CM | POA: Diagnosis not present

## 2013-12-06 DIAGNOSIS — H8109 Meniere's disease, unspecified ear: Secondary | ICD-10-CM | POA: Diagnosis not present

## 2013-12-06 DIAGNOSIS — J309 Allergic rhinitis, unspecified: Secondary | ICD-10-CM | POA: Diagnosis not present

## 2013-12-11 DIAGNOSIS — H5710 Ocular pain, unspecified eye: Secondary | ICD-10-CM | POA: Diagnosis not present

## 2013-12-13 DIAGNOSIS — M48061 Spinal stenosis, lumbar region without neurogenic claudication: Secondary | ICD-10-CM | POA: Diagnosis not present

## 2013-12-13 DIAGNOSIS — M542 Cervicalgia: Secondary | ICD-10-CM | POA: Diagnosis not present

## 2013-12-14 DIAGNOSIS — G43019 Migraine without aura, intractable, without status migrainosus: Secondary | ICD-10-CM | POA: Diagnosis not present

## 2013-12-14 DIAGNOSIS — G43809 Other migraine, not intractable, without status migrainosus: Secondary | ICD-10-CM | POA: Diagnosis not present

## 2013-12-14 DIAGNOSIS — G43719 Chronic migraine without aura, intractable, without status migrainosus: Secondary | ICD-10-CM | POA: Diagnosis not present

## 2013-12-19 DIAGNOSIS — H5710 Ocular pain, unspecified eye: Secondary | ICD-10-CM | POA: Diagnosis not present

## 2013-12-19 DIAGNOSIS — H01009 Unspecified blepharitis unspecified eye, unspecified eyelid: Secondary | ICD-10-CM | POA: Diagnosis not present

## 2013-12-19 DIAGNOSIS — H4010X0 Unspecified open-angle glaucoma, stage unspecified: Secondary | ICD-10-CM | POA: Diagnosis not present

## 2013-12-20 DIAGNOSIS — M48061 Spinal stenosis, lumbar region without neurogenic claudication: Secondary | ICD-10-CM | POA: Diagnosis not present

## 2013-12-20 DIAGNOSIS — M5126 Other intervertebral disc displacement, lumbar region: Secondary | ICD-10-CM | POA: Diagnosis not present

## 2014-02-05 DIAGNOSIS — E039 Hypothyroidism, unspecified: Secondary | ICD-10-CM | POA: Diagnosis not present

## 2014-02-05 DIAGNOSIS — R5381 Other malaise: Secondary | ICD-10-CM | POA: Diagnosis not present

## 2014-02-05 DIAGNOSIS — F3289 Other specified depressive episodes: Secondary | ICD-10-CM | POA: Diagnosis not present

## 2014-02-05 DIAGNOSIS — J309 Allergic rhinitis, unspecified: Secondary | ICD-10-CM | POA: Diagnosis not present

## 2014-02-05 DIAGNOSIS — Z6826 Body mass index (BMI) 26.0-26.9, adult: Secondary | ICD-10-CM | POA: Diagnosis not present

## 2014-02-05 DIAGNOSIS — R5383 Other fatigue: Secondary | ICD-10-CM | POA: Diagnosis not present

## 2014-02-05 DIAGNOSIS — F329 Major depressive disorder, single episode, unspecified: Secondary | ICD-10-CM | POA: Diagnosis not present

## 2014-02-05 DIAGNOSIS — Z1331 Encounter for screening for depression: Secondary | ICD-10-CM | POA: Diagnosis not present

## 2014-02-07 DIAGNOSIS — D72829 Elevated white blood cell count, unspecified: Secondary | ICD-10-CM | POA: Diagnosis not present

## 2014-02-07 DIAGNOSIS — J029 Acute pharyngitis, unspecified: Secondary | ICD-10-CM | POA: Diagnosis not present

## 2014-02-11 DIAGNOSIS — M47817 Spondylosis without myelopathy or radiculopathy, lumbosacral region: Secondary | ICD-10-CM | POA: Diagnosis not present

## 2014-02-11 DIAGNOSIS — G894 Chronic pain syndrome: Secondary | ICD-10-CM | POA: Diagnosis not present

## 2014-02-11 DIAGNOSIS — M159 Polyosteoarthritis, unspecified: Secondary | ICD-10-CM | POA: Diagnosis not present

## 2014-02-11 DIAGNOSIS — M47812 Spondylosis without myelopathy or radiculopathy, cervical region: Secondary | ICD-10-CM | POA: Diagnosis not present

## 2014-02-19 DIAGNOSIS — H409 Unspecified glaucoma: Secondary | ICD-10-CM | POA: Diagnosis not present

## 2014-02-19 DIAGNOSIS — H4011X0 Primary open-angle glaucoma, stage unspecified: Secondary | ICD-10-CM | POA: Diagnosis not present

## 2014-03-15 DIAGNOSIS — M5412 Radiculopathy, cervical region: Secondary | ICD-10-CM | POA: Diagnosis not present

## 2014-03-15 DIAGNOSIS — M25519 Pain in unspecified shoulder: Secondary | ICD-10-CM | POA: Diagnosis not present

## 2014-03-15 DIAGNOSIS — M542 Cervicalgia: Secondary | ICD-10-CM | POA: Diagnosis not present

## 2014-04-03 DIAGNOSIS — M899 Disorder of bone, unspecified: Secondary | ICD-10-CM | POA: Diagnosis not present

## 2014-04-03 DIAGNOSIS — I10 Essential (primary) hypertension: Secondary | ICD-10-CM | POA: Diagnosis not present

## 2014-04-03 DIAGNOSIS — E785 Hyperlipidemia, unspecified: Secondary | ICD-10-CM | POA: Diagnosis not present

## 2014-04-03 DIAGNOSIS — E039 Hypothyroidism, unspecified: Secondary | ICD-10-CM | POA: Diagnosis not present

## 2014-04-03 DIAGNOSIS — R82998 Other abnormal findings in urine: Secondary | ICD-10-CM | POA: Diagnosis not present

## 2014-04-03 DIAGNOSIS — M949 Disorder of cartilage, unspecified: Secondary | ICD-10-CM | POA: Diagnosis not present

## 2014-04-09 DIAGNOSIS — Z888 Allergy status to other drugs, medicaments and biological substances status: Secondary | ICD-10-CM | POA: Diagnosis not present

## 2014-04-09 DIAGNOSIS — Z882 Allergy status to sulfonamides status: Secondary | ICD-10-CM | POA: Diagnosis not present

## 2014-04-09 DIAGNOSIS — K219 Gastro-esophageal reflux disease without esophagitis: Secondary | ICD-10-CM | POA: Diagnosis not present

## 2014-04-09 DIAGNOSIS — J3089 Other allergic rhinitis: Secondary | ICD-10-CM | POA: Diagnosis not present

## 2014-04-09 DIAGNOSIS — M129 Arthropathy, unspecified: Secondary | ICD-10-CM | POA: Diagnosis not present

## 2014-04-09 DIAGNOSIS — J328 Other chronic sinusitis: Secondary | ICD-10-CM | POA: Diagnosis not present

## 2014-04-09 DIAGNOSIS — J387 Other diseases of larynx: Secondary | ICD-10-CM | POA: Diagnosis not present

## 2014-04-09 DIAGNOSIS — Z90710 Acquired absence of both cervix and uterus: Secondary | ICD-10-CM | POA: Diagnosis not present

## 2014-04-09 DIAGNOSIS — E039 Hypothyroidism, unspecified: Secondary | ICD-10-CM | POA: Diagnosis not present

## 2014-04-09 DIAGNOSIS — G43909 Migraine, unspecified, not intractable, without status migrainosus: Secondary | ICD-10-CM | POA: Diagnosis not present

## 2014-04-09 DIAGNOSIS — Z79899 Other long term (current) drug therapy: Secondary | ICD-10-CM | POA: Diagnosis not present

## 2014-04-09 DIAGNOSIS — Z886 Allergy status to analgesic agent status: Secondary | ICD-10-CM | POA: Diagnosis not present

## 2014-04-09 DIAGNOSIS — Z9889 Other specified postprocedural states: Secondary | ICD-10-CM | POA: Diagnosis not present

## 2014-04-10 DIAGNOSIS — Z23 Encounter for immunization: Secondary | ICD-10-CM | POA: Diagnosis not present

## 2014-04-10 DIAGNOSIS — M949 Disorder of cartilage, unspecified: Secondary | ICD-10-CM | POA: Diagnosis not present

## 2014-04-10 DIAGNOSIS — I10 Essential (primary) hypertension: Secondary | ICD-10-CM | POA: Diagnosis not present

## 2014-04-10 DIAGNOSIS — E785 Hyperlipidemia, unspecified: Secondary | ICD-10-CM | POA: Diagnosis not present

## 2014-04-10 DIAGNOSIS — F329 Major depressive disorder, single episode, unspecified: Secondary | ICD-10-CM | POA: Diagnosis not present

## 2014-04-10 DIAGNOSIS — R1310 Dysphagia, unspecified: Secondary | ICD-10-CM | POA: Diagnosis not present

## 2014-04-10 DIAGNOSIS — E039 Hypothyroidism, unspecified: Secondary | ICD-10-CM | POA: Diagnosis not present

## 2014-04-10 DIAGNOSIS — G43909 Migraine, unspecified, not intractable, without status migrainosus: Secondary | ICD-10-CM | POA: Diagnosis not present

## 2014-04-10 DIAGNOSIS — Z0000 Encounter for general adult medical examination without abnormal findings: Secondary | ICD-10-CM | POA: Diagnosis not present

## 2014-04-10 DIAGNOSIS — M899 Disorder of bone, unspecified: Secondary | ICD-10-CM | POA: Diagnosis not present

## 2014-04-11 ENCOUNTER — Encounter: Payer: Self-pay | Admitting: Internal Medicine

## 2014-04-19 DIAGNOSIS — M542 Cervicalgia: Secondary | ICD-10-CM | POA: Diagnosis not present

## 2014-04-19 DIAGNOSIS — M48061 Spinal stenosis, lumbar region without neurogenic claudication: Secondary | ICD-10-CM | POA: Diagnosis not present

## 2014-04-19 DIAGNOSIS — M545 Low back pain, unspecified: Secondary | ICD-10-CM | POA: Diagnosis not present

## 2014-05-01 DIAGNOSIS — R350 Frequency of micturition: Secondary | ICD-10-CM | POA: Diagnosis not present

## 2014-05-01 DIAGNOSIS — R109 Unspecified abdominal pain: Secondary | ICD-10-CM | POA: Diagnosis not present

## 2014-05-01 DIAGNOSIS — N3020 Other chronic cystitis without hematuria: Secondary | ICD-10-CM | POA: Diagnosis not present

## 2014-05-02 DIAGNOSIS — G894 Chronic pain syndrome: Secondary | ICD-10-CM | POA: Diagnosis not present

## 2014-05-02 DIAGNOSIS — M47817 Spondylosis without myelopathy or radiculopathy, lumbosacral region: Secondary | ICD-10-CM | POA: Diagnosis not present

## 2014-05-02 DIAGNOSIS — M159 Polyosteoarthritis, unspecified: Secondary | ICD-10-CM | POA: Diagnosis not present

## 2014-05-02 DIAGNOSIS — M47812 Spondylosis without myelopathy or radiculopathy, cervical region: Secondary | ICD-10-CM | POA: Diagnosis not present

## 2014-05-31 DIAGNOSIS — N952 Postmenopausal atrophic vaginitis: Secondary | ICD-10-CM | POA: Diagnosis not present

## 2014-06-05 DIAGNOSIS — Z79899 Other long term (current) drug therapy: Secondary | ICD-10-CM | POA: Diagnosis not present

## 2014-06-05 DIAGNOSIS — G43809 Other migraine, not intractable, without status migrainosus: Secondary | ICD-10-CM | POA: Diagnosis not present

## 2014-06-05 DIAGNOSIS — G43719 Chronic migraine without aura, intractable, without status migrainosus: Secondary | ICD-10-CM | POA: Diagnosis not present

## 2014-06-05 DIAGNOSIS — G43019 Migraine without aura, intractable, without status migrainosus: Secondary | ICD-10-CM | POA: Diagnosis not present

## 2014-06-12 ENCOUNTER — Ambulatory Visit: Payer: Medicare Other | Admitting: Internal Medicine

## 2014-06-18 ENCOUNTER — Ambulatory Visit: Payer: Medicare Other | Admitting: Internal Medicine

## 2014-06-20 DIAGNOSIS — J328 Other chronic sinusitis: Secondary | ICD-10-CM | POA: Diagnosis not present

## 2014-06-20 DIAGNOSIS — J309 Allergic rhinitis, unspecified: Secondary | ICD-10-CM | POA: Diagnosis not present

## 2014-06-20 DIAGNOSIS — J3089 Other allergic rhinitis: Secondary | ICD-10-CM | POA: Diagnosis not present

## 2014-06-20 DIAGNOSIS — J329 Chronic sinusitis, unspecified: Secondary | ICD-10-CM | POA: Diagnosis not present

## 2014-06-20 DIAGNOSIS — J387 Other diseases of larynx: Secondary | ICD-10-CM | POA: Diagnosis not present

## 2014-07-08 DIAGNOSIS — H9319 Tinnitus, unspecified ear: Secondary | ICD-10-CM | POA: Diagnosis not present

## 2014-07-08 DIAGNOSIS — R29818 Other symptoms and signs involving the nervous system: Secondary | ICD-10-CM | POA: Diagnosis not present

## 2014-07-08 DIAGNOSIS — H908 Mixed conductive and sensorineural hearing loss, unspecified: Secondary | ICD-10-CM | POA: Diagnosis not present

## 2014-07-08 DIAGNOSIS — H8109 Meniere's disease, unspecified ear: Secondary | ICD-10-CM | POA: Diagnosis not present

## 2014-07-09 ENCOUNTER — Ambulatory Visit (INDEPENDENT_AMBULATORY_CARE_PROVIDER_SITE_OTHER): Payer: Medicare Other | Admitting: Internal Medicine

## 2014-07-09 ENCOUNTER — Encounter: Payer: Self-pay | Admitting: Internal Medicine

## 2014-07-09 VITALS — BP 110/76 | HR 68 | Ht 62.0 in | Wt 146.0 lb

## 2014-07-09 DIAGNOSIS — R1310 Dysphagia, unspecified: Secondary | ICD-10-CM

## 2014-07-09 DIAGNOSIS — K219 Gastro-esophageal reflux disease without esophagitis: Secondary | ICD-10-CM

## 2014-07-09 DIAGNOSIS — Z1211 Encounter for screening for malignant neoplasm of colon: Secondary | ICD-10-CM | POA: Diagnosis not present

## 2014-07-09 MED ORDER — MOVIPREP 100 G PO SOLR: 1.0000 | Freq: Once | ORAL | Status: DC

## 2014-07-09 NOTE — Progress Notes (Signed)
HISTORY OF PRESENT ILLNESS:  April Berg is a 68 y.o. female with multiple medical problems as listed below. She presents today regarding screening colonoscopy and ongoing problems with dysphagia. She was last evaluated in June of 2011 for atypical noncardiac chest pain, GERD, and right-sided abdominal pain. See that dictation. Her last colonoscopy was 10 years ago endocrine [place]. No abnormalities per the patient. In terms of dysphagia, she reports intermittent solid food dysphagia to meat and bread for the past 3-4 years. She has had prior anterior cervical neck fusion around 2013. She thinks problems with dysphagia preceded the procedure, but possibly worse since. She points to the most proximal esophagus when describing dysphagia. No difficulties with liquids. No transient food impaction. She does have a history of GERD but is asymptomatic on Dexilant. She did undergo upper endoscopy August 2009 to evaluate abdominal and chest pain. This was normal. GI review of systems is otherwise negative. No weight loss  REVIEW OF SYSTEMS:  All non-GI ROS negative except for headaches, arthritis, back pain, sinus and allergy  Past Medical History  Diagnosis Date  . Complication of anesthesia 90's    block for elbow surgery and pt had a seizure... surgery since with no problem   . Hypothyroidism   . GERD (gastroesophageal reflux disease)   . Arthritis     osteoarthritis  . Spinal stenosis   . History of pericarditis     DEC 2008 AND SMALL PERICARDIAL EFFUSION--   RESOLVED  . Migraines   . Pelvic pain   . Vitamin D deficiency   . H/O hiatal hernia   . IBS (irritable bowel syndrome)   . Glaucoma of both eyes   . Hyperlipidemia   . Depression     Past Surgical History  Procedure Laterality Date  . Anterior cervical decomp/discectomy fusion  12/08/2011    Procedure: ANTERIOR CERVICAL DECOMPRESSION/DISCECTOMY FUSION 3 LEVELS;  Surgeon: April Ship, MD;  Location: [place];   Service: Orthopedics;  Laterality: N/A;  C 3-6 ACDF  . Cardiac catheterization  10-27-2007  April Berg    NORMAL CORONARY ARTERIES/ NORMAL LVF  . Transthoracic echocardiogram  03-04-2010    MODERATE LVH/ NORMAL LVSF/ MILD DIASTOLIC DYSFUNCTION/ EF 99%/ MILD AI  . Cardiovascular stress test  02-25-2010  April Berg    ANTERIOR ATTENUATION NO SCAR OR ISCHEMIA/ EF 81%  . Tonsillectomy  1951  . Cesarean section  1975  . Elbow tendon release Left 1996  . Thumb tendon transposition Right 2012  . Glaucoma surgery Bilateral 2004  . Cataract extraction w/ intraocular lens  implant, bilateral    . Rhinoplasty  2010  . Total abdominal hysterectomy w/ bilateral salpingoophorectomy  1982  . Cysto with hydrodistension N/A 06/14/2013    Procedure: CYSTOSCOPY/HYDRODISTENSION/INSTILLATION OF MARCAINE AND PYRIDIUM;  Surgeon: April Packer, MD;  Location: [place];  Service: Urology;  Laterality: N/A;    Social History April Berg  reports that she has never smoked. She has never used smokeless tobacco. She reports that she does not drink alcohol or use illicit drugs.  family history includes Lung disease in her father; Pulmonary fibrosis in her mother.  Allergies  Allergen Reactions  . Adhesive [Tape] Other (See Comments)    BLISTERS  . Aspirin Other (See Comments)    Chest pain  . Avelox [Moxifloxacin] Hives  . Neomycin Other (See Comments)    EYE IRRITATION (EYE OINTMENT)  . Nsaids Other (See Comments)    Chest  pain  . Sulfa Antibiotics Hives       PHYSICAL EXAMINATION: Vital signs: BP 110/76  Berg 70  Ht 5\' 2"  (1.575 m)  Wt 146 lb (66.225 kg)  BMI 26.70 kg/m2  Constitutional: generally well-appearing, no acute distress Psychiatric: alert and oriented x3, cooperative Eyes: extraocular movements intact, anicteric, conjunctiva pink Mouth: oral pharynx moist, no lesions Neck: supple no lymphadenopathy Cardiovascular: heart regular rate and rhythm, no  murmur Lungs: clear to auscultation bilaterally Abdomen: soft, nontender, nondistended, no obvious ascites, no peritoneal signs, normal bowel sounds, no organomegaly Rectal: Deferred until colonoscopy Extremities: no lower extremity edema bilaterally Skin: no lesions on visible extremities Neuro: No focal deficits.   ASSESSMENT:  #1. Intermittent solid food dysphagia. Rule out peptic stricture. Rule out problems secondary to cervical neck fusion #2. Screening colonoscopy. Negative exam 10 years ago. Due for followup #3. GERD. Asymptomatic on PPI #4. General medical problems. Stable. Managed by April Berg   PLAN:  #1. Barium esophagram with tablet to rule out extrinsic compression from prior neck surgery #2. Upper endoscopy with possible esophageal dilation post esophagram (pending results).The nature of the procedure, as well as the risks, benefits, and alternatives were carefully and thoroughly reviewed with the patient. Ample time for discussion and questions allowed. The patient understood, was satisfied, and agreed to proceed.  #3. Screening colonoscopy. Movi prep prescribed.The nature of the procedure, as well as the risks, benefits, and alternatives were carefully and thoroughly reviewed with the patient. Ample time for discussion and questions allowed. The patient understood, was satisfied, and agreed to proceed. #4. Continue reflux precautions and PPI

## 2014-07-09 NOTE — Patient Instructions (Signed)
You have been scheduled for a Barium Esophogram at [facility] (1st floor of the hospital) on 07/11/2014 at 10:00am. Please arrive 15 minutes prior to your appointment for registration. Make certain not to have anything to eat or drink 3 hours prior to your test. If you need to reschedule for any reason, please contact radiology at [phone] to do so. __________________________________________________________________ A barium swallow is an examination that concentrates on views of the esophagus. This tends to be a double contrast exam (barium and two liquids which, when combined, create a gas to distend the wall of the oesophagus) or single contrast (non-ionic iodine based). The study is usually tailored to your symptoms so a good history is essential. Attention is paid during the study to the form, structure and configuration of the esophagus, looking for functional disorders (such as aspiration, dysphagia, achalasia, motility and reflux) EXAMINATION You may be asked to change into a gown, depending on the type of swallow being performed. A radiologist and radiographer will perform the procedure. The radiologist will advise you of the type of contrast selected for your procedure and direct you during the exam. You will be asked to stand, sit or lie in several different positions and to hold a small amount of fluid in your mouth before being asked to swallow while the imaging is performed .In some instances you may be asked to swallow barium coated marshmallows to assess the motility of a solid food bolus. The exam can be recorded as a digital or video fluoroscopy procedure. POST PROCEDURE It will take 1-2 days for the barium to pass through your system. To facilitate this, it is important, unless otherwise directed, to increase your fluids for the next 24-48hrs and to resume your normal diet.  This test typically takes about 30 minutes to  perform. _____________________________________________  April Berg have been scheduled for an endoscopy and colonoscopy. Please follow the written instructions given to you at your visit today. Please pick up your prep at the pharmacy within the next 1-3 days. If you use inhalers (even only as needed), please bring them with you on the day of your procedure. Your physician has requested that you go to www.startemmi.com and enter the access code given to you at your visit today. This web site gives a general overview about your procedure. However, you should still follow specific instructions given to you by our office regarding your preparation for the procedure. _____________________________________

## 2014-07-11 ENCOUNTER — Ambulatory Visit (HOSPITAL_COMMUNITY)
Admission: RE | Admit: 2014-07-11 | Discharge: 2014-07-11 | Disposition: A | Discharge status: Home / Self Care | Payer: Medicare Other | Source: Ambulatory Visit | Attending: Internal Medicine | Admitting: Internal Medicine

## 2014-07-11 DIAGNOSIS — K219 Gastro-esophageal reflux disease without esophagitis: Secondary | ICD-10-CM

## 2014-07-11 DIAGNOSIS — R1310 Dysphagia, unspecified: Secondary | ICD-10-CM | POA: Diagnosis not present

## 2014-07-11 DIAGNOSIS — Z1211 Encounter for screening for malignant neoplasm of colon: Secondary | ICD-10-CM | POA: Insufficient documentation

## 2014-07-12 DIAGNOSIS — H409 Unspecified glaucoma: Secondary | ICD-10-CM | POA: Diagnosis not present

## 2014-07-12 DIAGNOSIS — D3130 Benign neoplasm of unspecified choroid: Secondary | ICD-10-CM | POA: Diagnosis not present

## 2014-07-12 DIAGNOSIS — H3530 Unspecified macular degeneration: Secondary | ICD-10-CM | POA: Diagnosis not present

## 2014-07-12 DIAGNOSIS — H4010X0 Unspecified open-angle glaucoma, stage unspecified: Secondary | ICD-10-CM | POA: Diagnosis not present

## 2014-08-01 DIAGNOSIS — M159 Polyosteoarthritis, unspecified: Secondary | ICD-10-CM | POA: Diagnosis not present

## 2014-08-01 DIAGNOSIS — M47817 Spondylosis without myelopathy or radiculopathy, lumbosacral region: Secondary | ICD-10-CM | POA: Diagnosis not present

## 2014-08-01 DIAGNOSIS — G894 Chronic pain syndrome: Secondary | ICD-10-CM | POA: Diagnosis not present

## 2014-08-01 DIAGNOSIS — M47812 Spondylosis without myelopathy or radiculopathy, cervical region: Secondary | ICD-10-CM | POA: Diagnosis not present

## 2014-08-02 DIAGNOSIS — R209 Unspecified disturbances of skin sensation: Secondary | ICD-10-CM | POA: Diagnosis not present

## 2014-08-02 DIAGNOSIS — IMO0002 Reserved for concepts with insufficient information to code with codable children: Secondary | ICD-10-CM | POA: Diagnosis not present

## 2014-08-02 DIAGNOSIS — M5412 Radiculopathy, cervical region: Secondary | ICD-10-CM | POA: Diagnosis not present

## 2014-08-02 DIAGNOSIS — M5126 Other intervertebral disc displacement, lumbar region: Secondary | ICD-10-CM | POA: Diagnosis not present

## 2014-08-02 DIAGNOSIS — M48061 Spinal stenosis, lumbar region without neurogenic claudication: Secondary | ICD-10-CM | POA: Diagnosis not present

## 2014-08-20 DIAGNOSIS — Z981 Arthrodesis status: Secondary | ICD-10-CM | POA: Diagnosis not present

## 2014-08-20 DIAGNOSIS — Z23 Encounter for immunization: Secondary | ICD-10-CM | POA: Diagnosis not present

## 2014-08-20 DIAGNOSIS — M4310 Spondylolisthesis, site unspecified: Secondary | ICD-10-CM | POA: Diagnosis not present

## 2014-08-21 DIAGNOSIS — E785 Hyperlipidemia, unspecified: Secondary | ICD-10-CM | POA: Diagnosis not present

## 2014-08-21 DIAGNOSIS — M47812 Spondylosis without myelopathy or radiculopathy, cervical region: Secondary | ICD-10-CM | POA: Diagnosis not present

## 2014-08-21 DIAGNOSIS — G894 Chronic pain syndrome: Secondary | ICD-10-CM | POA: Diagnosis not present

## 2014-08-22 ENCOUNTER — Ambulatory Visit (AMBULATORY_SURGERY_CENTER): Payer: Medicare Other | Admitting: Internal Medicine

## 2014-08-22 ENCOUNTER — Encounter: Payer: Self-pay | Admitting: Internal Medicine

## 2014-08-22 VITALS — BP 183/84 | HR 73 | Temp 99.7°F | Resp 15 | Ht 62.0 in | Wt 146.0 lb

## 2014-08-22 DIAGNOSIS — E079 Disorder of thyroid, unspecified: Secondary | ICD-10-CM | POA: Diagnosis not present

## 2014-08-22 DIAGNOSIS — D122 Benign neoplasm of ascending colon: Secondary | ICD-10-CM

## 2014-08-22 DIAGNOSIS — Z1211 Encounter for screening for malignant neoplasm of colon: Secondary | ICD-10-CM | POA: Diagnosis not present

## 2014-08-22 DIAGNOSIS — K219 Gastro-esophageal reflux disease without esophagitis: Secondary | ICD-10-CM | POA: Diagnosis not present

## 2014-08-22 DIAGNOSIS — R1310 Dysphagia, unspecified: Secondary | ICD-10-CM | POA: Diagnosis not present

## 2014-08-22 DIAGNOSIS — D123 Benign neoplasm of transverse colon: Secondary | ICD-10-CM | POA: Diagnosis not present

## 2014-08-22 DIAGNOSIS — R1314 Dysphagia, pharyngoesophageal phase: Secondary | ICD-10-CM | POA: Diagnosis not present

## 2014-08-22 DIAGNOSIS — E039 Hypothyroidism, unspecified: Secondary | ICD-10-CM | POA: Diagnosis not present

## 2014-08-22 DIAGNOSIS — D120 Benign neoplasm of cecum: Secondary | ICD-10-CM

## 2014-08-22 DIAGNOSIS — E785 Hyperlipidemia, unspecified: Secondary | ICD-10-CM | POA: Diagnosis not present

## 2014-08-22 MED ORDER — SODIUM CHLORIDE 0.9 % IV SOLN: 500.0000 mL | INTRAVENOUS | Status: DC

## 2014-08-22 NOTE — Patient Instructions (Addendum)
YOU HAD AN ENDOSCOPIC PROCEDURE TODAY AT [facility]: Refer to the procedure report that was given to you for any specific questions about what was found during the examination.  If the procedure report does not answer your questions, please call your gastroenterologist to clarify.  If you requested that your care partner not be given the details of your procedure findings, then the procedure report has been included in a sealed envelope for you to review at your convenience later.  YOU SHOULD EXPECT: Some feelings of bloating in the abdomen. Passage of more gas than usual.  Walking can help get rid of the air that was put into your GI tract during the procedure and reduce the bloating. If you had a lower endoscopy (such as a colonoscopy or flexible sigmoidoscopy) you may notice spotting of blood in your stool or on the toilet paper. If you underwent a bowel prep for your procedure, then you may not have a normal bowel movement for a few days.  DIET: See dilation diet handout-  Drink plenty of fluids but you should avoid alcoholic beverages for 24 hours.  ACTIVITY: Your care partner should take you home directly after the procedure.  You should plan to take it easy, moving slowly for the rest of the day.  You can resume normal activity the day after the procedure however you should NOT DRIVE or use heavy machinery for 24 hours (because of the sedation medicines used during the test).    SYMPTOMS TO REPORT IMMEDIATELY: A gastroenterologist can be reached at any hour.  During normal business hours, 8:30 AM to 5:00 PM Monday through Friday, call [phone].  After hours and on weekends, please call the GI answering service at [phone] who will take a message and have the physician on call contact you.   Following lower endoscopy (colonoscopy or flexible sigmoidoscopy):  Excessive amounts of blood in the stool  Significant tenderness or worsening of abdominal pains  Swelling of  the abdomen that is new, acute  Fever of 100F or higher  Following upper endoscopy (EGD)  Vomiting of blood or coffee ground material  New chest pain or pain under the shoulder blades  Painful or persistently difficult swallowing  New shortness of breath  Fever of 100F or higher  Black, tarry-looking stools  FOLLOW UP: If any biopsies were taken you will be contacted by phone or by letter within the next 1-3 weeks.  Call your gastroenterologist if you have not heard about the biopsies in 3 weeks.  Our staff will call the home number listed on your records the next business day following your procedure to check on you and address any questions or concerns that you may have at that time regarding the information given to you following your procedure. This is a courtesy call and so if there is no answer at the home number and we have not heard from you through the emergency physician on call, we will assume that you have returned to your regular daily activities without incident.  SIGNATURES/CONFIDENTIALITY: You and/or your care partner have signed paperwork which will be entered into your electronic medical record.  These signatures attest to the fact that that the information above on your After Visit Summary has been reviewed and is understood.  Full responsibility of the confidentiality of this discharge information lies with you and/or your care-partner.  Polyps, dilation diet, stricture-handouts given  Follow-up as needed.  Repeat colonoscopy in 3 years-2018.

## 2014-08-22 NOTE — Op Note (Signed)
[place]  [address]. [place], [id number]   ENDOSCOPY PROCEDURE REPORT  PATIENT: April, Berg  MR#: [id number] BIRTHDATE: 05/27/1946 , 46  yrs. old GENDER: female ENDOSCOPIST: Eustace Quail, MD REFERRED BY:  Janalyn Rouse, M.D. PROCEDURE DATE:  08/22/2014 PROCEDURE:  EGD, diagnostic ASA CLASS:     Class II INDICATIONS:  dysphagia. MEDICATIONS: Monitored anesthesia care and Propofol 100 mg IV TOPICAL ANESTHETIC: none  DESCRIPTION OF PROCEDURE: After the risks benefits and alternatives of the procedure were thoroughly explained, informed consent was obtained.  The LB [id number] endoscope was introduced through the mouth and advanced to the second portion of the duodenum , Without limitations.  The instrument was slowly withdrawn as the mucosa was fully examined.  Exam: The esophagus and gastroesophageal junction were completely normal in appearance.  The stomach was entered and closely examined.The antrum, angularis, and lesser curvature were well visualized, including a retroflexed view of the cardia and fundus. The stomach wall was normally distensable.  The scope passed easily through the pylorus into the duodenum.  Retroflexed views revealed no abnormalities.     The scope was then withdrawn from the patient and the procedure completed. THERAPY: A 54 French Maloney dilator was passed into the esophagus without resistance or heme. Tolerated well  COMPLICATIONS: There were no immediate complications.  ENDOSCOPIC IMPRESSION: 1. Normal EGD 2. Status post empiric dilation of the esophagus per chance the patient has subtle ring or stricture  RECOMMENDATIONS: 1.  Clear liquids until 4 PM, then soft foods rest of day.  Resume prior diet tomorrow. 2.  Continue current medications 3. GI Followup as needed. Resume general medical care with Dr. Eliezer Lofts EXAM:  eSigned:  Eustace Quail, MD 08/22/2014 2:44 PM    CC:W.  Lutricia Feil,  MD and The Patient       PATIENT NAME:  April, Berg MR#: [id number]

## 2014-08-22 NOTE — Progress Notes (Signed)
Called to room to assist during endoscopic procedure.  Patient ID and intended procedure confirmed with present staff. Received instructions for my participation in the procedure from the performing physician.  

## 2014-08-22 NOTE — Progress Notes (Signed)
Patient awakening,vss,report to rn 

## 2014-08-22 NOTE — Op Note (Signed)
[place]  [address]. [place], [id number]   COLONOSCOPY PROCEDURE REPORT  PATIENT: April, Berg  MR#: [id number] BIRTHDATE: 07-14-1946 , 15  yrs. old GENDER: female ENDOSCOPIST: Eustace Quail, MD REFERRED BY:W.  Lutricia Feil, M.D. PROCEDURE DATE:  08/22/2014 PROCEDURE:   Colonoscopy with snare polypectomy x 8 First Screening Colonoscopy - Avg.  risk and is 50 yrs.  old or older - No.  Prior Negative Screening - Now for repeat screening. 10 or more years since last screening  History of Adenoma - Now for follow-up colonoscopy & has been > or = to 3 yrs.  N/A  Polyps Removed Today? Yes. ASA CLASS:   Class II INDICATIONS:average risk for colorectal cancer.  Prior colon 10 years ago elswhere (-) MEDICATIONS: Monitored anesthesia care and Propofol 400 mg IV  DESCRIPTION OF PROCEDURE:   After the risks benefits and alternatives of the procedure were thoroughly explained, informed consent was obtained.  The digital rectal exam revealed no abnormalities of the rectum.   The LB [id number]  endoscope was introduced through the anus and advanced to the cecum, which was identified by both the appendix and ileocecal valve. No adverse events experienced.   The quality of the prep was excellent, using MoviPrep  The instrument was then slowly withdrawn as the colon was fully examined.  COLON FINDINGS: Eight polyps ranging between 3-66mm in size were found in the transverse colon (4), ascending colon (2), and  cecum (2).  A polypectomy was performed with a cold snare.  The resection was complete, the polyp tissue was completely retrieved and sent to histology.   The examination was otherwise normal.  Retroflexed views revealed internal hemorrhoids. The time to cecum=3 minutes 33 seconds.  Withdrawal time=23 minutes 13 seconds.  The scope was withdrawn and the procedure completed. COMPLICATIONS: There were no immediate complications.  ENDOSCOPIC  IMPRESSION: 1.   Eight polyps were found in the colon; polypectomy was performed with a cold snare 2.   The examination was otherwise normal  RECOMMENDATIONS: 1. Repeat Colonoscopy in 3 years. 2. EGD today (see report)  eSigned:  Eustace Quail, MD 08/22/2014 2:30 PM   cc: Janalyn Rouse, MD and The Patient

## 2014-08-23 ENCOUNTER — Telehealth: Payer: Self-pay | Admitting: *Deleted

## 2014-08-23 NOTE — Telephone Encounter (Signed)
  Follow up Call-  Call back number 08/22/2014  Post procedure Call Back phone  # [phone]  Permission to leave phone message Yes     Patient questions:  Do you have a fever, pain , or abdominal swelling? No. Pain Score  0 *  Have you tolerated food without any problems? Yes.    Have you been able to return to your normal activities? Yes.    Do you have any questions about your discharge instructions: Diet   No. Medications  No. Follow up visit  No.  Do you have questions or concerns about your Care? No.  Actions: * If pain score is 4 or above: No action needed, pain <4.

## 2014-08-27 ENCOUNTER — Other Ambulatory Visit: Payer: Self-pay

## 2014-08-27 DIAGNOSIS — Z1231 Encounter for screening mammogram for malignant neoplasm of breast: Secondary | ICD-10-CM

## 2014-08-27 DIAGNOSIS — Z1239 Encounter for other screening for malignant neoplasm of breast: Secondary | ICD-10-CM

## 2014-08-28 ENCOUNTER — Encounter: Payer: Self-pay | Admitting: Internal Medicine

## 2014-09-27 ENCOUNTER — Ambulatory Visit: Payer: Medicare Other

## 2014-09-30 ENCOUNTER — Emergency Department (HOSPITAL_COMMUNITY)
Admission: EM | Admit: 2014-09-30 | Discharge: 2014-09-30 | Disposition: A | Discharge status: Home / Self Care | Payer: Medicare Other | Attending: Emergency Medicine | Admitting: Emergency Medicine

## 2014-09-30 ENCOUNTER — Emergency Department (HOSPITAL_COMMUNITY): Payer: Medicare Other

## 2014-09-30 ENCOUNTER — Encounter (HOSPITAL_COMMUNITY): Payer: Self-pay | Admitting: Emergency Medicine

## 2014-09-30 DIAGNOSIS — E559 Vitamin D deficiency, unspecified: Secondary | ICD-10-CM | POA: Insufficient documentation

## 2014-09-30 DIAGNOSIS — Y9389 Activity, other specified: Secondary | ICD-10-CM | POA: Insufficient documentation

## 2014-09-30 DIAGNOSIS — F329 Major depressive disorder, single episode, unspecified: Secondary | ICD-10-CM | POA: Diagnosis not present

## 2014-09-30 DIAGNOSIS — S4992XA Unspecified injury of left shoulder and upper arm, initial encounter: Secondary | ICD-10-CM | POA: Diagnosis not present

## 2014-09-30 DIAGNOSIS — E039 Hypothyroidism, unspecified: Secondary | ICD-10-CM | POA: Diagnosis not present

## 2014-09-30 DIAGNOSIS — Z9889 Other specified postprocedural states: Secondary | ICD-10-CM | POA: Insufficient documentation

## 2014-09-30 DIAGNOSIS — Y92009 Unspecified place in unspecified non-institutional (private) residence as the place of occurrence of the external cause: Secondary | ICD-10-CM

## 2014-09-30 DIAGNOSIS — Z79899 Other long term (current) drug therapy: Secondary | ICD-10-CM | POA: Insufficient documentation

## 2014-09-30 DIAGNOSIS — Y92012 Bathroom of single-family (private) house as the place of occurrence of the external cause: Secondary | ICD-10-CM | POA: Diagnosis not present

## 2014-09-30 DIAGNOSIS — S59902A Unspecified injury of left elbow, initial encounter: Secondary | ICD-10-CM | POA: Diagnosis not present

## 2014-09-30 DIAGNOSIS — S8992XA Unspecified injury of left lower leg, initial encounter: Secondary | ICD-10-CM | POA: Diagnosis not present

## 2014-09-30 DIAGNOSIS — Y998 Other external cause status: Secondary | ICD-10-CM | POA: Insufficient documentation

## 2014-09-30 DIAGNOSIS — S46912A Strain of unspecified muscle, fascia and tendon at shoulder and upper arm level, left arm, initial encounter: Secondary | ICD-10-CM

## 2014-09-30 DIAGNOSIS — W1830XA Fall on same level, unspecified, initial encounter: Secondary | ICD-10-CM | POA: Diagnosis not present

## 2014-09-30 DIAGNOSIS — M1990 Unspecified osteoarthritis, unspecified site: Secondary | ICD-10-CM | POA: Insufficient documentation

## 2014-09-30 DIAGNOSIS — Z7951 Long term (current) use of inhaled steroids: Secondary | ICD-10-CM | POA: Insufficient documentation

## 2014-09-30 DIAGNOSIS — K219 Gastro-esophageal reflux disease without esophagitis: Secondary | ICD-10-CM | POA: Diagnosis not present

## 2014-09-30 DIAGNOSIS — W19XXXA Unspecified fall, initial encounter: Secondary | ICD-10-CM

## 2014-09-30 DIAGNOSIS — G43909 Migraine, unspecified, not intractable, without status migrainosus: Secondary | ICD-10-CM | POA: Diagnosis not present

## 2014-09-30 DIAGNOSIS — S51012A Laceration without foreign body of left elbow, initial encounter: Secondary | ICD-10-CM | POA: Diagnosis not present

## 2014-09-30 DIAGNOSIS — S46812A Strain of other muscles, fascia and tendons at shoulder and upper arm level, left arm, initial encounter: Secondary | ICD-10-CM | POA: Diagnosis not present

## 2014-09-30 NOTE — ED Provider Notes (Signed)
CSN: [id number]     Arrival date & time 09/30/14  0802 History   First MD Initiated Contact with Patient 09/30/14 [phone]     Chief Complaint  Patient presents with  . Fall  . Arm Injury      HPI Comments: April Berg presents for evaluation of injuries following a fall. She was in the bathroom when she fell onto her left arm and knee at about 6 AM. She denies head injury or loss of consciousness. Her left arm extended out as she fell and she reports pain in her left elbow, left upper arm, and left knee.  She sustained an abrasion to her left knee with which was cleansed and bandaged at home.  She reports mild pain in the left knee and no difficulty with ambulation. The left elbow and arm are giving her moderate pain which is worse with range of motion of the left upper extremity.  Pain is nonradiating. She is right-handed and denies previous injuries with the left arm. She denies neck pain or back pain. No new numbness or weakness.  Patient is a 68 y.o. female presenting with fall and arm injury. The history is provided by the patient and the spouse.  Fall This is a new problem. The current episode started 1 to 2 hours ago. Pertinent negatives include no chest pain.  Arm Injury   Past Medical History  Diagnosis Date  . Complication of anesthesia 90's    block for elbow surgery and pt had a seizure... surgery since with no problem   . Hypothyroidism   . GERD (gastroesophageal reflux disease)   . Arthritis     osteoarthritis  . Spinal stenosis   . History of pericarditis     DEC 2008 AND SMALL PERICARDIAL EFFUSION--   RESOLVED  . Migraines   . Pelvic pain   . Vitamin D deficiency   . H/O hiatal hernia   . IBS (irritable bowel syndrome)   . Glaucoma of both eyes   . Hyperlipidemia   . Depression    Past Surgical History  Procedure Laterality Date  . Anterior cervical decomp/discectomy fusion  12/08/2011    Procedure: ANTERIOR CERVICAL DECOMPRESSION/DISCECTOMY FUSION 3 LEVELS;   Surgeon: Sinclair Ship, MD;  Location: [facility];  Service: Orthopedics;  Laterality: N/A;  C 3-6 ACDF  . Cardiac catheterization  10-27-2007  DR BRUCE BRODIE    NORMAL CORONARY ARTERIES/ NORMAL LVF  . Transthoracic echocardiogram  03-04-2010    MODERATE LVH/ NORMAL LVSF/ MILD DIASTOLIC DYSFUNCTION/ EF 90%/ MILD AI  . Cardiovascular stress test  02-25-2010  DR CRENSHAW    ANTERIOR ATTENUATION NO SCAR OR ISCHEMIA/ EF 81%  . Tonsillectomy  1951  . Cesarean section  1975  . Elbow tendon release Left 1996  . Thumb tendon transposition Right 2012  . Glaucoma surgery Bilateral 2004  . Cataract extraction w/ intraocular lens  implant, bilateral    . Rhinoplasty  2010  . Total abdominal hysterectomy w/ bilateral salpingoophorectomy  1982  . Cysto with hydrodistension N/A 06/14/2013    Procedure: CYSTOSCOPY/HYDRODISTENSION/INSTILLATION OF MARCAINE AND PYRIDIUM;  Surgeon: Reece Packer, MD;  Location: [facility];  Service: Urology;  Laterality: N/A;   Family History  Problem Relation Age of Onset  . Pulmonary fibrosis Mother   . Lung disease Father    History  Substance Use Topics  . Smoking status: Never Smoker   . Smokeless tobacco: Never Used  . Alcohol Use: 1.2 oz/week  2 Glasses of wine per week   OB History    No data available     Review of Systems  Cardiovascular: Negative for chest pain.  All other systems reviewed and are negative.     Allergies  Adhesive; Aspirin; Avelox; Neomycin; Nsaids; and Sulfa antibiotics  Home Medications   Prior to Admission medications   Medication Sig Start Date End Date Taking? Authorizing Provider  Calcium Carbonate (CALCIUM 600 PO) Take 1,200 mg by mouth daily.    Historical Provider, MD  cyclobenzaprine (FLEXERIL) 10 MG tablet Take 10 mg by mouth 3 (three) times daily as needed for muscle spasms.    Historical Provider, MD  dexlansoprazole (DEXILANT) 60 MG capsule Take 60 mg by mouth daily.    Historical  Provider, MD  diazepam (VALIUM) 5 MG tablet Take 5 mg by mouth every 6 (six) hours as needed. For anxiety    Historical Provider, MD  DULoxetine (CYMBALTA) 30 MG capsule Take 30 mg by mouth daily.    Historical Provider, MD  fexofenadine (ALLEGRA) 180 MG tablet Take 180 mg by mouth daily.    Historical Provider, MD  fluticasone (FLONASE) 50 MCG/ACT nasal spray Place into both nostrils daily.    Historical Provider, MD  levothyroxine (SYNTHROID, LEVOTHROID) 112 MCG tablet Take 112 mcg by mouth daily before breakfast.    Historical Provider, MD  Multiple Vitamins-Minerals (MULTIVITAMINS THER. W/MINERALS) TABS Take 1 tablet by mouth daily.    Historical Provider, MD  oxyCODONE-acetaminophen (PERCOCET) 10-325 MG per tablet  08/01/14   Historical Provider, MD  ranitidine (ZANTAC) 150 MG tablet  05/02/14   Historical Provider, MD  timolol (TIMOPTIC) 0.5 % ophthalmic solution  07/05/14   Historical Provider, MD  topiramate (TOPAMAX) 100 MG tablet Take 100 mg by mouth every evening.    Historical Provider, MD  VITAMIN D, CHOLECALCIFEROL, PO Take 2,000 Units by mouth daily.    Historical Provider, MD  Vitamin D, Ergocalciferol, (DRISDOL) 50000 UNITS CAPS Take 50,000 Units by mouth every 7 (seven) days.    Historical Provider, MD   BP 158/74 mmHg  Pulse 84  Temp(Src) 97.6 F (36.4 C) (Oral)  Resp 16  SpO2 100% Physical Exam  Constitutional: She is oriented to person, place, and time. She appears well-developed and well-nourished.  No acute distress  HENT:  Head: Normocephalic and atraumatic.  Neck:  No C, T, or L-spine tenderness.  Cardiovascular: Normal rate and regular rhythm.   No murmur heard. Pulmonary/Chest: Effort normal and breath sounds normal. No respiratory distress. She exhibits no tenderness.  Abdominal: Soft.  Musculoskeletal:  Left knee with small abrasion, no joint effusion, no appreciable tenderness.  Left forearm with approximately 2-3 cm skin tear that is hemostatic. She has  mild tenderness to palpation over the left posterior elbow. There is moderate tenderness over the left mid humerus. 5 out of 5 grip strength. 2+ radial pulses. Supination pronation without difficulty. There is discomfort with extension of the elbow and abduction at the shoulder. Pain is referred to the mid humerus. Patient can externally rotate LUE against resistance  Neurological: She is alert and oriented to person, place, and time.  Skin: Skin is warm and dry.  Psychiatric: She has a normal mood and affect.  Nursing note and vitals reviewed.   ED Course  Procedures (including critical care time) Labs Review Labs Reviewed - No data to display  Imaging Review Dg Elbow Complete Left  09/30/2014   CLINICAL DATA:  Status post fall this morning with  laceration and skin tear posterior left elbow  EXAM: LEFT ELBOW - COMPLETE 3+ VIEW  COMPARISON:  None.  FINDINGS: There is no evidence of fracture, dislocation, or joint effusion. Soft tissues are unremarkable.  IMPRESSION: No acute fracture or dislocation.   Electronically Signed   By: Abelardo Diesel M.D.   On: 09/30/2014 09:30   Dg Humerus Left  09/30/2014   CLINICAL DATA:  Fall this morning. Laceration/skin tear posterior left elbow.  EXAM: LEFT HUMERUS - 2+ VIEW  COMPARISON:  Elbow 09/30/2014.  FINDINGS: There is no evidence of fracture or other focal bone lesions. Soft tissues are unremarkable.  IMPRESSION: Negative.   Electronically Signed   By: Shon Hale M.D.   On: 09/30/2014 09:30     EKG Interpretation None      Left forearm wounds debrided at bedside. Wound area with normal saline, devitalized tissue was excised.  MDM   Final diagnoses:  Fall at home, initial encounter  Strain of left upper arm, initial encounter    Clinical picture not consistent with acute fracture, or dislocation. Discussed home care and follow-up with Dr. Veverly Fells.    April Reichert, MD 09/30/14 [phone]

## 2014-09-30 NOTE — ED Notes (Addendum)
Pt fell this morning, no LOC or head injury. Pt's husband states he talked to Dr. Veverly Fells and was told to come over to ED for xray. Pt c/o left arm pain from fall, abrasions to left forearm and knee. Pt took 1 percocet 10-325 before arrival.

## 2014-09-30 NOTE — Discharge Instructions (Signed)

## 2014-10-01 DIAGNOSIS — M79622 Pain in left upper arm: Secondary | ICD-10-CM | POA: Diagnosis not present

## 2014-10-03 ENCOUNTER — Ambulatory Visit: Payer: Medicare Other

## 2014-10-10 DIAGNOSIS — M47812 Spondylosis without myelopathy or radiculopathy, cervical region: Secondary | ICD-10-CM | POA: Diagnosis not present

## 2014-10-10 DIAGNOSIS — M25512 Pain in left shoulder: Secondary | ICD-10-CM | POA: Diagnosis not present

## 2014-10-10 DIAGNOSIS — G894 Chronic pain syndrome: Secondary | ICD-10-CM | POA: Diagnosis not present

## 2014-10-10 DIAGNOSIS — Z79891 Long term (current) use of opiate analgesic: Secondary | ICD-10-CM | POA: Diagnosis not present

## 2014-10-30 DIAGNOSIS — M5412 Radiculopathy, cervical region: Secondary | ICD-10-CM | POA: Diagnosis not present

## 2014-11-01 ENCOUNTER — Ambulatory Visit: Payer: Medicare Other

## 2014-11-18 DIAGNOSIS — M791 Myalgia: Secondary | ICD-10-CM | POA: Diagnosis not present

## 2014-11-18 DIAGNOSIS — M4806 Spinal stenosis, lumbar region: Secondary | ICD-10-CM | POA: Diagnosis not present

## 2014-11-18 DIAGNOSIS — M545 Low back pain: Secondary | ICD-10-CM | POA: Diagnosis not present

## 2014-11-19 ENCOUNTER — Ambulatory Visit
Admission: RE | Admit: 2014-11-19 | Discharge: 2014-11-19 | Disposition: A | Discharge status: Home / Self Care | Payer: Medicare Other | Source: Ambulatory Visit

## 2014-11-19 ENCOUNTER — Encounter (INDEPENDENT_AMBULATORY_CARE_PROVIDER_SITE_OTHER): Payer: Self-pay

## 2014-11-19 DIAGNOSIS — Z1231 Encounter for screening mammogram for malignant neoplasm of breast: Secondary | ICD-10-CM | POA: Diagnosis not present

## 2014-11-25 DIAGNOSIS — L570 Actinic keratosis: Secondary | ICD-10-CM | POA: Diagnosis not present

## 2014-11-26 DIAGNOSIS — G43719 Chronic migraine without aura, intractable, without status migrainosus: Secondary | ICD-10-CM | POA: Diagnosis not present

## 2014-11-26 DIAGNOSIS — G43019 Migraine without aura, intractable, without status migrainosus: Secondary | ICD-10-CM | POA: Diagnosis not present

## 2014-11-29 DIAGNOSIS — E039 Hypothyroidism, unspecified: Secondary | ICD-10-CM | POA: Diagnosis not present

## 2014-12-05 DIAGNOSIS — Z79891 Long term (current) use of opiate analgesic: Secondary | ICD-10-CM | POA: Diagnosis not present

## 2014-12-05 DIAGNOSIS — G894 Chronic pain syndrome: Secondary | ICD-10-CM | POA: Diagnosis not present

## 2014-12-05 DIAGNOSIS — M47812 Spondylosis without myelopathy or radiculopathy, cervical region: Secondary | ICD-10-CM | POA: Diagnosis not present

## 2014-12-05 DIAGNOSIS — M25512 Pain in left shoulder: Secondary | ICD-10-CM | POA: Diagnosis not present

## 2014-12-10 DIAGNOSIS — M859 Disorder of bone density and structure, unspecified: Secondary | ICD-10-CM | POA: Diagnosis not present

## 2014-12-23 DIAGNOSIS — H8102 Meniere's disease, left ear: Secondary | ICD-10-CM | POA: Diagnosis not present

## 2014-12-23 DIAGNOSIS — R29818 Other symptoms and signs involving the nervous system: Secondary | ICD-10-CM | POA: Diagnosis not present

## 2014-12-23 DIAGNOSIS — J329 Chronic sinusitis, unspecified: Secondary | ICD-10-CM | POA: Diagnosis not present

## 2014-12-23 DIAGNOSIS — H9071 Mixed conductive and sensorineural hearing loss, unilateral, right ear, with unrestricted hearing on the contralateral side: Secondary | ICD-10-CM | POA: Diagnosis not present

## 2014-12-23 DIAGNOSIS — J309 Allergic rhinitis, unspecified: Secondary | ICD-10-CM | POA: Diagnosis not present

## 2014-12-24 DIAGNOSIS — Z8249 Family history of ischemic heart disease and other diseases of the circulatory system: Secondary | ICD-10-CM | POA: Diagnosis not present

## 2014-12-24 DIAGNOSIS — E039 Hypothyroidism, unspecified: Secondary | ICD-10-CM | POA: Diagnosis not present

## 2014-12-24 DIAGNOSIS — Z836 Family history of other diseases of the respiratory system: Secondary | ICD-10-CM | POA: Diagnosis not present

## 2014-12-24 DIAGNOSIS — Z882 Allergy status to sulfonamides status: Secondary | ICD-10-CM | POA: Diagnosis not present

## 2014-12-24 DIAGNOSIS — Z79899 Other long term (current) drug therapy: Secondary | ICD-10-CM | POA: Diagnosis not present

## 2014-12-24 DIAGNOSIS — K219 Gastro-esophageal reflux disease without esophagitis: Secondary | ICD-10-CM | POA: Diagnosis not present

## 2014-12-24 DIAGNOSIS — J328 Other chronic sinusitis: Secondary | ICD-10-CM | POA: Diagnosis not present

## 2014-12-24 DIAGNOSIS — J329 Chronic sinusitis, unspecified: Secondary | ICD-10-CM | POA: Diagnosis not present

## 2014-12-24 DIAGNOSIS — J309 Allergic rhinitis, unspecified: Secondary | ICD-10-CM | POA: Diagnosis not present

## 2014-12-24 DIAGNOSIS — Z886 Allergy status to analgesic agent status: Secondary | ICD-10-CM | POA: Diagnosis not present

## 2014-12-24 DIAGNOSIS — Z8261 Family history of arthritis: Secondary | ICD-10-CM | POA: Diagnosis not present

## 2014-12-24 DIAGNOSIS — Z90710 Acquired absence of both cervix and uterus: Secondary | ICD-10-CM | POA: Diagnosis not present

## 2014-12-25 DIAGNOSIS — R42 Dizziness and giddiness: Secondary | ICD-10-CM | POA: Diagnosis not present

## 2014-12-25 DIAGNOSIS — M5412 Radiculopathy, cervical region: Secondary | ICD-10-CM | POA: Diagnosis not present

## 2014-12-25 DIAGNOSIS — R200 Anesthesia of skin: Secondary | ICD-10-CM | POA: Diagnosis not present

## 2014-12-25 DIAGNOSIS — M542 Cervicalgia: Secondary | ICD-10-CM | POA: Diagnosis not present

## 2014-12-25 DIAGNOSIS — R030 Elevated blood-pressure reading, without diagnosis of hypertension: Secondary | ICD-10-CM | POA: Diagnosis not present

## 2015-01-15 DIAGNOSIS — Z961 Presence of intraocular lens: Secondary | ICD-10-CM | POA: Diagnosis not present

## 2015-01-15 DIAGNOSIS — H4011X1 Primary open-angle glaucoma, mild stage: Secondary | ICD-10-CM | POA: Diagnosis not present

## 2015-01-15 DIAGNOSIS — H3531 Nonexudative age-related macular degeneration: Secondary | ICD-10-CM | POA: Diagnosis not present

## 2015-01-15 DIAGNOSIS — H35372 Puckering of macula, left eye: Secondary | ICD-10-CM | POA: Diagnosis not present

## 2015-01-20 DIAGNOSIS — M5030 Other cervical disc degeneration, unspecified cervical region: Secondary | ICD-10-CM | POA: Diagnosis not present

## 2015-01-20 DIAGNOSIS — M5412 Radiculopathy, cervical region: Secondary | ICD-10-CM | POA: Diagnosis not present

## 2015-01-27 DIAGNOSIS — M542 Cervicalgia: Secondary | ICD-10-CM | POA: Diagnosis not present

## 2015-01-29 ENCOUNTER — Encounter (HOSPITAL_COMMUNITY): Payer: Self-pay

## 2015-01-29 ENCOUNTER — Observation Stay (HOSPITAL_COMMUNITY)
Admission: EM | Admit: 2015-01-29 | Discharge: 2015-01-30 | Disposition: A | Discharge status: Home / Self Care | Payer: Medicare Other | Attending: Internal Medicine | Admitting: Internal Medicine

## 2015-01-29 DIAGNOSIS — R404 Transient alteration of awareness: Secondary | ICD-10-CM | POA: Diagnosis not present

## 2015-01-29 DIAGNOSIS — R531 Weakness: Secondary | ICD-10-CM | POA: Diagnosis not present

## 2015-01-29 DIAGNOSIS — R42 Dizziness and giddiness: Secondary | ICD-10-CM

## 2015-01-29 DIAGNOSIS — H409 Unspecified glaucoma: Secondary | ICD-10-CM | POA: Diagnosis not present

## 2015-01-29 DIAGNOSIS — E039 Hypothyroidism, unspecified: Secondary | ICD-10-CM | POA: Diagnosis not present

## 2015-01-29 DIAGNOSIS — G43909 Migraine, unspecified, not intractable, without status migrainosus: Secondary | ICD-10-CM | POA: Diagnosis not present

## 2015-01-29 DIAGNOSIS — E559 Vitamin D deficiency, unspecified: Secondary | ICD-10-CM | POA: Insufficient documentation

## 2015-01-29 DIAGNOSIS — Z79891 Long term (current) use of opiate analgesic: Secondary | ICD-10-CM | POA: Insufficient documentation

## 2015-01-29 DIAGNOSIS — E785 Hyperlipidemia, unspecified: Secondary | ICD-10-CM | POA: Diagnosis not present

## 2015-01-29 DIAGNOSIS — F329 Major depressive disorder, single episode, unspecified: Secondary | ICD-10-CM | POA: Diagnosis present

## 2015-01-29 DIAGNOSIS — M47812 Spondylosis without myelopathy or radiculopathy, cervical region: Secondary | ICD-10-CM | POA: Diagnosis present

## 2015-01-29 DIAGNOSIS — M1990 Unspecified osteoarthritis, unspecified site: Secondary | ICD-10-CM | POA: Insufficient documentation

## 2015-01-29 DIAGNOSIS — G8929 Other chronic pain: Secondary | ICD-10-CM | POA: Insufficient documentation

## 2015-01-29 DIAGNOSIS — Z79899 Other long term (current) drug therapy: Secondary | ICD-10-CM | POA: Diagnosis not present

## 2015-01-29 DIAGNOSIS — K219 Gastro-esophageal reflux disease without esophagitis: Secondary | ICD-10-CM | POA: Diagnosis not present

## 2015-01-29 DIAGNOSIS — M5412 Radiculopathy, cervical region: Secondary | ICD-10-CM

## 2015-01-29 DIAGNOSIS — M542 Cervicalgia: Secondary | ICD-10-CM | POA: Diagnosis present

## 2015-01-29 DIAGNOSIS — R112 Nausea with vomiting, unspecified: Secondary | ICD-10-CM | POA: Diagnosis present

## 2015-01-29 MED ORDER — LORAZEPAM 1 MG PO TABS
1.0000 mg | ORAL_TABLET | Freq: Once | ORAL | Status: AC
  Administered 2015-01-29: 1 mg via ORAL
  Filled 2015-01-29: qty 1

## 2015-01-29 MED ORDER — MECLIZINE HCL 25 MG PO TABS
25.0000 mg | ORAL_TABLET | Freq: Once | ORAL | Status: AC
  Administered 2015-01-29: 25 mg via ORAL
  Filled 2015-01-29: qty 1

## 2015-01-29 NOTE — ED Notes (Signed)
Patient arrived via EMS following sudden onset of nausea, vomiting and dizziness.  No neurological deficits.

## 2015-01-29 NOTE — ED Notes (Signed)
Bed: WA01 Expected date:  Expected time:  Means of arrival:  Comments: EMS vomiting and dizzy

## 2015-01-29 NOTE — ED Notes (Signed)
[place], [place], made aware of patient becoming dizzy while ambulating.

## 2015-01-29 NOTE — ED Notes (Signed)
[place], [place], at bedside.

## 2015-01-29 NOTE — ED Provider Notes (Signed)
CSN: [id number]     Arrival date & time 01/29/15  2019 History   First MD Initiated Contact with Patient 01/29/15 2024     Chief Complaint  Patient presents with  . Emesis     (Consider location/radiation/quality/duration/timing/severity/associated sxs/prior Treatment) HPI April Berg is a 69 y.o. female with a history of vertigo, hypothyroidism comes in for evaluation of acute dizziness. Patient is accompanied by her husband and daughter who contributed history of present illness. Patient states that 7:00 PM this evening she sat down in her chair and looked at her eye pad and experienced a sudden onset episode of vertigo much worse than any she has experienced before. She reports the sensation as "being on a very fast merry-go-round". She reports associated nausea with 6 episodes of nonbloody nonbilious emesis. Her husband was present throughout the entire event, is a retired Engineer, drilling, and reports patient never had any neurological deficits, no slurred speech, syncope, focal weaknesses. Patient denies any headache, vision changes, weakness. She reports taking Zofran from EMS which slightly improved her symptoms. She reports raising her head exacerbates her symptoms and reproduces the spinning sensation. Denies fevers, chills, chest pain, shortness of breath, abdominal pain, syncope  Of note, patient also reports a sudden cessation of Cymbalta 5 days ago and starting Lexapro.   Past Medical History  Diagnosis Date  . Complication of anesthesia 90's    block for elbow surgery and pt had a seizure... surgery since with no problem   . Hypothyroidism   . GERD (gastroesophageal reflux disease)   . Arthritis     osteoarthritis  . Spinal stenosis   . History of pericarditis     DEC 2008 AND SMALL PERICARDIAL EFFUSION--   RESOLVED  . Migraines   . Pelvic pain   . Vitamin D deficiency   . H/O hiatal hernia   . IBS (irritable bowel syndrome)   . Glaucoma of both eyes   . Hyperlipidemia   .  Depression    Past Surgical History  Procedure Laterality Date  . Anterior cervical decomp/discectomy fusion  12/08/2011    Procedure: ANTERIOR CERVICAL DECOMPRESSION/DISCECTOMY FUSION 3 LEVELS;  Surgeon: Sinclair Ship, MD;  Location: [facility];  Service: Orthopedics;  Laterality: N/A;  C 3-6 ACDF  . Cardiac catheterization  10-27-2007  DR BRUCE BRODIE    NORMAL CORONARY ARTERIES/ NORMAL LVF  . Transthoracic echocardiogram  03-04-2010    MODERATE LVH/ NORMAL LVSF/ MILD DIASTOLIC DYSFUNCTION/ EF 11%/ MILD AI  . Cardiovascular stress test  02-25-2010  DR CRENSHAW    ANTERIOR ATTENUATION NO SCAR OR ISCHEMIA/ EF 81%  . Tonsillectomy  1951  . Cesarean section  1975  . Elbow tendon release Left 1996  . Thumb tendon transposition Right 2012  . Glaucoma surgery Bilateral 2004  . Cataract extraction w/ intraocular lens  implant, bilateral    . Rhinoplasty  2010  . Total abdominal hysterectomy w/ bilateral salpingoophorectomy  1982  . Cysto with hydrodistension N/A 06/14/2013    Procedure: CYSTOSCOPY/HYDRODISTENSION/INSTILLATION OF MARCAINE AND PYRIDIUM;  Surgeon: Reece Packer, MD;  Location: [facility];  Service: Urology;  Laterality: N/A;   Family History  Problem Relation Age of Onset  . Pulmonary fibrosis Mother   . Lung disease Father    History  Substance Use Topics  . Smoking status: Never Smoker   . Smokeless tobacco: Never Used  . Alcohol Use: 1.2 oz/week    2 Glasses of wine per week   OB History  No data available     Review of Systems A 10 point review of systems was completed and was negative except for pertinent positives and negatives as mentioned in the history of present illness     Allergies  Adhesive; Aspirin; Avelox; Neomycin; Nsaids; and Sulfa antibiotics  Home Medications   Prior to Admission medications   Medication Sig Start Date End Date Taking? Authorizing Provider  Calcium Carbonate (CALCIUM 600 PO) Take 1,200 mg by mouth  daily.   Yes Historical Provider, MD  cyclobenzaprine (FLEXERIL) 10 MG tablet Take 10 mg by mouth 3 (three) times daily as needed for muscle spasms (muscle spasms).    Yes Historical Provider, MD  dexlansoprazole (DEXILANT) 60 MG capsule Take 60 mg by mouth daily.   Yes Historical Provider, MD  fexofenadine (ALLEGRA) 180 MG tablet Take 180 mg by mouth daily.   Yes Historical Provider, MD  fluticasone (FLONASE) 50 MCG/ACT nasal spray Place 1 spray into both nostrils daily.    Yes Historical Provider, MD  levothyroxine (SYNTHROID, LEVOTHROID) 112 MCG tablet Take 112 mcg by mouth daily before breakfast.   Yes Historical Provider, MD  Multiple Vitamins-Minerals (MULTIVITAMINS THER. W/MINERALS) TABS Take 1 tablet by mouth daily.   Yes Historical Provider, MD  oxyCODONE-acetaminophen (PERCOCET) 10-325 MG per tablet Take 1 tablet by mouth every 6 (six) hours as needed for pain (pain).  08/01/14  Yes Historical Provider, MD  ranitidine (ZANTAC) 150 MG tablet Take 150 mg by mouth at bedtime.  05/02/14  Yes Historical Provider, MD  timolol (TIMOPTIC) 0.5 % ophthalmic solution Place 1 drop into both eyes at bedtime.  07/05/14  Yes Historical Provider, MD  topiramate (TOPAMAX) 100 MG tablet Take 100 mg by mouth every evening.   Yes Historical Provider, MD  VITAMIN D, CHOLECALCIFEROL, PO Take 2,000 Units by mouth daily.   Yes Historical Provider, MD  Vitamin D, Ergocalciferol, (DRISDOL) 50000 UNITS CAPS Take 50,000 Units by mouth every 7 (seven) days. On Wednesdays.   Yes Historical Provider, MD  DULoxetine (CYMBALTA) 30 MG capsule Take 30 mg by mouth daily.    Historical Provider, MD   BP 151/75 mmHg  Pulse 75  Temp(Src) 97.6 F (36.4 C) (Oral)  Resp 16  Ht 5\' 2"  (1.575 m)  Wt 148 lb (67.132 kg)  BMI 27.06 kg/m2  SpO2 99% Physical Exam  Constitutional: She is oriented to person, place, and time. She appears well-developed and well-nourished.  HENT:  Head: Normocephalic and atraumatic.  Mouth/Throat:  Oropharynx is clear and moist.  Eyes: Conjunctivae are normal. Pupils are equal, round, and reactive to light. Right eye exhibits no discharge. Left eye exhibits no discharge. No scleral icterus.  Neck: Neck supple.  No carotid bruits. No cervical tenderness. Maintains full active range of motion of cervical spine.  Cardiovascular: Normal rate, regular rhythm and normal heart sounds.   Pulmonary/Chest: Effort normal and breath sounds normal. No respiratory distress. She has no wheezes. She has no rales.  Abdominal: Soft. There is no tenderness.  Musculoskeletal: She exhibits no tenderness.  Neurological: She is alert and oriented to person, place, and time.  Cranial Nerves II-XII grossly intact. Motor and sensation 5/5 in all 4 extremities. Completes finger to nose and heel to shin coordination movements without difficulty. Extraocular movements intact without nystagmus.  Skin: Skin is warm and dry. No rash noted.  Psychiatric: She has a normal mood and affect.  Nursing note and vitals reviewed.   ED Course  Procedures (including critical care time) Labs  Review Labs Reviewed - No data to display  Imaging Review Ct Head Wo Contrast  01/30/2015   CLINICAL DATA:  Nausea and vomiting and dizziness, onset 1900 hr tonight. No trauma.  EXAM: CT HEAD WITHOUT CONTRAST  TECHNIQUE: Contiguous axial images were obtained from the base of the skull through the vertex without intravenous contrast.  COMPARISON:  06/15/2012  FINDINGS: Mild cerebral atrophy. No ventricular dilatation or significant white matter changes. No mass effect or midline shift. No abnormal extra-axial fluid collections. Gray-white matter junctions are distinct. Basal cisterns are not effaced. No evidence of acute intracranial hemorrhage. No depressed skull fractures. Visualized paranasal sinuses and mastoid air cells are not opacified.  IMPRESSION: No acute intracranial abnormalities.   Electronically Signed   By: Lucienne Capers M.D.    On: 01/30/2015 00:27     EKG Interpretation None     Meds given in ED:  Medications  LORazepam (ATIVAN) tablet 1 mg (1 mg Oral Given 01/29/15 2112)  meclizine (ANTIVERT) tablet 25 mg (25 mg Oral Given 01/29/15 2244)    New Prescriptions   No medications on file   Filed Vitals:   01/29/15 2021 01/29/15 2024 01/29/15 2209  BP:  186/94 151/75  Pulse:  77 75  Temp:  97.6 F (36.4 C)   TempSrc:  Oral   Resp:  18 16  Height:  5\' 2"  (1.575 m)   Weight:  148 lb (67.132 kg)   SpO2: 98% 100% 99%    MDM  Vitals stable - WNL -afebrile Pt resting comfortably in ED. Reports her discomfort has improved with Ativan and meclizine, however she is still unable to ambulate independently without dizziness. No vomiting in ED PE--normal neuro exam. Otherwise unremarkable  Imaging-CT head shows no acute intracranial pathology.  Discussed patient presentation and ED course with attending, Dr. Tamera Punt, decision made to consult internal medicine and have patient admitted for further evaluation and management of vertigo.  Consult to internal medicine, [facility]. Dr. Forde Dandy. Patient admitted.    Final diagnoses:  Vertigo  Dizziness       Comer Locket, PA-C 01/30/15 0128  Malvin Johns, MD 02/02/15 1747

## 2015-01-29 NOTE — ED Notes (Signed)
Pt was not able to ambulate.  Pt complained of feeling dizzy.   Pt's O2(97) and heart rate were within normal limits.

## 2015-01-29 NOTE — ED Notes (Signed)
Patient reports feeling lousy.  Reports she was feeling normal today and had sudden onset of vomiting.  Reports 6 episodes of emesis.  Husband at bedside reports that patient recently switched from Cymbalta to Lexapro 5 days PTA.

## 2015-01-30 ENCOUNTER — Encounter (HOSPITAL_COMMUNITY): Payer: Self-pay

## 2015-01-30 ENCOUNTER — Observation Stay (HOSPITAL_COMMUNITY): Payer: Medicare Other

## 2015-01-30 ENCOUNTER — Emergency Department (HOSPITAL_COMMUNITY): Payer: Medicare Other

## 2015-01-30 DIAGNOSIS — R42 Dizziness and giddiness: Secondary | ICD-10-CM | POA: Diagnosis not present

## 2015-01-30 DIAGNOSIS — M542 Cervicalgia: Secondary | ICD-10-CM | POA: Diagnosis present

## 2015-01-30 DIAGNOSIS — R51 Headache: Secondary | ICD-10-CM | POA: Diagnosis not present

## 2015-01-30 DIAGNOSIS — R112 Nausea with vomiting, unspecified: Secondary | ICD-10-CM | POA: Diagnosis not present

## 2015-01-30 LAB — CBC
HCT: 43.2 % (ref 36.0–46.0)
HEMOGLOBIN: 14.2 g/dL (ref 12.0–15.0)
MCH: 30 pg (ref 26.0–34.0)
MCHC: 32.9 g/dL (ref 30.0–36.0)
MCV: 91.1 fL (ref 78.0–100.0)
PLATELETS: 264 10*3/uL (ref 150–400)
RBC: 4.74 MIL/uL (ref 3.87–5.11)
RDW: 13.1 % (ref 11.5–15.5)
WBC: 6.7 10*3/uL (ref 4.0–10.5)

## 2015-01-30 LAB — BASIC METABOLIC PANEL
Anion gap: 4 — ABNORMAL LOW (ref 5–15)
BUN: 20 mg/dL (ref 6–23)
CALCIUM: 8.8 mg/dL (ref 8.4–10.5)
CO2: 23 mmol/L (ref 19–32)
Chloride: 115 mmol/L — ABNORMAL HIGH (ref 96–112)
Creatinine, Ser: 0.99 mg/dL (ref 0.50–1.10)
GFR calc Af Amer: 67 mL/min — ABNORMAL LOW (ref >90)
GFR calc non Af Amer: 58 mL/min — ABNORMAL LOW (ref >90)
GLUCOSE: 99 mg/dL (ref 70–99)
POTASSIUM: 4.1 mmol/L (ref 3.5–5.1)
Sodium: 142 mmol/L (ref 135–145)

## 2015-01-30 MED ORDER — OXYCODONE-ACETAMINOPHEN 5-325 MG PO TABS: 1.0000 | ORAL_TABLET | Freq: Four times a day (QID) | ORAL | Status: DC | PRN

## 2015-01-30 MED ORDER — LORAZEPAM 2 MG/ML IJ SOLN
2.0000 mg | Freq: Once | INTRAMUSCULAR | Status: AC
  Administered 2015-01-30: 2 mg via INTRAVENOUS

## 2015-01-30 MED ORDER — MECLIZINE HCL 25 MG PO TABS
25.0000 mg | ORAL_TABLET | Freq: Three times a day (TID) | ORAL | Status: DC | PRN
  Filled 2015-01-30: qty 1

## 2015-01-30 MED ORDER — LORATADINE 10 MG PO TABS
10.0000 mg | ORAL_TABLET | Freq: Every day | ORAL | Status: DC
  Filled 2015-01-30: qty 1

## 2015-01-30 MED ORDER — VITAMIN D3 25 MCG (1000 UNIT) PO TABS
2000.0000 [IU] | ORAL_TABLET | Freq: Every day | ORAL | Status: DC
  Filled 2015-01-30: qty 2

## 2015-01-30 MED ORDER — LORAZEPAM 1 MG PO TABS: 1.0000 mg | ORAL_TABLET | Freq: Three times a day (TID) | ORAL | Status: DC | PRN

## 2015-01-30 MED ORDER — GADOBENATE DIMEGLUMINE 529 MG/ML IV SOLN
15.0000 mL | Freq: Once | INTRAVENOUS | Status: AC | PRN
  Administered 2015-01-30: 14 mL via INTRAVENOUS

## 2015-01-30 MED ORDER — PANTOPRAZOLE SODIUM 40 MG PO TBEC: 40.0000 mg | DELAYED_RELEASE_TABLET | Freq: Every day | ORAL | Status: DC

## 2015-01-30 MED ORDER — TIMOLOL MALEATE 0.5 % OP SOLN
1.0000 [drp] | Freq: Every day | OPHTHALMIC | Status: DC
  Filled 2015-01-30: qty 5

## 2015-01-30 MED ORDER — MECLIZINE HCL 25 MG PO TABS: 25.0000 mg | ORAL_TABLET | Freq: Three times a day (TID) | ORAL | Status: DC

## 2015-01-30 MED ORDER — ADULT MULTIVITAMIN W/MINERALS CH
1.0000 | ORAL_TABLET | Freq: Every day | ORAL | Status: DC
  Filled 2015-01-30: qty 1

## 2015-01-30 MED ORDER — FAMOTIDINE 10 MG PO TABS
10.0000 mg | ORAL_TABLET | Freq: Two times a day (BID) | ORAL | Status: DC
  Filled 2015-01-30 (×2): qty 1

## 2015-01-30 MED ORDER — FLUTICASONE PROPIONATE 50 MCG/ACT NA SUSP
1.0000 | Freq: Every day | NASAL | Status: DC
  Filled 2015-01-30: qty 16

## 2015-01-30 MED ORDER — LEVOTHYROXINE SODIUM 112 MCG PO TABS
112.0000 ug | ORAL_TABLET | Freq: Every day | ORAL | Status: DC
  Filled 2015-01-30: qty 1

## 2015-01-30 MED ORDER — ENOXAPARIN SODIUM 40 MG/0.4ML ~~LOC~~ SOLN
40.0000 mg | SUBCUTANEOUS | Status: DC
  Filled 2015-01-30: qty 0.4

## 2015-01-30 MED ORDER — TOPIRAMATE 100 MG PO TABS
100.0000 mg | ORAL_TABLET | Freq: Every evening | ORAL | Status: DC
  Filled 2015-01-30: qty 1

## 2015-01-30 MED ORDER — VITAMIN D (CHOLECALCIFEROL) 25 MCG (1000 UT) PO TABS: 2000.0000 [IU] | ORAL_TABLET | Freq: Every day | ORAL | Status: DC

## 2015-01-30 MED ORDER — SODIUM CHLORIDE 0.9 % IV SOLN
INTRAVENOUS | Status: DC
  Administered 2015-01-30: 03:00:00 via INTRAVENOUS

## 2015-01-30 MED ORDER — SODIUM CHLORIDE 0.9 % IJ SOLN
3.0000 mL | Freq: Two times a day (BID) | INTRAMUSCULAR | Status: DC
  Administered 2015-01-30: 3 mL via INTRAVENOUS

## 2015-01-30 MED ORDER — ONDANSETRON HCL 4 MG PO TABS: 4.0000 mg | ORAL_TABLET | Freq: Three times a day (TID) | ORAL | Status: DC | PRN

## 2015-01-30 MED ORDER — MECLIZINE HCL 25 MG PO TABS
25.0000 mg | ORAL_TABLET | Freq: Three times a day (TID) | ORAL | Status: DC
  Administered 2015-01-30 (×2): 25 mg via ORAL
  Filled 2015-01-30 (×4): qty 1

## 2015-01-30 MED ORDER — OXYCODONE-ACETAMINOPHEN 10-325 MG PO TABS: 1.0000 | ORAL_TABLET | Freq: Four times a day (QID) | ORAL | Status: DC | PRN

## 2015-01-30 MED ORDER — CALCIUM CARBONATE 1250 (500 CA) MG PO TABS
1.0000 | ORAL_TABLET | Freq: Every day | ORAL | Status: DC
  Filled 2015-01-30: qty 1

## 2015-01-30 MED ORDER — OXYCODONE HCL 5 MG PO TABS
5.0000 mg | ORAL_TABLET | Freq: Four times a day (QID) | ORAL | Status: DC | PRN
Start: 2015-01-30 — End: 2015-01-30

## 2015-01-30 MED ORDER — ONDANSETRON HCL 4 MG/2ML IJ SOLN
4.0000 mg | Freq: Four times a day (QID) | INTRAMUSCULAR | Status: DC | PRN
  Administered 2015-01-30 (×2): 4 mg via INTRAVENOUS
  Filled 2015-01-30 (×2): qty 2

## 2015-01-30 MED ORDER — CYCLOBENZAPRINE HCL 10 MG PO TABS: 10.0000 mg | ORAL_TABLET | Freq: Three times a day (TID) | ORAL | Status: DC | PRN

## 2015-01-30 MED ORDER — LORAZEPAM 2 MG/ML IJ SOLN
0.5000 mg | INTRAMUSCULAR | Status: DC | PRN
Start: 2015-01-30 — End: 2015-01-30
  Filled 2015-01-30: qty 1

## 2015-01-30 MED ORDER — THERA M PLUS PO TABS: 1.0000 | ORAL_TABLET | Freq: Every day | ORAL | Status: DC

## 2015-01-30 MED ORDER — CALCIUM CARBONATE 600 MG PO TABS: 1200.0000 mg | ORAL_TABLET | Freq: Every day | ORAL | Status: DC

## 2015-01-30 NOTE — Discharge Instructions (Addendum)
Epley Maneuver Self-Care WHAT IS THE EPLEY MANEUVER? The Epley maneuver is an exercise you can do to relieve symptoms of benign paroxysmal positional vertigo (BPPV). This condition is often just referred to as vertigo. BPPV is caused by the movement of tiny crystals (canaliths) inside your inner ear. The accumulation and movement of canaliths in your inner ear causes a sudden spinning sensation (vertigo) when you move your head to certain positions. Vertigo usually lasts about 30 seconds. BPPV usually occurs in just one ear. If you get vertigo when you lie on your left side, you probably have BPPV in your left ear. Your health care provider can tell you which ear is involved.  BPPV may be caused by a head injury. Many people older than 50 get BPPV for unknown reasons. If you have been diagnosed with BPPV, your health care provider may teach you how to do this maneuver. BPPV is not life threatening (benign) and usually goes away in time.  WHEN SHOULD I PERFORM THE EPLEY MANEUVER? You can do this maneuver at home whenever you have symptoms of vertigo. You may do the Epley maneuver up to 3 times a day until your symptoms of vertigo go away. HOW SHOULD I DO THE EPLEY MANEUVER?  Sit on the edge of a bed or table with your back straight. Your legs should be extended or hanging over the edge of the bed or table.   Turn your head halfway toward the affected ear.   Lie backward quickly with your head turned until you are lying flat on your back. You may want to position a pillow under your shoulders.   Hold this position for 30 seconds. You may experience an attack of vertigo. This is normal. Hold this position until the vertigo stops.  Then turn your head to the opposite direction until your unaffected ear is facing the floor.   Hold this position for 30 seconds. You may experience an attack of vertigo. This is normal. Hold this position until the vertigo stops.  Now turn your whole body to the same  side as your head. Hold for another 30 seconds.   You can then sit back up. ARE THERE RISKS TO THIS MANEUVER? In some cases, you may have other symptoms (such as changes in your vision, weakness, or numbness). If you have these symptoms, stop doing the maneuver and call your health care provider. Even if doing these maneuvers relieves your vertigo, you may still have dizziness. Dizziness is the sensation of light-headedness but without the sensation of movement. Even though the Epley maneuver may relieve your vertigo, it is possible that your symptoms will return within 5 years. WHAT SHOULD I DO AFTER THIS MANEUVER? After doing the Epley maneuver, you can return to your normal activities. Ask your doctor if there is anything you should do at home to prevent vertigo. This may include:  Sleeping with two or more pillows to keep your head elevated.  Not sleeping on the side of your affected ear.  Getting up slowly from bed.  Avoiding sudden movements during the day.  Avoiding extreme head movement, like looking up or bending over.  Wearing a cervical collar to prevent sudden head movements. WHAT SHOULD I DO IF MY SYMPTOMS GET WORSE? Call your health care provider if your vertigo gets worse. Call your provider right way if you have other symptoms, including:   Nausea.  Vomiting.  Headache.  Weakness.  Numbness.  Vision changes. Document Released: 11/13/2013 Document Reviewed: 11/13/2013 ExitCare  Patient Information 2015 [place]. This information is not intended to replace advice given to you by your health care provider. Make sure you discuss any questions you have with your health care provider.  Vertigo Vertigo means you feel like you or your surroundings are moving when they are not. Vertigo can be dangerous if it occurs when you are at work, driving, or performing difficult activities.  CAUSES  Vertigo occurs when there is a conflict of signals sent to your brain from  the visual and sensory systems in your body. There are many different causes of vertigo, including:  Infections, especially in the inner ear.  A bad reaction to a drug or misuse of alcohol and medicines.  Withdrawal from drugs or alcohol.  Rapidly changing positions, such as lying down or rolling over in bed.  A migraine headache.  Decreased blood flow to the brain.  Increased pressure in the brain from a head injury, infection, tumor, or bleeding. SYMPTOMS  You may feel as though the world is spinning around or you are falling to the ground. Because your balance is upset, vertigo can cause nausea and vomiting. You may have involuntary eye movements (nystagmus). DIAGNOSIS  Vertigo is usually diagnosed by physical exam. If the cause of your vertigo is unknown, your caregiver may perform imaging tests, such as an MRI scan (magnetic resonance imaging). TREATMENT  Most cases of vertigo resolve on their own, without treatment. Depending on the cause, your caregiver may prescribe certain medicines. If your vertigo is related to body position issues, your caregiver may recommend movements or procedures to correct the problem. In rare cases, if your vertigo is caused by certain inner ear problems, you may need surgery. HOME CARE INSTRUCTIONS   Follow your caregiver's instructions.  Avoid driving.  Avoid operating heavy machinery.  Avoid performing any tasks that would be dangerous to you or others during a vertigo episode.  Tell your caregiver if you notice that certain medicines seem to be causing your vertigo. Some of the medicines used to treat vertigo episodes can actually make them worse in some people. SEEK IMMEDIATE MEDICAL CARE IF:   Your medicines do not relieve your vertigo or are making it worse.  You develop problems with talking, walking, weakness, or using your arms, hands, or legs.  You develop severe headaches.  Your nausea or vomiting continues or gets worse.  You  develop visual changes.  A family member notices behavioral changes.  Your condition gets worse. MAKE SURE YOU:  Understand these instructions.  Will watch your condition.  Will get help right away if you are not doing well or get worse. Document Released: 08/18/2005 Document Revised: 01/31/2012 Document Reviewed: 05/27/2011 [facility] Patient Information 2015 [place], [place]. This information is not intended to replace advice given to you by your health care provider. Make sure you discuss any questions you have with your health care provider.

## 2015-01-30 NOTE — Progress Notes (Signed)
UR completed 

## 2015-01-30 NOTE — Progress Notes (Signed)
Pt d/c to home. Discharge instructions, follow up appts, prescriptions, reasons to return to MD/ED, and after visit care reviewed with pt. Pt's husband present for discahrge instructions as well. Pt and pt's husband offered no questions. PIV removed without complication. Pt to be escorted off of unit via wheelchair.

## 2015-01-30 NOTE — H&P (Signed)
PCP:   Marton Redwood, MD   Chief Complaint:  Vertigo  HPI: April Berg is a 69 year old white female with a history of Migraine Headaches, hyperlipidemia, and migraine headaches who presented to the emergency department with acute onset of severe vertigo.patient and her husband state that she's been her usual state of health.  She has been dealing with some ongoing neck pain with radiculopathy down the right arm and into the scapula.  She had seen orthopedics at [facility] and locally with recommendations for MRI of the cervical spine to direct additional treatment options.  Other than this, she has been doing remarkably well.  She is been doing some spring cleaning recently involving some leaning over.   Last evening, after crawling through her car to find a coat.  She sat down in her chair where she suddenly developed severe vertigo.  She describes it as feeling like she had been on a twirling ride where the rhythm was swirlng around her.  This is associated with 6 episodes of nausea and vomiting.  Her symptoms persisted propping her to come to the emergency department where head CT was negative. On the emergency department, she received Ativan with minimal improvement.  She was unable to ambulate without severe vertigo and nausea.  She was admitted for further management.  She has a history of minor vertigo in the past but never this severe.  She denies any significant headache, visual field deficits, weakness, numbness, tingling.  She has had chronic ear issues, but none recently.  No hearing loss, tinnitus.  This morning, she feels a little better while lying flat with some mild vertigo symptoms, worse with movement and head turning.  Review of Systems:  Review of Systems - All systems negative except in HPI with the following exceptions- ongoing neck pain radiating to scapula and right shoulder.   Past Medical History: Past Medical History  Diagnosis Date  . Complication of anesthesia 90's    block  for elbow surgery and pt had a seizure... surgery since with no problem   . Hypothyroidism   . GERD (gastroesophageal reflux disease)   . Arthritis     osteoarthritis  . Spinal stenosis   . History of pericarditis     DEC 2008 AND SMALL PERICARDIAL EFFUSION--   RESOLVED  . Migraines   . Pelvic pain   . Vitamin D deficiency   . H/O hiatal hernia   . IBS (irritable bowel syndrome)   . Glaucoma of both eyes   . Hyperlipidemia   . Depression      Past Surgical History  Procedure Laterality Date  . Anterior cervical decomp/discectomy fusion  12/08/2011    Procedure: ANTERIOR CERVICAL DECOMPRESSION/DISCECTOMY FUSION 3 LEVELS;  Surgeon: Sinclair Ship, MD;  Location: [facility];  Service: Orthopedics;  Laterality: N/A;  C 3-6 ACDF  . Cardiac catheterization  10-27-2007  DR BRUCE BRODIE    NORMAL CORONARY ARTERIES/ NORMAL LVF  . Transthoracic echocardiogram  03-04-2010    MODERATE LVH/ NORMAL LVSF/ MILD DIASTOLIC DYSFUNCTION/ EF 23%/ MILD AI  . Cardiovascular stress test  02-25-2010  DR CRENSHAW    ANTERIOR ATTENUATION NO SCAR OR ISCHEMIA/ EF 81%  . Tonsillectomy  1951  . Cesarean section  1975  . Elbow tendon release Left 1996  . Thumb tendon transposition Right 2012  . Glaucoma surgery Bilateral 2004  . Cataract extraction w/ intraocular lens  implant, bilateral    . Rhinoplasty  2010  . Total abdominal hysterectomy w/  bilateral salpingoophorectomy  1982  . Cysto with hydrodistension N/A 06/14/2013    Procedure: CYSTOSCOPY/HYDRODISTENSION/INSTILLATION OF MARCAINE AND PYRIDIUM;  Surgeon: Reece Packer, MD;  Location: [facility];  Service: Urology;  Laterality: N/A;    Medications: Prior to Admission medications   Medication Sig Start Date End Date Taking? Authorizing Provider  Calcium Carbonate (CALCIUM 600 PO) Take 1,200 mg by mouth daily.   Yes Historical Provider, MD  cyclobenzaprine (FLEXERIL) 10 MG tablet Take 10 mg by mouth 3 (three) times daily as  needed for muscle spasms (muscle spasms).    Yes Historical Provider, MD  dexlansoprazole (DEXILANT) 60 MG capsule Take 60 mg by mouth daily.   Yes Historical Provider, MD  fexofenadine (ALLEGRA) 180 MG tablet Take 180 mg by mouth daily.   Yes Historical Provider, MD  fluticasone (FLONASE) 50 MCG/ACT nasal spray Place 1 spray into both nostrils daily.    Yes Historical Provider, MD  levothyroxine (SYNTHROID, LEVOTHROID) 112 MCG tablet Take 112 mcg by mouth daily before breakfast.   Yes Historical Provider, MD  Multiple Vitamins-Minerals (MULTIVITAMINS THER. W/MINERALS) TABS Take 1 tablet by mouth daily.   Yes Historical Provider, MD  oxyCODONE-acetaminophen (PERCOCET) 10-325 MG per tablet Take 1 tablet by mouth every 6 (six) hours as needed for pain (pain).  08/01/14  Yes Historical Provider, MD  ranitidine (ZANTAC) 150 MG tablet Take 150 mg by mouth at bedtime.  05/02/14  Yes Historical Provider, MD  timolol (TIMOPTIC) 0.5 % ophthalmic solution Place 1 drop into both eyes at bedtime.  07/05/14  Yes Historical Provider, MD  topiramate (TOPAMAX) 100 MG tablet Take 100 mg by mouth every evening.   Yes Historical Provider, MD  VITAMIN D, CHOLECALCIFEROL, PO Take 2,000 Units by mouth daily.   Yes Historical Provider, MD  Vitamin D, Ergocalciferol, (DRISDOL) 50000 UNITS CAPS Take 50,000 Units by mouth every 7 (seven) days. On Wednesdays.   Yes Historical Provider, MD  DULoxetine (CYMBALTA) 30 MG capsule Take 30 mg by mouth daily.    Historical Provider, MD    Allergies:   Allergies  Allergen Reactions  . Adhesive [Tape] Other (See Comments)    BLISTERS  . Aspirin Other (See Comments)    Chest pain  . Avelox [Moxifloxacin] Hives  . Neomycin Other (See Comments)    EYE IRRITATION (EYE OINTMENT)  . Nsaids Other (See Comments)    Chest pain  . Sulfa Antibiotics Hives    Social History: married- husband is Dermatologist Drusilla Kanner, MD)- 1 daughter- 3 grandchildren Moved to [place] to be with  grandchildren in 5/08 Education: college Occupation: homemaker Tobacco: denies Alcohol: 1 glass 4x/week Drugs: denies  Family History: Father: bronchiectasis/COPD; Dementia-d 32 pneumonia; ETOHism Mother: hypertension, macular degeneration-->pulmonary fibrosis (d85) Grandparents: MGM-decease uterine cancer; MGF-deceased pancreatic cancer; paternal-old age (93, 10) Siblings: sister-hypertension Daughter: healthy  Physical Exam: Filed Vitals:   01/30/15 0130 01/30/15 0140 01/30/15 0216 01/30/15 0502  BP: 137/59  173/79 150/81  Pulse: 70  83 83  Temp:  98.2 F (36.8 C) 98 F (36.7 C) 98.1 F (36.7 C)  TempSrc:   Oral Oral  Resp:   19 19  Height:   5\' 2"  (1.575 m)   Weight:   67 kg (147 lb 11.3 oz)   SpO2: 97%  100% 100%   General appearance: fatigued; obvious vertigo with headmovement Head: Normocephalic, without obvious abnormality, atraumatic Eyes: conjunctivae/corneas clear. PERRL,; horizontal nystagmus Ears: chronic scarring of TM with no effusion or erythema Nose: Nares normal. Septum  midline. Mucosa normal. No drainage or sinus tenderness. Throat: lips, mucosa, and tongue normal; teeth and gums normal Neck: no adenopathy, no carotid bruit, no JVD and thyroid not enlarged, symmetric, no tenderness/mass/nodules Resp: clear to auscultation bilaterally Cardio: regular rate and rhythm without murmurs, rubs or gallops GI: soft, non-tender; bowel sounds normal; no masses,  no organomegaly Extremities: extremities normal, atraumatic, no cyanosis or edema Pulses: 2+ and symmetric Lymph nodes: Cervical adenopathy: no cervical lymphadenopathy Neurologic: Alert and oriented X 3, normal strength and tone. Gait was not assessed; April Round is positive with head movement to the right greater than left.  Symptomsreproducible and are worse with sitting up than lying back  Labs on Admission:  None obtained  Radiological Exams on Admission: Ct Head Wo Contrast  01/30/2015    CLINICAL DATA:  Nausea and vomiting and dizziness, onset 1900 hr tonight. No trauma.  EXAM: CT HEAD WITHOUT CONTRAST  TECHNIQUE: Contiguous axial images were obtained from the base of the skull through the vertex without intravenous contrast.  COMPARISON:  06/15/2012  FINDINGS: Mild cerebral atrophy. No ventricular dilatation or significant white matter changes. No mass effect or midline shift. No abnormal extra-axial fluid collections. Gray-white matter junctions are distinct. Basal cisterns are not effaced. No evidence of acute intracranial hemorrhage. No depressed skull fractures. Visualized paranasal sinuses and mastoid air cells are not opacified.  IMPRESSION: No acute intracranial abnormalities.   Electronically Signed   By: Lucienne Capers M.D.   On: 01/30/2015 00:27   Orders placed or performed during the hospital encounter of 12/08/11  . EKG    Assessment/Plan Principal Problem: 1. Vertigo-severe vertigo is likely secondary to acute onset BPPV.  CT shows no acute findings.  Given the acuity and severity of symptoms, will proceed with MRI of the brain to rule out stroke or CNS process.  Of note, she is allergic to aspirin, so we'll hold antiplatelet therapy unless she has evidence of infarct. Treat BPPV symptomatically with meclizine, Ativan, and Zofran as needed.  IV fluid hydration.  If MRI is negative,may be able to discharge later today with Epley maneuvers,meclizine, and Ativan as needed. I discussed these procedures and the diagnosis with her and her husband at length.  Consider ENT evaluation if no improvement. Active Problems: 2. Major depression-she has recently transitioned from Cymbalta to Lexapro.  I do not think this is contributing to her symptoms.  Continue Lexapro. 3. Neck pain/Facet arthritis of cervical region-we will obtain MRI of the cervical spine while doing her MRI  This was planned as an outpatient for evaluation of her chronic neck pain with radiculopathy.  Follow-up with  orthopedics. 4. Disposition-anticipate discharge later today or tomorrow if symptoms are improving to the point that she is able to manage at home.   Marton Redwood 01/30/2015, 7:30 AM

## 2015-01-30 NOTE — Care Management Note (Signed)
    Page 1 of 1   01/30/2015     2:24:17 PM CARE MANAGEMENT NOTE 01/30/2015  Patient:  April, Berg   Account Number:  [id number]  Date Initiated:  01/30/2015  Documentation initiated by:  Dessa Phi  Subjective/Objective Assessment:   69 y/o f admitted w/vertigo.     Action/Plan:   From home.   Anticipated DC Date:  01/30/2015   Anticipated DC Plan:  [place]  CM consult      Choice offered to / List presented to:             Status of service:  Completed, signed off Medicare Important Message given?   (If response is "NO", the following Medicare IM given date fields will be blank) Date Medicare IM given:   Medicare IM given by:   Date Additional Medicare IM given:   Additional Medicare IM given by:    Discharge Disposition:    Per UR Regulation:  Reviewed for med. necessity/level of care/duration of stay  If discussed at [place] of Stay Meetings, dates discussed:    Comments:  01/30/15 Dessa Phi RN BSN NCM [phone] No anticipated d/c needs.

## 2015-01-30 NOTE — ED Notes (Signed)
Patient transported to CT 

## 2015-01-30 NOTE — Discharge Summary (Signed)
DISCHARGE SUMMARY  April Berg  MR#: [id number]  DOB:October 13, 1946  Date of Admission: 01/29/2015 Date of Discharge: 01/30/2015  Attending Physician:Pernell Lenoir, April Berg  Patient's OMV:[facility], April Saxon, MD  Consults:  None  Discharge Diagnoses: Principal Problem:   Vertigo Active Problems:   Major depression   Facet arthritis of cervical region   Neck pain  Past Medical History  Diagnosis Date  . Complication of anesthesia 90's    block for elbow surgery and pt had a seizure... surgery since with no problem   . Hypothyroidism   . GERD (gastroesophageal reflux disease)   . Arthritis     osteoarthritis  . Spinal stenosis   . History of pericarditis     DEC 2008 AND SMALL PERICARDIAL EFFUSION--   RESOLVED  . Migraines   . Pelvic pain   . Vitamin D deficiency   . H/O hiatal hernia   . IBS (irritable bowel syndrome)   . Glaucoma of both eyes   . Hyperlipidemia   . Depression     Past Surgical History  Procedure Laterality Date  . Anterior cervical decomp/discectomy fusion  12/08/2011    Procedure: ANTERIOR CERVICAL DECOMPRESSION/DISCECTOMY FUSION 3 LEVELS;  Surgeon: April Ship, MD;  Location: [place];  Service: Orthopedics;  Laterality: N/A;  C 3-6 ACDF  . Cardiac catheterization  10-27-2007  April Berg April Berg    NORMAL CORONARY ARTERIES/ NORMAL LVF  . Transthoracic echocardiogram  03-04-2010    MODERATE LVH/ NORMAL LVSF/ MILD DIASTOLIC DYSFUNCTION/ EF 94%/ MILD AI  . Cardiovascular stress test  02-25-2010  April Berg CRENSHAW    ANTERIOR ATTENUATION NO SCAR OR ISCHEMIA/ EF 81%  . Tonsillectomy  1951  . Cesarean section  1975  . Elbow tendon release Left 1996  . Thumb tendon transposition Right 2012  . Glaucoma surgery Bilateral 2004  . Cataract extraction w/ intraocular lens  implant, bilateral    . Rhinoplasty  2010  . Total abdominal hysterectomy w/ bilateral salpingoophorectomy  1982  . Cysto with hydrodistension N/A 06/14/2013    Procedure:  CYSTOSCOPY/HYDRODISTENSION/INSTILLATION OF MARCAINE AND PYRIDIUM;  Surgeon: April Packer, MD;  Location: [place];  Service: Urology;  Laterality: N/A;    Discharge Medications:   Medication List    STOP taking these medications        DULoxetine 30 MG capsule  Commonly known as:  CYMBALTA      TAKE these medications        CALCIUM 600 PO  Take 1,200 mg by mouth daily.     cyclobenzaprine 10 MG tablet  Commonly known as:  FLEXERIL  Take 10 mg by mouth 3 (three) times daily as needed for muscle spasms (muscle spasms).     DEXILANT 60 MG capsule  Generic drug:  dexlansoprazole  Take 60 mg by mouth daily.     fexofenadine 180 MG tablet  Commonly known as:  ALLEGRA  Take 180 mg by mouth daily.     fluticasone 50 MCG/ACT nasal spray  Commonly known as:  FLONASE  Place 1 spray into both nostrils daily.     levothyroxine 112 MCG tablet  Commonly known as:  SYNTHROID, LEVOTHROID  Take 112 mcg by mouth daily before breakfast.     LORazepam 1 MG tablet  Commonly known as:  ATIVAN  Take 1 tablet (1 mg total) by mouth every 8 (eight) hours as needed for anxiety (vertigo).     meclizine 25 MG tablet  Commonly known as:  ANTIVERT  Take 1  tablet (25 mg total) by mouth 3 (three) times daily.     multivitamins ther. w/minerals Tabs tablet  Take 1 tablet by mouth daily.     ondansetron 4 MG tablet  Commonly known as:  ZOFRAN  Take 1 tablet (4 mg total) by mouth every 8 (eight) hours as needed for nausea or vomiting.     oxyCODONE-acetaminophen 10-325 MG per tablet  Commonly known as:  PERCOCET  Take 1 tablet by mouth every 6 (six) hours as needed for pain (pain).     ranitidine 150 MG tablet  Commonly known as:  ZANTAC  Take 150 mg by mouth at bedtime.     timolol 0.5 % ophthalmic solution  Commonly known as:  TIMOPTIC  Place 1 drop into both eyes at bedtime.     topiramate 100 MG tablet  Commonly known as:  TOPAMAX  Take 100 mg by mouth every  evening.     VITAMIN D (CHOLECALCIFEROL) PO  Take 2,000 Units by mouth daily.     Vitamin D (Ergocalciferol) 50000 UNITS Caps capsule  Commonly known as:  DRISDOL  Take 50,000 Units by mouth every 7 (seven) days. On Wednesdays.        Hospital Procedures: Ct Head Wo Contrast  01/30/2015   CLINICAL DATA:  Nausea and vomiting and dizziness, onset 1900 hr tonight. No trauma.  EXAM: CT HEAD WITHOUT CONTRAST  TECHNIQUE: Contiguous axial images were obtained from the base of the skull through the vertex without intravenous contrast.  COMPARISON:  06/15/2012  FINDINGS: Mild cerebral atrophy. No ventricular dilatation or significant white matter changes. No mass effect or midline shift. No abnormal extra-axial fluid collections. Gray-white matter junctions are distinct. Basal cisterns are not effaced. No evidence of acute intracranial hemorrhage. No depressed skull fractures. Visualized paranasal sinuses and mastoid air cells are not opacified.  IMPRESSION: No acute intracranial abnormalities.   Electronically Signed   By: April Berg M.D.   On: 01/30/2015 00:27   Mr April Berg IP Contrast  01/30/2015   CLINICAL DATA:  Nausea, vomiting, and vertigo.  Severe headache.  EXAM: MRI HEAD WITHOUT AND WITH CONTRAST  TECHNIQUE: Multiplanar, multiecho pulse sequences of the brain and surrounding structures were obtained without and with intravenous contrast.  CONTRAST:  15mL MULTIHANCE GADOBENATE DIMEGLUMINE 529 MG/ML IV SOLN  COMPARISON:  CT without contrast 01/30/2015  FINDINGS: The study is mildly degraded by patient motion.  No acute infarct, hemorrhage, or mass lesion is present. The ventricles are of normal size. No significant extraaxial fluid collection is present.  Flow is present in the major intracranial arteries. The patient is status post bilateral lens replacements. The globes and orbits are otherwise intact. The paranasal sinuses and the mastoid air cells are clear.  Skullbase is within normal  limits. Midline structures are unremarkable.  The postcontrast images demonstrate no pathologic enhancement  IMPRESSION: Negative MRI of the brain without and with contrast.   Electronically Signed   By: April Berg M.D.   On: 01/30/2015 12:15    History of Present Illness: April Berg is a 69 year old white female with a history of Migraine Headaches, hyperlipidemia, and migraine headaches who presented to the emergency department with acute onset of severe vertigo.patient and her husband state that she's been her usual state of health. She has been dealing with some ongoing neck pain with radiculopathy down the right arm and into the scapula. She had seen orthopedics at [facility] and locally with recommendations for MRI of the  cervical spine to direct additional treatment options. Other than this, she has been doing remarkably well. She is been doing some spring cleaning recently involving some leaning over. Last evening, after crawling through her car to find a coat. She sat down in her chair where she suddenly developed severe vertigo. She describes it as feeling like she had been on a twirling ride where the rhythm was swirlng around her. This is associated with 6 episodes of nausea and vomiting. Her symptoms persisted propping her to come to the emergency department where head CT was negative. On the emergency department, she received Ativan with minimal improvement. She was unable to ambulate without severe vertigo and nausea. She was admitted for further management.  She has a history of minor vertigo in the past but never this severe. She denies any significant headache, visual field deficits, weakness, numbness, tingling. She has had chronic ear issues, but none recently. No hearing loss, tinnitus. This morning, she feels a little better while lying flat with some mild vertigo symptoms, worse with movement and head turning.  Hospital Course: Tessie Fass was admitted to a telemetry bed.   She was placed on meclizine for vertigo symptoms and given Ativan as needed.  IV fluids were administered.  She underwent an MRI of the brain to rule out acute CNS process.  This was negative for stroke or mass.  As the day progressed, her symptoms gradually improved to the point that she was able to ambulate with assistance.  She and her husband were comfortable with discharge home with outpatient follow-up.  Day of Discharge Exam BP 168/103 mmHg  Pulse 109  Temp(Src) 98.1 F (36.7 C) (Oral)  Resp 14  Ht 5\' 2"  (1.575 m)  Wt 67 kg (147 lb 11.3 oz)  BMI 27.01 kg/m2  SpO2 99%  Physical Exam: General appearance: fatigued; obvious vertigo with headmovement Head: Normocephalic, without obvious abnormality, atraumatic Eyes: conjunctivae/corneas clear. PERRL,; horizontal nystagmus Ears: chronic scarring of TM with no effusion or erythema Nose: Nares normal. Septum midline. Mucosa normal. No drainage or sinus tenderness. Throat: lips, mucosa, and tongue normal; teeth and gums normal Neck: no adenopathy, no carotid bruit, no JVD and thyroid not enlarged, symmetric, no tenderness/mass/nodules Resp: clear to auscultation bilaterally Cardio: regular rate and rhythm without murmurs, rubs or gallops GI: soft, non-tender; bowel sounds normal; no masses, no organomegaly Extremities: extremities normal, atraumatic, no cyanosis or edema Pulses: 2+ and symmetric Lymph nodes: Cervical adenopathy: no cervical lymphadenopathy Neurologic: Alert and oriented X 3, normal strength and tone. Gait was not assessed; Marye Round is positive with head movement to the right greater than left. Symptomsreproducible and are worse with sitting up than lying back  Discharge Labs:  Recent Labs  01/30/15 0908  NA 142  K 4.1  CL 115*  CO2 23  GLUCOSE 99  BUN 20  CREATININE 0.99  CALCIUM 8.8     Recent Labs  01/30/15 0908  WBC 6.7  HGB 14.2  HCT 43.2  MCV 91.1  PLT 264    Discharge instructions:      Discharge Instructions    Diet - low sodium heart healthy    Complete by:  As directed      Discharge instructions    Complete by:  As directed   Take Meclizine 25mg  every 8 hours for 24-48 hours then as needed.  Do Epley Maneuvers 10 repetitions to each side 3 times a day for treatment and once a day for prevention.  If no improvement,  will refer to ENT or Vestibular Rehab     Increase activity slowly    Complete by:  As directed            Disposition: to home  Follow-up Appts: Follow-up with April Berg. Brigitte Pulse at [facility] in one week.  Our office will call for transition of care appointment.  Condition on Discharge:improved  Tests Needing Follow-up: none  Signed: Marton Redwood 01/30/2015, 5:56 PM

## 2015-01-30 NOTE — Progress Notes (Signed)
2mg  IV Ativan pulled from pyxis with intention to give 0.5mg  Ativan IV and to later have witnessed waste of 1.5mg  Ativan IV. Once 2mg  vial was pulled from pyxis but before med was scanned and given, MD ordered a one time 2mg  Ativan IV dose to aid pt in sedation during MRI. Full 2 mg Ativan IV given as per MD order. However, additional vial of IV Ativan was not pulled from Pyxis since original 2mg  IV Ativan vial was still available. WItnessed and count in pyxis was made correct with Susie RN as witness to both 2mg  Ativan being given and change in Ativan order from 0.5mg  to 2mg .

## 2015-02-04 DIAGNOSIS — R42 Dizziness and giddiness: Secondary | ICD-10-CM | POA: Diagnosis not present

## 2015-02-04 DIAGNOSIS — H8110 Benign paroxysmal vertigo, unspecified ear: Secondary | ICD-10-CM | POA: Diagnosis not present

## 2015-02-10 DIAGNOSIS — H8110 Benign paroxysmal vertigo, unspecified ear: Secondary | ICD-10-CM | POA: Diagnosis not present

## 2015-02-10 DIAGNOSIS — R42 Dizziness and giddiness: Secondary | ICD-10-CM | POA: Diagnosis not present

## 2015-02-11 DIAGNOSIS — Z882 Allergy status to sulfonamides status: Secondary | ICD-10-CM | POA: Diagnosis not present

## 2015-02-11 DIAGNOSIS — R2689 Other abnormalities of gait and mobility: Secondary | ICD-10-CM | POA: Diagnosis not present

## 2015-02-11 DIAGNOSIS — Z888 Allergy status to other drugs, medicaments and biological substances status: Secondary | ICD-10-CM | POA: Diagnosis not present

## 2015-02-11 DIAGNOSIS — R51 Headache: Secondary | ICD-10-CM | POA: Diagnosis not present

## 2015-02-11 DIAGNOSIS — R29818 Other symptoms and signs involving the nervous system: Secondary | ICD-10-CM | POA: Diagnosis not present

## 2015-02-11 DIAGNOSIS — E039 Hypothyroidism, unspecified: Secondary | ICD-10-CM | POA: Diagnosis not present

## 2015-02-11 DIAGNOSIS — H8103 Meniere's disease, bilateral: Secondary | ICD-10-CM | POA: Diagnosis not present

## 2015-02-11 DIAGNOSIS — K219 Gastro-esophageal reflux disease without esophagitis: Secondary | ICD-10-CM | POA: Diagnosis not present

## 2015-02-11 DIAGNOSIS — Z886 Allergy status to analgesic agent status: Secondary | ICD-10-CM | POA: Diagnosis not present

## 2015-02-11 DIAGNOSIS — M542 Cervicalgia: Secondary | ICD-10-CM | POA: Diagnosis not present

## 2015-02-11 DIAGNOSIS — H6981 Other specified disorders of Eustachian tube, right ear: Secondary | ICD-10-CM | POA: Diagnosis not present

## 2015-02-11 DIAGNOSIS — H9071 Mixed conductive and sensorineural hearing loss, unilateral, right ear, with unrestricted hearing on the contralateral side: Secondary | ICD-10-CM | POA: Diagnosis not present

## 2015-02-27 DIAGNOSIS — Z79891 Long term (current) use of opiate analgesic: Secondary | ICD-10-CM | POA: Diagnosis not present

## 2015-02-27 DIAGNOSIS — M25512 Pain in left shoulder: Secondary | ICD-10-CM | POA: Diagnosis not present

## 2015-02-27 DIAGNOSIS — M47812 Spondylosis without myelopathy or radiculopathy, cervical region: Secondary | ICD-10-CM | POA: Diagnosis not present

## 2015-02-27 DIAGNOSIS — G894 Chronic pain syndrome: Secondary | ICD-10-CM | POA: Diagnosis not present

## 2015-02-28 DIAGNOSIS — H8110 Benign paroxysmal vertigo, unspecified ear: Secondary | ICD-10-CM | POA: Diagnosis not present

## 2015-02-28 DIAGNOSIS — M2662 Arthralgia of temporomandibular joint: Secondary | ICD-10-CM | POA: Diagnosis not present

## 2015-02-28 DIAGNOSIS — H9071 Mixed conductive and sensorineural hearing loss, unilateral, right ear, with unrestricted hearing on the contralateral side: Secondary | ICD-10-CM | POA: Diagnosis not present

## 2015-02-28 DIAGNOSIS — R29818 Other symptoms and signs involving the nervous system: Secondary | ICD-10-CM | POA: Diagnosis not present

## 2015-02-28 DIAGNOSIS — H8101 Meniere's disease, right ear: Secondary | ICD-10-CM | POA: Diagnosis not present

## 2015-02-28 DIAGNOSIS — I10 Essential (primary) hypertension: Secondary | ICD-10-CM | POA: Diagnosis not present

## 2015-02-28 DIAGNOSIS — J309 Allergic rhinitis, unspecified: Secondary | ICD-10-CM | POA: Diagnosis not present

## 2015-02-28 DIAGNOSIS — H9311 Tinnitus, right ear: Secondary | ICD-10-CM | POA: Diagnosis not present

## 2015-02-28 DIAGNOSIS — M542 Cervicalgia: Secondary | ICD-10-CM | POA: Diagnosis not present

## 2015-02-28 DIAGNOSIS — Z6826 Body mass index (BMI) 26.0-26.9, adult: Secondary | ICD-10-CM | POA: Diagnosis not present

## 2015-02-28 DIAGNOSIS — K219 Gastro-esophageal reflux disease without esophagitis: Secondary | ICD-10-CM | POA: Diagnosis not present

## 2015-02-28 DIAGNOSIS — E039 Hypothyroidism, unspecified: Secondary | ICD-10-CM | POA: Diagnosis not present

## 2015-03-14 DIAGNOSIS — M5412 Radiculopathy, cervical region: Secondary | ICD-10-CM | POA: Diagnosis not present

## 2015-03-14 DIAGNOSIS — M4806 Spinal stenosis, lumbar region: Secondary | ICD-10-CM | POA: Diagnosis not present

## 2015-03-14 DIAGNOSIS — M5410 Radiculopathy, site unspecified: Secondary | ICD-10-CM | POA: Diagnosis not present

## 2015-03-14 DIAGNOSIS — M4316 Spondylolisthesis, lumbar region: Secondary | ICD-10-CM | POA: Diagnosis not present

## 2015-03-14 DIAGNOSIS — M545 Low back pain: Secondary | ICD-10-CM | POA: Diagnosis not present

## 2015-03-17 DIAGNOSIS — H5712 Ocular pain, left eye: Secondary | ICD-10-CM | POA: Diagnosis not present

## 2015-03-19 DIAGNOSIS — H8110 Benign paroxysmal vertigo, unspecified ear: Secondary | ICD-10-CM | POA: Diagnosis not present

## 2015-03-19 DIAGNOSIS — Z6826 Body mass index (BMI) 26.0-26.9, adult: Secondary | ICD-10-CM | POA: Diagnosis not present

## 2015-03-19 DIAGNOSIS — I10 Essential (primary) hypertension: Secondary | ICD-10-CM | POA: Diagnosis not present

## 2015-03-19 DIAGNOSIS — M542 Cervicalgia: Secondary | ICD-10-CM | POA: Diagnosis not present

## 2015-04-10 DIAGNOSIS — Z6826 Body mass index (BMI) 26.0-26.9, adult: Secondary | ICD-10-CM | POA: Diagnosis not present

## 2015-04-10 DIAGNOSIS — R05 Cough: Secondary | ICD-10-CM | POA: Diagnosis not present

## 2015-04-10 DIAGNOSIS — R509 Fever, unspecified: Secondary | ICD-10-CM | POA: Diagnosis not present

## 2015-04-10 DIAGNOSIS — J019 Acute sinusitis, unspecified: Secondary | ICD-10-CM | POA: Diagnosis not present

## 2015-04-14 DIAGNOSIS — M542 Cervicalgia: Secondary | ICD-10-CM | POA: Diagnosis not present

## 2015-04-14 DIAGNOSIS — M5412 Radiculopathy, cervical region: Secondary | ICD-10-CM | POA: Diagnosis not present

## 2015-04-29 DIAGNOSIS — M25552 Pain in left hip: Secondary | ICD-10-CM | POA: Diagnosis not present

## 2015-04-29 DIAGNOSIS — M25551 Pain in right hip: Secondary | ICD-10-CM | POA: Diagnosis not present

## 2015-04-29 DIAGNOSIS — M7062 Trochanteric bursitis, left hip: Secondary | ICD-10-CM | POA: Diagnosis not present

## 2015-04-29 DIAGNOSIS — M7061 Trochanteric bursitis, right hip: Secondary | ICD-10-CM | POA: Diagnosis not present

## 2015-04-30 DIAGNOSIS — Z79891 Long term (current) use of opiate analgesic: Secondary | ICD-10-CM | POA: Diagnosis not present

## 2015-04-30 DIAGNOSIS — M47812 Spondylosis without myelopathy or radiculopathy, cervical region: Secondary | ICD-10-CM | POA: Diagnosis not present

## 2015-04-30 DIAGNOSIS — G894 Chronic pain syndrome: Secondary | ICD-10-CM | POA: Diagnosis not present

## 2015-04-30 DIAGNOSIS — M25512 Pain in left shoulder: Secondary | ICD-10-CM | POA: Diagnosis not present

## 2015-06-03 DIAGNOSIS — H3531 Nonexudative age-related macular degeneration: Secondary | ICD-10-CM | POA: Diagnosis not present

## 2015-06-03 DIAGNOSIS — H35373 Puckering of macula, bilateral: Secondary | ICD-10-CM | POA: Diagnosis not present

## 2015-07-02 DIAGNOSIS — G43019 Migraine without aura, intractable, without status migrainosus: Secondary | ICD-10-CM | POA: Diagnosis not present

## 2015-07-02 DIAGNOSIS — G43719 Chronic migraine without aura, intractable, without status migrainosus: Secondary | ICD-10-CM | POA: Diagnosis not present

## 2015-07-02 DIAGNOSIS — G43809 Other migraine, not intractable, without status migrainosus: Secondary | ICD-10-CM | POA: Diagnosis not present

## 2015-07-15 DIAGNOSIS — K219 Gastro-esophageal reflux disease without esophagitis: Secondary | ICD-10-CM | POA: Diagnosis not present

## 2015-07-15 DIAGNOSIS — E785 Hyperlipidemia, unspecified: Secondary | ICD-10-CM | POA: Diagnosis not present

## 2015-07-15 DIAGNOSIS — M859 Disorder of bone density and structure, unspecified: Secondary | ICD-10-CM | POA: Diagnosis not present

## 2015-07-15 DIAGNOSIS — J328 Other chronic sinusitis: Secondary | ICD-10-CM | POA: Diagnosis not present

## 2015-07-15 DIAGNOSIS — I10 Essential (primary) hypertension: Secondary | ICD-10-CM | POA: Diagnosis not present

## 2015-07-15 DIAGNOSIS — H8101 Meniere's disease, right ear: Secondary | ICD-10-CM | POA: Diagnosis not present

## 2015-07-15 DIAGNOSIS — R2689 Other abnormalities of gait and mobility: Secondary | ICD-10-CM | POA: Diagnosis not present

## 2015-07-15 DIAGNOSIS — H9311 Tinnitus, right ear: Secondary | ICD-10-CM | POA: Diagnosis not present

## 2015-07-15 DIAGNOSIS — J3489 Other specified disorders of nose and nasal sinuses: Secondary | ICD-10-CM | POA: Diagnosis not present

## 2015-07-15 DIAGNOSIS — H908 Mixed conductive and sensorineural hearing loss, unspecified: Secondary | ICD-10-CM | POA: Diagnosis not present

## 2015-07-15 DIAGNOSIS — J329 Chronic sinusitis, unspecified: Secondary | ICD-10-CM | POA: Diagnosis not present

## 2015-07-15 DIAGNOSIS — R8290 Unspecified abnormal findings in urine: Secondary | ICD-10-CM | POA: Diagnosis not present

## 2015-07-15 DIAGNOSIS — N390 Urinary tract infection, site not specified: Secondary | ICD-10-CM | POA: Diagnosis not present

## 2015-07-15 DIAGNOSIS — J302 Other seasonal allergic rhinitis: Secondary | ICD-10-CM | POA: Diagnosis not present

## 2015-07-15 DIAGNOSIS — Z79899 Other long term (current) drug therapy: Secondary | ICD-10-CM | POA: Diagnosis not present

## 2015-07-15 DIAGNOSIS — G43909 Migraine, unspecified, not intractable, without status migrainosus: Secondary | ICD-10-CM | POA: Diagnosis not present

## 2015-07-15 DIAGNOSIS — H9071 Mixed conductive and sensorineural hearing loss, unilateral, right ear, with unrestricted hearing on the contralateral side: Secondary | ICD-10-CM | POA: Diagnosis not present

## 2015-07-15 DIAGNOSIS — Z881 Allergy status to other antibiotic agents status: Secondary | ICD-10-CM | POA: Diagnosis not present

## 2015-07-15 DIAGNOSIS — J309 Allergic rhinitis, unspecified: Secondary | ICD-10-CM | POA: Diagnosis not present

## 2015-07-15 DIAGNOSIS — E039 Hypothyroidism, unspecified: Secondary | ICD-10-CM | POA: Diagnosis not present

## 2015-07-15 DIAGNOSIS — Z888 Allergy status to other drugs, medicaments and biological substances status: Secondary | ICD-10-CM | POA: Diagnosis not present

## 2015-07-15 DIAGNOSIS — Z886 Allergy status to analgesic agent status: Secondary | ICD-10-CM | POA: Diagnosis not present

## 2015-07-15 DIAGNOSIS — Z9889 Other specified postprocedural states: Secondary | ICD-10-CM | POA: Diagnosis not present

## 2015-07-18 DIAGNOSIS — H8110 Benign paroxysmal vertigo, unspecified ear: Secondary | ICD-10-CM | POA: Diagnosis not present

## 2015-07-18 DIAGNOSIS — M4806 Spinal stenosis, lumbar region: Secondary | ICD-10-CM | POA: Diagnosis not present

## 2015-07-18 DIAGNOSIS — E039 Hypothyroidism, unspecified: Secondary | ICD-10-CM | POA: Diagnosis not present

## 2015-07-18 DIAGNOSIS — Z23 Encounter for immunization: Secondary | ICD-10-CM | POA: Diagnosis not present

## 2015-07-18 DIAGNOSIS — I10 Essential (primary) hypertension: Secondary | ICD-10-CM | POA: Diagnosis not present

## 2015-07-18 DIAGNOSIS — M545 Low back pain: Secondary | ICD-10-CM | POA: Diagnosis not present

## 2015-07-18 DIAGNOSIS — G43909 Migraine, unspecified, not intractable, without status migrainosus: Secondary | ICD-10-CM | POA: Diagnosis not present

## 2015-07-18 DIAGNOSIS — M5410 Radiculopathy, site unspecified: Secondary | ICD-10-CM | POA: Diagnosis not present

## 2015-07-18 DIAGNOSIS — F329 Major depressive disorder, single episode, unspecified: Secondary | ICD-10-CM | POA: Diagnosis not present

## 2015-07-18 DIAGNOSIS — Z1389 Encounter for screening for other disorder: Secondary | ICD-10-CM | POA: Diagnosis not present

## 2015-07-18 DIAGNOSIS — M4800 Spinal stenosis, site unspecified: Secondary | ICD-10-CM | POA: Diagnosis not present

## 2015-07-18 DIAGNOSIS — Z6826 Body mass index (BMI) 26.0-26.9, adult: Secondary | ICD-10-CM | POA: Diagnosis not present

## 2015-07-18 DIAGNOSIS — Z0000 Encounter for general adult medical examination without abnormal findings: Secondary | ICD-10-CM | POA: Diagnosis not present

## 2015-07-18 DIAGNOSIS — E785 Hyperlipidemia, unspecified: Secondary | ICD-10-CM | POA: Diagnosis not present

## 2015-07-18 DIAGNOSIS — M542 Cervicalgia: Secondary | ICD-10-CM | POA: Diagnosis not present

## 2015-07-18 DIAGNOSIS — M5412 Radiculopathy, cervical region: Secondary | ICD-10-CM | POA: Diagnosis not present

## 2015-07-21 DIAGNOSIS — Z1212 Encounter for screening for malignant neoplasm of rectum: Secondary | ICD-10-CM | POA: Diagnosis not present

## 2015-08-26 ENCOUNTER — Encounter (HOSPITAL_COMMUNITY): Payer: Self-pay | Admitting: Emergency Medicine

## 2015-08-26 ENCOUNTER — Emergency Department (HOSPITAL_COMMUNITY)
Admission: EM | Admit: 2015-08-26 | Discharge: 2015-08-27 | Disposition: A | Discharge status: Home / Self Care | Payer: Medicare Other | Attending: Emergency Medicine | Admitting: Emergency Medicine

## 2015-08-26 DIAGNOSIS — G43909 Migraine, unspecified, not intractable, without status migrainosus: Secondary | ICD-10-CM | POA: Insufficient documentation

## 2015-08-26 DIAGNOSIS — Z7951 Long term (current) use of inhaled steroids: Secondary | ICD-10-CM | POA: Insufficient documentation

## 2015-08-26 DIAGNOSIS — Z79899 Other long term (current) drug therapy: Secondary | ICD-10-CM | POA: Insufficient documentation

## 2015-08-26 DIAGNOSIS — R197 Diarrhea, unspecified: Secondary | ICD-10-CM | POA: Diagnosis not present

## 2015-08-26 DIAGNOSIS — E039 Hypothyroidism, unspecified: Secondary | ICD-10-CM | POA: Insufficient documentation

## 2015-08-26 DIAGNOSIS — M1990 Unspecified osteoarthritis, unspecified site: Secondary | ICD-10-CM | POA: Insufficient documentation

## 2015-08-26 DIAGNOSIS — F329 Major depressive disorder, single episode, unspecified: Secondary | ICD-10-CM | POA: Diagnosis not present

## 2015-08-26 DIAGNOSIS — K219 Gastro-esophageal reflux disease without esophagitis: Secondary | ICD-10-CM | POA: Insufficient documentation

## 2015-08-26 DIAGNOSIS — R42 Dizziness and giddiness: Secondary | ICD-10-CM

## 2015-08-26 DIAGNOSIS — E559 Vitamin D deficiency, unspecified: Secondary | ICD-10-CM | POA: Diagnosis not present

## 2015-08-26 LAB — BASIC METABOLIC PANEL
Anion gap: 5 (ref 5–15)
BUN: 19 mg/dL (ref 6–20)
CALCIUM: 8.7 mg/dL — AB (ref 8.9–10.3)
CO2: 23 mmol/L (ref 22–32)
Chloride: 112 mmol/L — ABNORMAL HIGH (ref 101–111)
Creatinine, Ser: 0.85 mg/dL (ref 0.44–1.00)
GFR calc Af Amer: >60 mL/min (ref >60)
GFR calc non Af Amer: >60 mL/min (ref >60)
Glucose, Bld: 103 mg/dL — ABNORMAL HIGH (ref 65–99)
Potassium: 3.7 mmol/L (ref 3.5–5.1)
SODIUM: 140 mmol/L (ref 135–145)

## 2015-08-26 LAB — URINALYSIS, ROUTINE W REFLEX MICROSCOPIC
Bilirubin Urine: NEGATIVE
GLUCOSE, UA: NEGATIVE mg/dL
HGB URINE DIPSTICK: NEGATIVE
KETONES UR: NEGATIVE mg/dL
LEUKOCYTES UA: NEGATIVE
Nitrite: NEGATIVE
PROTEIN: NEGATIVE mg/dL
Specific Gravity, Urine: 1.009 (ref 1.005–1.030)
Urobilinogen, UA: 0.2 mg/dL (ref 0.0–1.0)
pH: 6.5 (ref 5.0–8.0)

## 2015-08-26 LAB — CBC
HCT: 44.3 % (ref 36.0–46.0)
Hemoglobin: 14.8 g/dL (ref 12.0–15.0)
MCH: 30.8 pg (ref 26.0–34.0)
MCHC: 33.4 g/dL (ref 30.0–36.0)
MCV: 92.1 fL (ref 78.0–100.0)
PLATELETS: 251 10*3/uL (ref 150–400)
RBC: 4.81 MIL/uL (ref 3.87–5.11)
RDW: 13 % (ref 11.5–15.5)
WBC: 8.4 10*3/uL (ref 4.0–10.5)

## 2015-08-26 MED ORDER — SODIUM CHLORIDE 0.9 % IV SOLN
1000.0000 mL | Freq: Once | INTRAVENOUS | Status: AC
  Administered 2015-08-26: 1000 mL via INTRAVENOUS

## 2015-08-26 MED ORDER — ONDANSETRON HCL 4 MG/2ML IJ SOLN: 4.0000 mg | Freq: Once | INTRAMUSCULAR | Status: DC

## 2015-08-26 MED ORDER — SUMATRIPTAN SUCCINATE 6 MG/0.5ML ~~LOC~~ SOLN
6.0000 mg | Freq: Once | SUBCUTANEOUS | Status: AC
Start: 2015-08-26 — End: 2015-08-27
  Administered 2015-08-27: 6 mg via SUBCUTANEOUS
  Filled 2015-08-26: qty 0.5

## 2015-08-26 MED ORDER — DIPHENHYDRAMINE HCL 50 MG/ML IJ SOLN
12.5000 mg | Freq: Once | INTRAMUSCULAR | Status: AC
  Administered 2015-08-26: 12.5 mg via INTRAVENOUS
  Filled 2015-08-26: qty 1

## 2015-08-26 MED ORDER — LORAZEPAM 2 MG/ML IJ SOLN
1.0000 mg | Freq: Once | INTRAMUSCULAR | Status: AC
  Administered 2015-08-26: 1 mg via INTRAVENOUS
  Filled 2015-08-26: qty 1

## 2015-08-26 MED ORDER — MECLIZINE HCL 25 MG PO TABS
25.0000 mg | ORAL_TABLET | Freq: Once | ORAL | Status: AC
  Administered 2015-08-26: 25 mg via ORAL
  Filled 2015-08-26: qty 1

## 2015-08-26 MED ORDER — SODIUM CHLORIDE 0.9 % IV SOLN
Freq: Once | INTRAVENOUS | Status: AC
  Administered 2015-08-26: 1000 mL/h via INTRAVENOUS

## 2015-08-26 MED ORDER — SODIUM CHLORIDE 0.9 % IV SOLN
1000.0000 mL | INTRAVENOUS | Status: DC
  Administered 2015-08-26: 1000 mL via INTRAVENOUS

## 2015-08-26 MED ORDER — SUMATRIPTAN SUCCINATE 100 MG PO TABS: 100.0000 mg | ORAL_TABLET | ORAL | Status: DC | PRN

## 2015-08-26 MED ORDER — METOCLOPRAMIDE HCL 5 MG/ML IJ SOLN
10.0000 mg | Freq: Once | INTRAMUSCULAR | Status: AC
  Administered 2015-08-26: 10 mg via INTRAVENOUS
  Filled 2015-08-26: qty 2

## 2015-08-26 MED ORDER — ONDANSETRON HCL 4 MG/2ML IJ SOLN
4.0000 mg | Freq: Once | INTRAMUSCULAR | Status: AC
  Administered 2015-08-26: 4 mg via INTRAVENOUS
  Filled 2015-08-26: qty 2

## 2015-08-26 MED ORDER — ONDANSETRON 4 MG PO TBDP: 4.0000 mg | ORAL_TABLET | Freq: Three times a day (TID) | ORAL | Status: DC | PRN

## 2015-08-26 MED ORDER — MECLIZINE HCL 50 MG PO TABS: 50.0000 mg | ORAL_TABLET | Freq: Three times a day (TID) | ORAL | Status: DC | PRN

## 2015-08-26 NOTE — ED Notes (Signed)
Patient stated that she cannot give sample at this time. Will call out when ready.

## 2015-08-26 NOTE — ED Notes (Signed)
Per husband, has a history of vertigo-had symptoms in [place] worked up-states having symptoms last night around 7 pm-states not able to eat or drink-now weak and has headache

## 2015-08-26 NOTE — ED Provider Notes (Signed)
CSN: [id number]     Arrival date & time 08/26/15  1521 History   First MD Initiated Contact with Patient 08/26/15 1544     Chief Complaint  Patient presents with  . Dizziness     (Consider location/radiation/quality/duration/timing/severity/associated sxs/prior Treatment) Patient is a 69 y.o. female presenting with dizziness. The history is provided by the patient. No language interpreter was used.  Dizziness Quality:  Lightheadedness and vertigo Severity:  Moderate Onset quality:  Gradual Duration:  1 day Timing:  Constant Progression:  Worsening Chronicity:  New Relieved by:  Nothing Worsened by:  Nothing Ineffective treatments:  None tried Associated symptoms: diarrhea and headaches   Risk factors: hx of vertigo   Risk factors: no anemia   Pt reports she has had vertigo in the past.  Pt was treated for the same in march.  Pt had a normal MRI at that time.  Pt reports she has had diarrhea all day.  Pt thinks she may be dehydrated.    Past Medical History  Diagnosis Date  . Complication of anesthesia 90's    block for elbow surgery and pt had a seizure... surgery since with no problem   . Hypothyroidism   . GERD (gastroesophageal reflux disease)   . Arthritis     osteoarthritis  . Spinal stenosis   . History of pericarditis     DEC 2008 AND SMALL PERICARDIAL EFFUSION--   RESOLVED  . Migraines   . Pelvic pain   . Vitamin D deficiency   . H/O hiatal hernia   . IBS (irritable bowel syndrome)   . Glaucoma of both eyes   . Hyperlipidemia   . Depression    Past Surgical History  Procedure Laterality Date  . Anterior cervical decomp/discectomy fusion  12/08/2011    Procedure: ANTERIOR CERVICAL DECOMPRESSION/DISCECTOMY FUSION 3 LEVELS;  Surgeon: Sinclair Ship, MD;  Location: [place];  Service: Orthopedics;  Laterality: N/A;  C 3-6 ACDF  . Cardiac catheterization  10-27-2007  DR BRUCE BRODIE    NORMAL CORONARY ARTERIES/ NORMAL LVF  . Transthoracic echocardiogram   03-04-2010    MODERATE LVH/ NORMAL LVSF/ MILD DIASTOLIC DYSFUNCTION/ EF 40%/ MILD AI  . Cardiovascular stress test  02-25-2010  DR CRENSHAW    ANTERIOR ATTENUATION NO SCAR OR ISCHEMIA/ EF 81%  . Tonsillectomy  1951  . Cesarean section  1975  . Elbow tendon release Left 1996  . Thumb tendon transposition Right 2012  . Glaucoma surgery Bilateral 2004  . Cataract extraction w/ intraocular lens  implant, bilateral    . Rhinoplasty  2010  . Total abdominal hysterectomy w/ bilateral salpingoophorectomy  1982  . Cysto with hydrodistension N/A 06/14/2013    Procedure: CYSTOSCOPY/HYDRODISTENSION/INSTILLATION OF MARCAINE AND PYRIDIUM;  Surgeon: Reece Packer, MD;  Location: [place];  Service: Urology;  Laterality: N/A;   Family History  Problem Relation Age of Onset  . Pulmonary fibrosis Mother   . Lung disease Father    Social History  Substance Use Topics  . Smoking status: Never Smoker   . Smokeless tobacco: Never Used  . Alcohol Use: 1.2 oz/week    2 Glasses of wine per week   OB History    No data available     Review of Systems  Gastrointestinal: Positive for diarrhea.  Neurological: Positive for dizziness and headaches.  All other systems reviewed and are negative.     Allergies  Adhesive; Aspirin; Avelox; Chlorthalidone; Hydrochlorothiazide; Neomycin; Nsaids; and Sulfa antibiotics  Home Medications  Prior to Admission medications   Medication Sig Start Date End Date Taking? Authorizing Provider  Calcium Carbonate (CALCIUM 600 PO) Take 1,200 mg by mouth daily.   Yes Historical Provider, MD  cyclobenzaprine (FLEXERIL) 10 MG tablet Take 10 mg by mouth 3 (three) times daily as needed for muscle spasms (muscle spasms).    Yes Historical Provider, MD  dexlansoprazole (DEXILANT) 60 MG capsule Take 60 mg by mouth daily.   Yes Historical Provider, MD  escitalopram (LEXAPRO) 10 MG tablet Take 1 tablet by mouth daily. 08/18/15  Yes Historical Provider, MD   fexofenadine (ALLEGRA) 180 MG tablet Take 180 mg by mouth daily.   Yes Historical Provider, MD  fluticasone (FLONASE) 50 MCG/ACT nasal spray Place 1 spray into both nostrils daily.    Yes Historical Provider, MD  levothyroxine (SYNTHROID, LEVOTHROID) 125 MCG tablet Take 125 mcg by mouth daily before breakfast.   Yes Historical Provider, MD  LORazepam (ATIVAN) 1 MG tablet Take 1 tablet (1 mg total) by mouth every 8 (eight) hours as needed for anxiety (vertigo). 01/30/15  Yes Marton Redwood, MD  meclizine (ANTIVERT) 25 MG tablet Take 1 tablet (25 mg total) by mouth 3 (three) times daily. 01/30/15  Yes Marton Redwood, MD  Multiple Vitamins-Minerals (MULTIVITAMINS THER. W/MINERALS) TABS Take 1 tablet by mouth daily.   Yes Historical Provider, MD  ondansetron (ZOFRAN) 4 MG tablet Take 1 tablet (4 mg total) by mouth every 8 (eight) hours as needed for nausea or vomiting. 01/30/15  Yes Marton Redwood, MD  oxyCODONE-acetaminophen (PERCOCET) 10-325 MG per tablet Take 1 tablet by mouth every 6 (six) hours as needed for pain (pain).  08/01/14  Yes Historical Provider, MD  ranitidine (ZANTAC) 150 MG tablet Take 150 mg by mouth at bedtime.  05/02/14  Yes Historical Provider, MD  SUMAtriptan (IMITREX) 100 MG tablet Take 1 tablet by mouth. As needed for migraine. May take a second dose after 2 hours if needed.   Yes Historical Provider, MD  telmisartan (MICARDIS) 80 MG tablet Take 1 tablet by mouth daily. 08/20/15  Yes Historical Provider, MD  timolol (TIMOPTIC) 0.5 % ophthalmic solution Place 1 drop into both eyes at bedtime.  07/05/14  Yes Historical Provider, MD  topiramate (TOPAMAX) 100 MG tablet Take 100 mg by mouth every evening.   Yes Historical Provider, MD  VITAMIN D, CHOLECALCIFEROL, PO Take 2,000 Units by mouth daily.   Yes Historical Provider, MD  Vitamin D, Ergocalciferol, (DRISDOL) 50000 UNITS CAPS Take 50,000 Units by mouth every 7 (seven) days. On Wednesdays.   Yes Historical Provider, MD   BP 133/77 mmHg   Pulse 88  Temp(Src) 97.8 F (36.6 C) (Oral)  Resp 18  SpO2 100% Physical Exam  Constitutional: She is oriented to person, place, and time. She appears well-developed and well-nourished.  HENT:  Head: Normocephalic and atraumatic.  Right Ear: External ear normal.  Eyes: Conjunctivae and EOM are normal. Pupils are equal, round, and reactive to light.  Neck: Normal range of motion.  Cardiovascular: Normal rate and normal heart sounds.   Pulmonary/Chest: Effort normal.  Abdominal: Soft. She exhibits no distension.  Musculoskeletal: Normal range of motion.  Neurological: She is alert and oriented to person, place, and time.  Skin: Skin is warm.  Psychiatric: She has a normal mood and affect.  Nursing note and vitals reviewed.   ED Course  Procedures (including critical care time) Labs Review Labs Reviewed  BASIC METABOLIC PANEL - Abnormal; Notable for the following:    Chloride  112 (*)    Glucose, Bld 103 (*)    Calcium 8.7 (*)    All other components within normal limits  CBC  URINALYSIS, ROUTINE W REFLEX MICROSCOPIC (NOT AT [facility])    Imaging Review No results found. I have personally reviewed and evaluated these images and lab results as part of my medical decision-making.   EKG Interpretation   Date/Time:  Tuesday August 26 2015 15:42:48 EDT Ventricular Rate:  85 PR Interval:  146 QRS Duration: 76 QT Interval:  332 QTC Calculation: 395 R Axis:   38 Text Interpretation:  Sinus rhythm Abnormal R-wave progression, early  transition Nonspecific T abnormalities, diffuse leads Confirmed by  Alvino Chapel  MD, NATHAN [phone]) on 08/26/2015 5:13:13 PM      MDM  Pt given Iv fluids x 2 liters total.  Pt had some relief with zofran and ativan Iv.  Pt complains of a headache.  She has a history of migraines and headache is the same.  Pt request imitrex.  Dr. Alvino Chapel in to see and examine.   Pt given note that she can not travel.  Pt is suppose to leave tomorrow for [place].     Final diagnoses:  Vertigo  Migraine without status migrainosus, not intractable, unspecified migraine type    Meds ordered this encounter  Medications  . 0.9 %  sodium chloride infusion    Sig:    Followed by  . 0.9 %  sodium chloride infusion    Sig:    Followed by  . 0.9 %  sodium chloride infusion    Sig:   . LORazepam (ATIVAN) injection 1 mg    Sig:   . ondansetron (ZOFRAN) injection 4 mg    Sig:   . levothyroxine (SYNTHROID, LEVOTHROID) 125 MCG tablet    Sig: Take 125 mcg by mouth daily before breakfast.  . DISCONTD: SUMAtriptan (IMITREX) 100 MG tablet    Sig: Take 1 tablet by mouth. As needed for migraine. May take a second dose after 2 hours if needed.  Marland Kitchen telmisartan (MICARDIS) 80 MG tablet    Sig: Take 1 tablet by mouth daily.  Marland Kitchen escitalopram (LEXAPRO) 10 MG tablet    Sig: Take 1 tablet by mouth daily.  . meclizine (ANTIVERT) tablet 25 mg    Sig:   . 0.9 %  sodium chloride infusion    Sig:   . DISCONTD: ondansetron (ZOFRAN) injection 4 mg    Sig:   . metoCLOPramide (REGLAN) injection 10 mg    Sig:   . diphenhydrAMINE (BENADRYL) injection 12.5 mg    Sig:   . SUMAtriptan (IMITREX) injection 6 mg    Sig:   . SUMAtriptan (IMITREX) 100 MG tablet    Sig: Take 1 tablet (100 mg total) by mouth every 2 (two) hours as needed for migraine. May repeat in 2 hours if headache persists or recurs.    Dispense:  10 tablet    Refill:  0    Order Specific Question:  Supervising Provider    Answer:  Sabra Heck, BRIAN [[id number]]  . ondansetron (ZOFRAN ODT) 4 MG disintegrating tablet    Sig: Take 1 tablet (4 mg total) by mouth every 8 (eight) hours as needed for nausea or vomiting.    Dispense:  20 tablet    Refill:  0    Order Specific Question:  Supervising Provider    Answer:  MILLER, BRIAN [[id number]]  . meclizine (ANTIVERT) 50 MG tablet    Sig: Take 1 tablet (50 mg  total) by mouth 3 (three) times daily as needed.    Dispense:  30 tablet    Refill:  0    Order Specific Question:   Supervising Provider    Answer:  Noemi Chapel [place], PA-C 08/27/15 0028  Davonna Belling, MD 08/27/15 [phone]

## 2015-08-26 NOTE — Discharge Instructions (Signed)
Benign Positional Vertigo °Vertigo is the feeling that you or your surroundings are moving when they are not. Benign positional vertigo is the most common form of vertigo. The cause of this condition is not serious (is benign). This condition is triggered by certain movements and positions (is positional). This condition can be dangerous if it occurs while you are doing something that could endanger you or others, such as driving.  °CAUSES °In many cases, the cause of this condition is not known. It may be caused by a disturbance in an area of the inner ear that helps your brain to sense movement and balance. This disturbance can be caused by a viral infection (labyrinthitis), head injury, or repetitive motion. °RISK FACTORS °This condition is more likely to develop in: °· Women. °· People who are 50 years of age or older. °SYMPTOMS °Symptoms of this condition usually happen when you move your head or your eyes in different directions. Symptoms may start suddenly, and they usually last for less than a minute. Symptoms may include: °· Loss of balance and falling. °· Feeling like you are spinning or moving. °· Feeling like your surroundings are spinning or moving. °· Nausea and vomiting. °· Blurred vision. °· Dizziness. °· Involuntary eye movement (nystagmus). °Symptoms can be mild and cause only slight annoyance, or they can be severe and interfere with daily life. Episodes of benign positional vertigo may return (recur) over time, and they may be triggered by certain movements. Symptoms may improve over time. °DIAGNOSIS °This condition is usually diagnosed by medical history and a physical exam of the head, neck, and ears. You may be referred to a health care provider who specializes in ear, nose, and throat (ENT) problems (otolaryngologist) or a provider who specializes in disorders of the nervous system (neurologist). You may have additional testing, including: °· MRI. °· A CT scan. °· Eye movement tests. Your  health care provider may ask you to change positions quickly while he or she watches you for symptoms of benign positional vertigo, such as nystagmus. Eye movement may be tested with an electronystagmogram (ENG), caloric stimulation, the Dix-Hallpike test, or the roll test. °· An electroencephalogram (EEG). This records electrical activity in your brain. °· Hearing tests. °TREATMENT °Usually, your health care provider will treat this by moving your head in specific positions to adjust your inner ear back to normal. Surgery may be needed in severe cases, but this is rare. In some cases, benign positional vertigo may resolve on its own in 2-4 weeks. °HOME CARE INSTRUCTIONS °Safety °· Move slowly. Avoid sudden body or head movements. °· Avoid driving. °· Avoid operating heavy machinery. °· Avoid doing any tasks that would be dangerous to you or others if a vertigo episode would occur. °· If you have trouble walking or keeping your balance, try using a cane for stability. If you feel dizzy or unstable, sit down right away. °· Return to your normal activities as told by your health care provider. Ask your health care provider what activities are safe for you. °General Instructions °· Take over-the-counter and prescription medicines only as told by your health care provider. °· Avoid certain positions or movements as told by your health care provider. °· Drink enough fluid to keep your urine clear or pale yellow. °· Keep all follow-up visits as told by your health care provider. This is important. °SEEK MEDICAL CARE IF: °· You have a fever. °· Your condition gets worse or you develop new symptoms. °· Your family or friends   notice any behavioral changes.  Your nausea or vomiting gets worse.  You have numbness or a "pins and needles" sensation. SEEK IMMEDIATE MEDICAL CARE IF:  You have difficulty speaking or moving.  You are always dizzy.  You faint.  You develop severe headaches.  You have weakness in your  legs or arms.  You have changes in your hearing or vision.  You develop a stiff neck.  You develop sensitivity to light.   This information is not intended to replace advice given to you by your health care provider. Make sure you discuss any questions you have with your health care provider.   Document Released: 08/16/2006 Document Revised: 07/30/2015 Document Reviewed: 03/03/2015 [employer] Interactive Patient Education 2016 [employer] Headaches, Frequently Asked Questions MIGRAINE HEADACHES Q: What is migraine? What causes it? How can I treat it? A: Generally, migraine headaches begin as a dull ache. Then they develop into a constant, throbbing, and pulsating pain. You may experience pain at the temples. You may experience pain at the front or back of one or both sides of the head. The pain is usually accompanied by a combination of:  Nausea.  Vomiting.  Sensitivity to light and noise. Some people (about 15%) experience an aura (see below) before an attack. The cause of migraine is believed to be chemical reactions in the brain. Treatment for migraine may include over-the-counter or prescription medications. It may also include self-help techniques. These include relaxation training and biofeedback.  Q: What is an aura? A: About 15% of people with migraine get an "aura". This is a sign of neurological symptoms that occur before a migraine headache. You may see wavy or jagged lines, dots, or flashing lights. You might experience tunnel vision or blind spots in one or both eyes. The aura can include visual or auditory hallucinations (something imagined). It may include disruptions in smell (such as strange odors), taste or touch. Other symptoms include:  Numbness.  A "pins and needles" sensation.  Difficulty in recalling or speaking the correct word. These neurological events may last as long as 60 minutes. These symptoms will fade as the headache begins. Q: What is a trigger? A:  Certain physical or environmental factors can lead to or "trigger" a migraine. These include:  Foods.  Hormonal changes.  Weather.  Stress. It is important to remember that triggers are different for everyone. To help prevent migraine attacks, you need to figure out which triggers affect you. Keep a headache diary. This is a good way to track triggers. The diary will help you talk to your healthcare professional about your condition. Q: Does weather affect migraines? A: Bright sunshine, hot, humid conditions, and drastic changes in barometric pressure may lead to, or "trigger," a migraine attack in some people. But studies have shown that weather does not act as a trigger for everyone with migraines. Q: What is the link between migraine and hormones? A: Hormones start and regulate many of your body's functions. Hormones keep your body in balance within a constantly changing environment. The levels of hormones in your body are unbalanced at times. Examples are during menstruation, pregnancy, or menopause. That can lead to a migraine attack. In fact, about three quarters of all women with migraine report that their attacks are related to the menstrual cycle.  Q: Is there an increased risk of stroke for migraine sufferers? A: The likelihood of a migraine attack causing a stroke is very remote. That is not to say that migraine sufferers cannot have  a stroke associated with their migraines. In persons under age 29, the most common associated factor for stroke is migraine headache. But over the course of a person's normal life span, the occurrence of migraine headache may actually be associated with a reduced risk of dying from cerebrovascular disease due to stroke.  Q: What are acute medications for migraine? A: Acute medications are used to treat the pain of the headache after it has started. Examples over-the-counter medications, NSAIDs, ergots, and triptans.  Q: What are the triptans? A: Triptans  are the newest class of abortive medications. They are specifically targeted to treat migraine. Triptans are vasoconstrictors. They moderate some chemical reactions in the brain. The triptans work on receptors in your brain. Triptans help to restore the balance of a neurotransmitter called serotonin. Fluctuations in levels of serotonin are thought to be a main cause of migraine.  Q: Are over-the-counter medications for migraine effective? A: Over-the-counter, or "OTC," medications may be effective in relieving mild to moderate pain and associated symptoms of migraine. But you should see your caregiver before beginning any treatment regimen for migraine.  Q: What are preventive medications for migraine? A: Preventive medications for migraine are sometimes referred to as "prophylactic" treatments. They are used to reduce the frequency, severity, and length of migraine attacks. Examples of preventive medications include antiepileptic medications, antidepressants, beta-blockers, calcium channel blockers, and NSAIDs (nonsteroidal anti-inflammatory drugs). Q: Why are anticonvulsants used to treat migraine? A: During the past few years, there has been an increased interest in antiepileptic drugs for the prevention of migraine. They are sometimes referred to as "anticonvulsants". Both epilepsy and migraine may be caused by similar reactions in the brain.  Q: Why are antidepressants used to treat migraine? A: Antidepressants are typically used to treat people with depression. They may reduce migraine frequency by regulating chemical levels, such as serotonin, in the brain.  Q: What alternative therapies are used to treat migraine? A: The term "alternative therapies" is often used to describe treatments considered outside the scope of conventional Western medicine. Examples of alternative therapy include acupuncture, acupressure, and yoga. Another common alternative treatment is herbal therapy. Some herbs are  believed to relieve headache pain. Always discuss alternative therapies with your caregiver before proceeding. Some herbal products contain arsenic and other toxins. TENSION HEADACHES Q: What is a tension-type headache? What causes it? How can I treat it? A: Tension-type headaches occur randomly. They are often the result of temporary stress, anxiety, fatigue, or anger. Symptoms include soreness in your temples, a tightening band-like sensation around your head (a "vice-like" ache). Symptoms can also include a pulling feeling, pressure sensations, and contracting head and neck muscles. The headache begins in your forehead, temples, or the back of your head and neck. Treatment for tension-type headache may include over-the-counter or prescription medications. Treatment may also include self-help techniques such as relaxation training and biofeedback. CLUSTER HEADACHES Q: What is a cluster headache? What causes it? How can I treat it? A: Cluster headache gets its name because the attacks come in groups. The pain arrives with little, if any, warning. It is usually on one side of the head. A tearing or bloodshot eye and a runny nose on the same side of the headache may also accompany the pain. Cluster headaches are believed to be caused by chemical reactions in the brain. They have been described as the most severe and intense of any headache type. Treatment for cluster headache includes prescription medication and oxygen. SINUS HEADACHES Q: What  is a sinus headache? What causes it? How can I treat it? A: When a cavity in the bones of the face and skull (a sinus) becomes inflamed, the inflammation will cause localized pain. This condition is usually the result of an allergic reaction, a tumor, or an infection. If your headache is caused by a sinus blockage, such as an infection, you will probably have a fever. An x-ray will confirm a sinus blockage. Your caregiver's treatment might include antibiotics for the  infection, as well as antihistamines or decongestants.  REBOUND HEADACHES Q: What is a rebound headache? What causes it? How can I treat it? A: A pattern of taking acute headache medications too often can lead to a condition known as "rebound headache." A pattern of taking too much headache medication includes taking it more than 2 days per week or in excessive amounts. That means more than the label or a caregiver advises. With rebound headaches, your medications not only stop relieving pain, they actually begin to cause headaches. Doctors treat rebound headache by tapering the medication that is being overused. Sometimes your caregiver will gradually substitute a different type of treatment or medication. Stopping may be a challenge. Regularly overusing a medication increases the potential for serious side effects. Consult a caregiver if you regularly use headache medications more than 2 days per week or more than the label advises. ADDITIONAL QUESTIONS AND ANSWERS Q: What is biofeedback? A: Biofeedback is a self-help treatment. Biofeedback uses special equipment to monitor your body's involuntary physical responses. Biofeedback monitors:  Breathing.  Pulse.  Heart rate.  Temperature.  Muscle tension.  Brain activity. Biofeedback helps you refine and perfect your relaxation exercises. You learn to control the physical responses that are related to stress. Once the technique has been mastered, you do not need the equipment any more. Q: Are headaches hereditary? A: Four out of five (80%) of people that suffer report a family history of migraine. Scientists are not sure if this is genetic or a family predisposition. Despite the uncertainty, a child has a 50% chance of having migraine if one parent suffers. The child has a 75% chance if both parents suffer.  Q: Can children get headaches? A: By the time they reach high school, most young people have experienced some type of headache. Many safe  and effective approaches or medications can prevent a headache from occurring or stop it after it has begun.  Q: What type of doctor should I see to diagnose and treat my headache? A: Start with your primary caregiver. Discuss his or her experience and approach to headaches. Discuss methods of classification, diagnosis, and treatment. Your caregiver may decide to recommend you to a headache specialist, depending upon your symptoms or other physical conditions. Having diabetes, allergies, etc., may require a more comprehensive and inclusive approach to your headache. The [employer] will provide, upon request, a list of [facility] physician members in your state. Document Released: 01/29/2004 Document Revised: 01/31/2012 Document Reviewed: 07/08/2008 [facility] Patient Information 2015 [place], [place]. This information is not intended to replace advice given to you by your health care provider. Make sure you discuss any questions you have with your health care provider.

## 2015-08-27 DIAGNOSIS — G43909 Migraine, unspecified, not intractable, without status migrainosus: Secondary | ICD-10-CM | POA: Diagnosis not present

## 2015-09-03 DIAGNOSIS — H8110 Benign paroxysmal vertigo, unspecified ear: Secondary | ICD-10-CM | POA: Diagnosis not present

## 2015-09-04 DIAGNOSIS — H838X9 Other specified diseases of inner ear, unspecified ear: Secondary | ICD-10-CM | POA: Diagnosis not present

## 2015-09-04 DIAGNOSIS — R42 Dizziness and giddiness: Secondary | ICD-10-CM | POA: Diagnosis not present

## 2015-09-08 ENCOUNTER — Other Ambulatory Visit: Payer: Self-pay

## 2015-09-08 DIAGNOSIS — Z1231 Encounter for screening mammogram for malignant neoplasm of breast: Secondary | ICD-10-CM

## 2015-09-17 DIAGNOSIS — M25512 Pain in left shoulder: Secondary | ICD-10-CM | POA: Diagnosis not present

## 2015-09-17 DIAGNOSIS — M47812 Spondylosis without myelopathy or radiculopathy, cervical region: Secondary | ICD-10-CM | POA: Diagnosis not present

## 2015-09-17 DIAGNOSIS — Z79891 Long term (current) use of opiate analgesic: Secondary | ICD-10-CM | POA: Diagnosis not present

## 2015-09-17 DIAGNOSIS — G894 Chronic pain syndrome: Secondary | ICD-10-CM | POA: Diagnosis not present

## 2015-10-09 DIAGNOSIS — H832X2 Labyrinthine dysfunction, left ear: Secondary | ICD-10-CM | POA: Diagnosis not present

## 2015-10-09 DIAGNOSIS — H5509 Other forms of nystagmus: Secondary | ICD-10-CM | POA: Diagnosis not present

## 2015-10-09 DIAGNOSIS — H04122 Dry eye syndrome of left lacrimal gland: Secondary | ICD-10-CM | POA: Diagnosis not present

## 2015-10-10 DIAGNOSIS — M5416 Radiculopathy, lumbar region: Secondary | ICD-10-CM | POA: Diagnosis not present

## 2015-11-12 DIAGNOSIS — H35372 Puckering of macula, left eye: Secondary | ICD-10-CM | POA: Diagnosis not present

## 2015-11-12 DIAGNOSIS — D3132 Benign neoplasm of left choroid: Secondary | ICD-10-CM | POA: Diagnosis not present

## 2015-11-12 DIAGNOSIS — H5315 Visual distortions of shape and size: Secondary | ICD-10-CM | POA: Diagnosis not present

## 2015-11-12 DIAGNOSIS — H5310 Unspecified subjective visual disturbances: Secondary | ICD-10-CM | POA: Diagnosis not present

## 2015-11-21 ENCOUNTER — Ambulatory Visit
Admission: RE | Admit: 2015-11-21 | Discharge: 2015-11-21 | Disposition: A | Discharge status: Home / Self Care | Payer: Medicare Other | Source: Ambulatory Visit

## 2015-11-21 DIAGNOSIS — Z1231 Encounter for screening mammogram for malignant neoplasm of breast: Secondary | ICD-10-CM

## 2015-11-23 HISTORY — PX: SPINAL FUSION: SHX223

## 2015-12-16 DIAGNOSIS — I10 Essential (primary) hypertension: Secondary | ICD-10-CM | POA: Diagnosis not present

## 2015-12-16 DIAGNOSIS — H35372 Puckering of macula, left eye: Secondary | ICD-10-CM | POA: Insufficient documentation

## 2015-12-16 DIAGNOSIS — D3132 Benign neoplasm of left choroid: Secondary | ICD-10-CM | POA: Diagnosis not present

## 2015-12-16 DIAGNOSIS — H353132 Nonexudative age-related macular degeneration, bilateral, intermediate dry stage: Secondary | ICD-10-CM | POA: Diagnosis not present

## 2015-12-16 DIAGNOSIS — E039 Hypothyroidism, unspecified: Secondary | ICD-10-CM | POA: Diagnosis not present

## 2015-12-16 DIAGNOSIS — H35363 Drusen (degenerative) of macula, bilateral: Secondary | ICD-10-CM | POA: Diagnosis not present

## 2015-12-16 DIAGNOSIS — Z886 Allergy status to analgesic agent status: Secondary | ICD-10-CM | POA: Diagnosis not present

## 2015-12-16 DIAGNOSIS — Z882 Allergy status to sulfonamides status: Secondary | ICD-10-CM | POA: Diagnosis not present

## 2015-12-16 DIAGNOSIS — Z888 Allergy status to other drugs, medicaments and biological substances status: Secondary | ICD-10-CM | POA: Diagnosis not present

## 2015-12-16 DIAGNOSIS — Z961 Presence of intraocular lens: Secondary | ICD-10-CM | POA: Insufficient documentation

## 2015-12-16 DIAGNOSIS — Z881 Allergy status to other antibiotic agents status: Secondary | ICD-10-CM | POA: Diagnosis not present

## 2015-12-16 DIAGNOSIS — Z79899 Other long term (current) drug therapy: Secondary | ICD-10-CM | POA: Diagnosis not present

## 2016-01-06 DIAGNOSIS — M5412 Radiculopathy, cervical region: Secondary | ICD-10-CM | POA: Diagnosis not present

## 2016-01-06 DIAGNOSIS — M4806 Spinal stenosis, lumbar region: Secondary | ICD-10-CM | POA: Diagnosis not present

## 2016-01-06 DIAGNOSIS — M542 Cervicalgia: Secondary | ICD-10-CM | POA: Diagnosis not present

## 2016-01-07 DIAGNOSIS — M4310 Spondylolisthesis, site unspecified: Secondary | ICD-10-CM | POA: Diagnosis not present

## 2016-01-07 DIAGNOSIS — M5136 Other intervertebral disc degeneration, lumbar region: Secondary | ICD-10-CM | POA: Diagnosis not present

## 2016-01-07 DIAGNOSIS — M4806 Spinal stenosis, lumbar region: Secondary | ICD-10-CM | POA: Diagnosis not present

## 2016-01-07 IMAGING — RF DG ESOPHAGUS
11 of 14 series · 15 of 24 positions shown · non-contrast
Comparison: None.

CLINICAL DATA: Dysphagia.

EXAM:
ESOPHOGRAM / BARIUM SWALLOW / BARIUM TABLET STUDY
TECHNIQUE: Combined double contrast and single contrast examination performed
using effervescent crystals, thick barium liquid, and thin barium
liquid. The patient was observed with fluoroscopy swallowing a 13mm
barium sulphate tablet.
FLUOROSCOPY TIME:  1 min 0 seconds

[Series 1: run · 2 of 21 slices shown (1 of 11)]
[im 1/21]
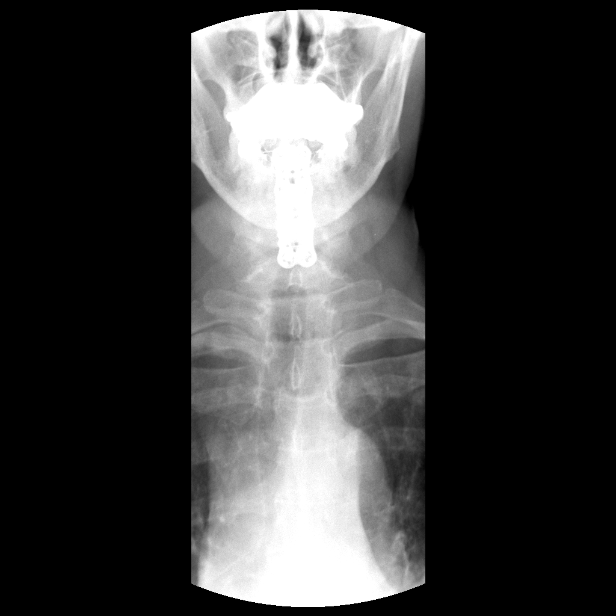
[im 14/21]
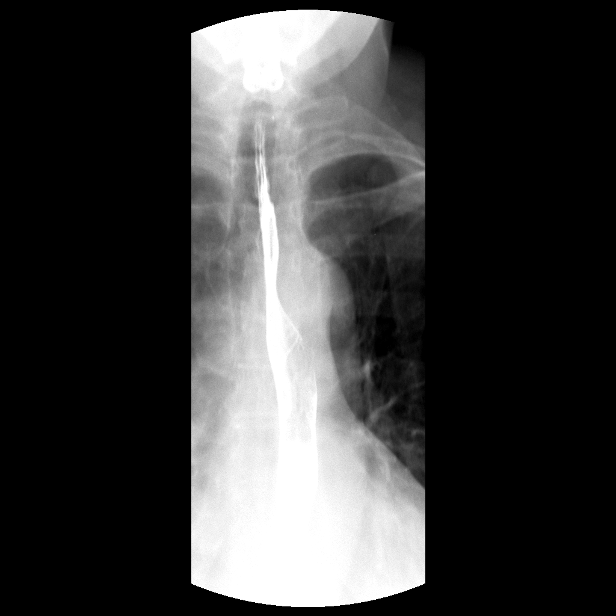

[Series 2: run · 1 of 3 slices shown (2 of 11)]
[im 1/3]
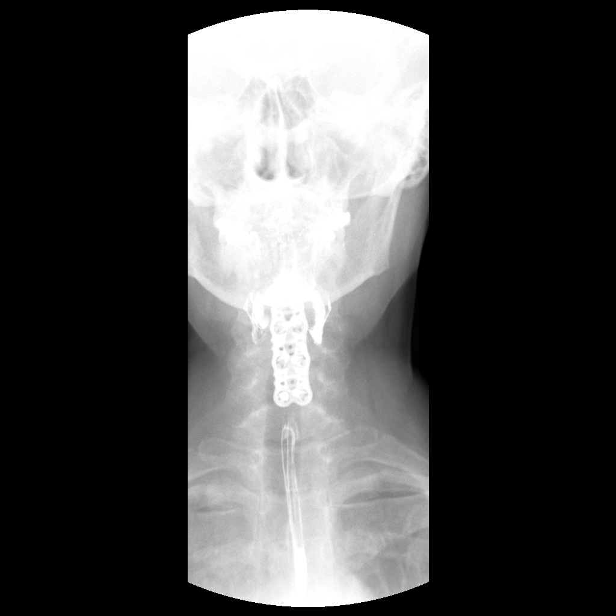

[Series 3: run · 1 of 10 slices shown (3 of 11)]
[im 1/10]
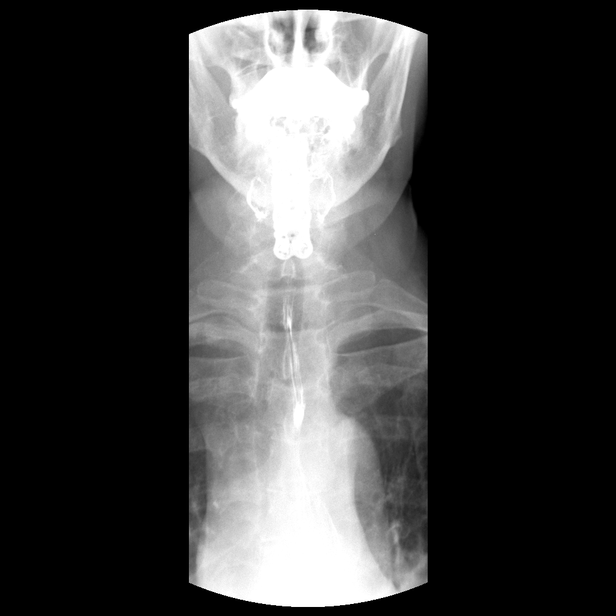

[Series 4: run · 3 of 22 slices shown (4 of 11)]
[im 5/22]
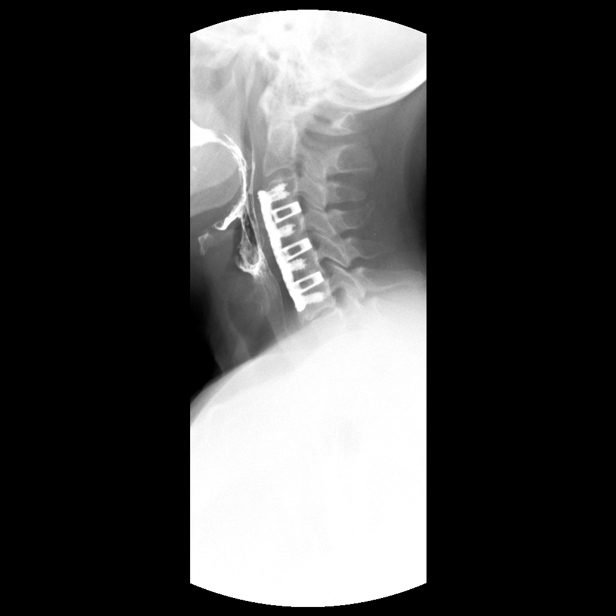
[im 9/22]
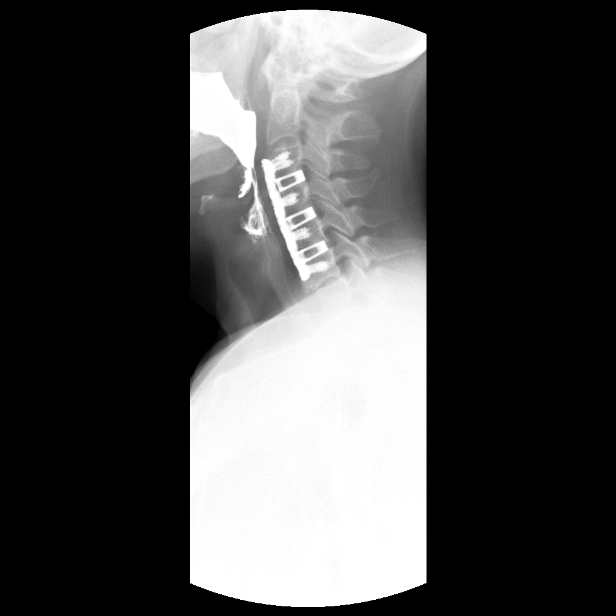
[im 17/22]
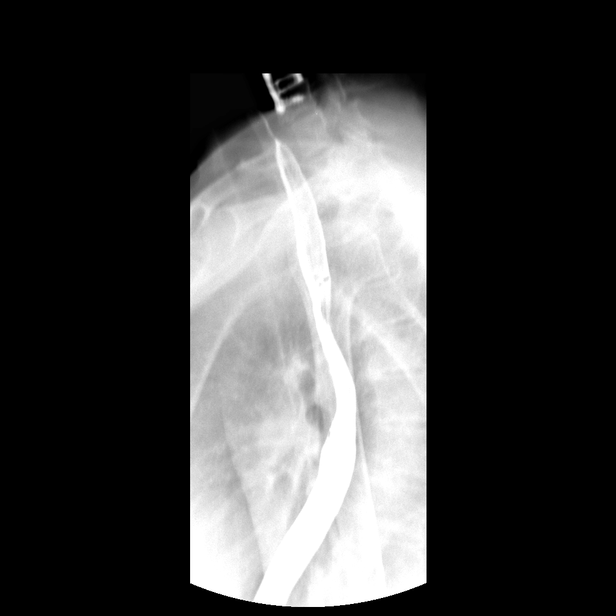

[Series 5: run · 1 of 1 slices shown (5 of 11)]
[im 1/1]
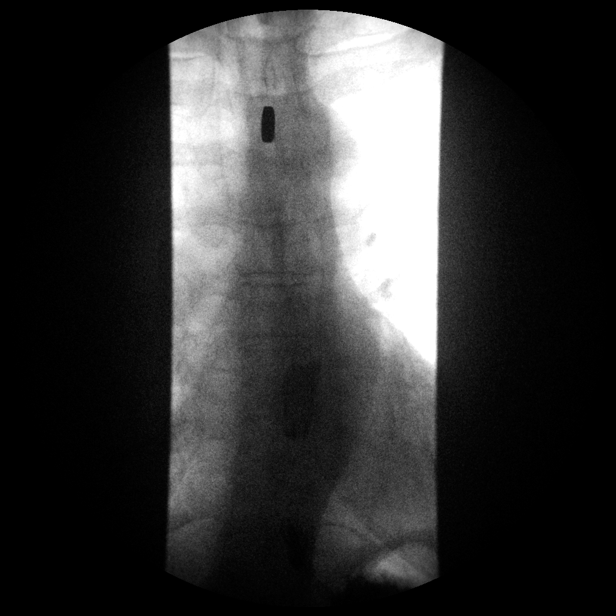

[Series 6: run · 1 of 1 slices shown (6 of 11)]
[im 1/1]
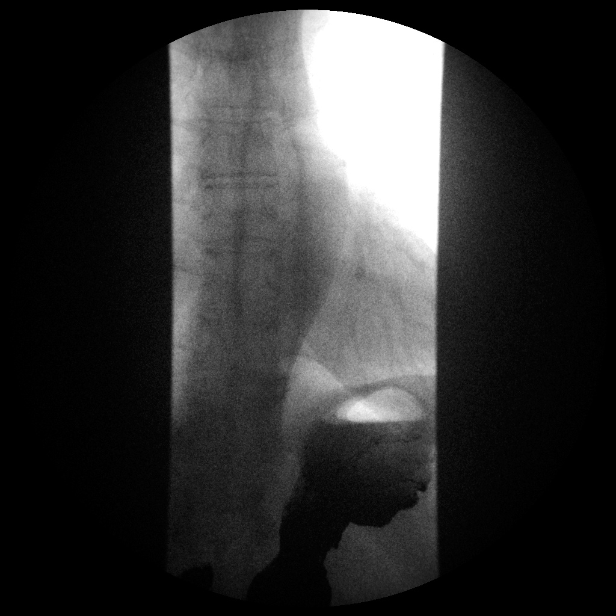

[Series 8: run · 1 of 1 slices shown (7 of 11)]
[im 1/1]
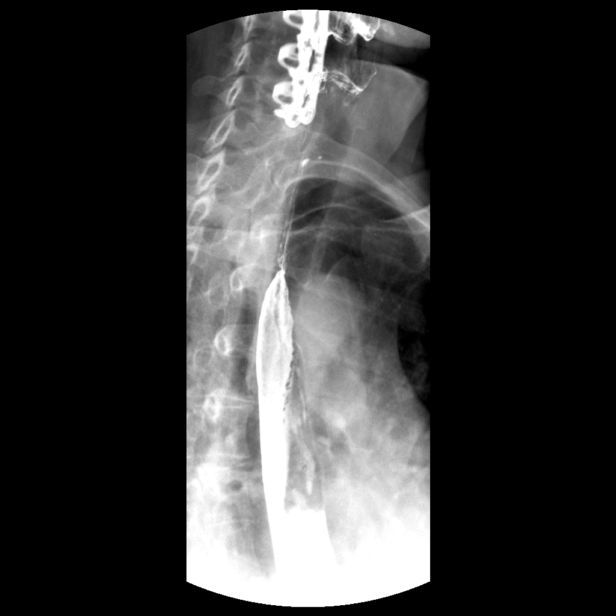

[Series 9: run · 1 of 1 slices shown (8 of 11)]
[im 1/1]
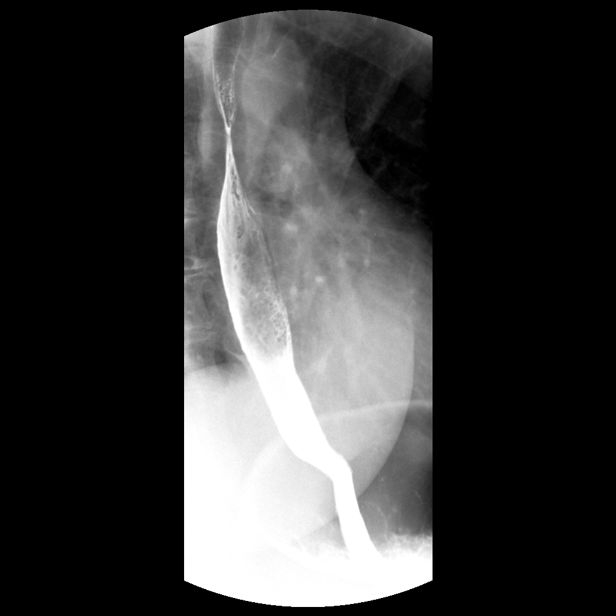

[Series 11: run · 1 of 1 slices shown (9 of 11)]
[im 1/1]
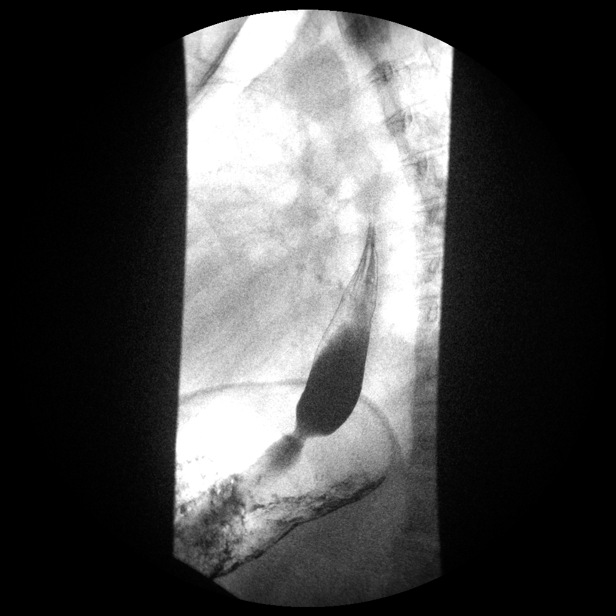

[Series 13: run · 1 of 1 slices shown (10 of 11)]
[im 1/1]
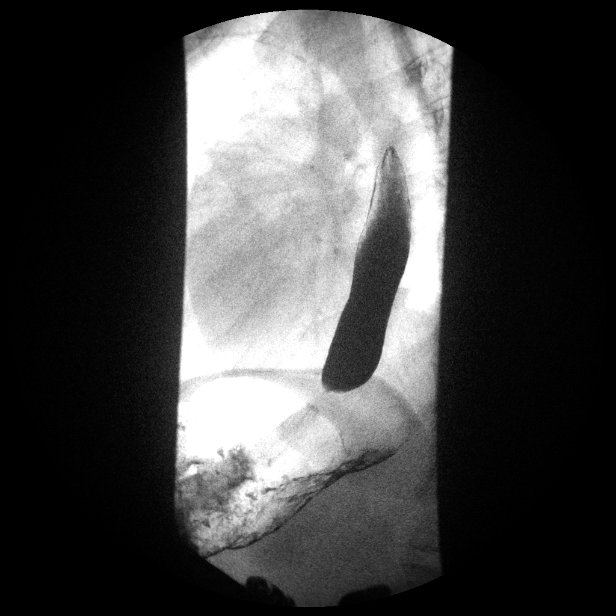

[Series 14: run · 2 of 13 slices shown (11 of 11)]
[im 1/13]
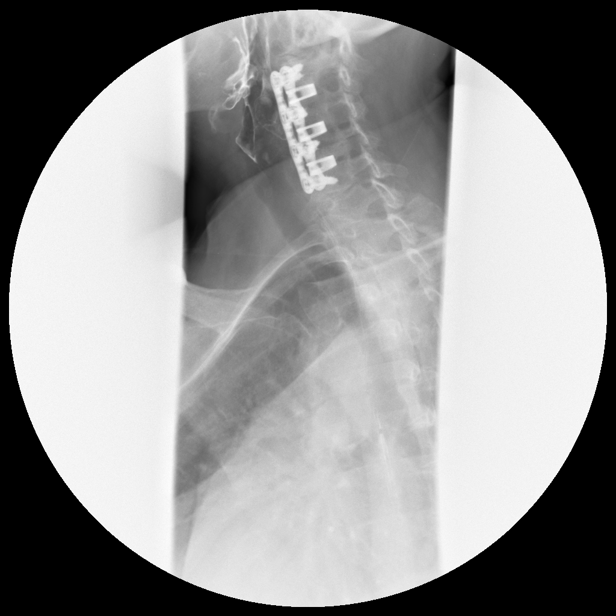
[im 13/13]
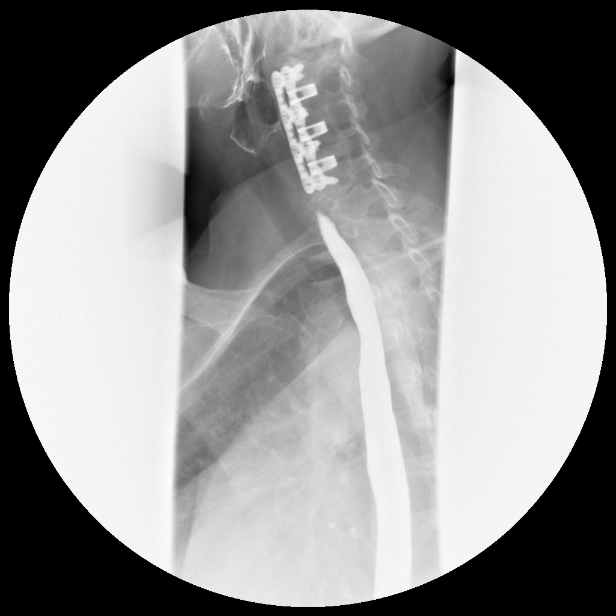

[15 of 24 positions shown; findings below may reference images not displayed]

FINDINGS: The oropharyngeal swallowing mechanisms are normal. Valleculae and
piriform sinuses are normal. Prominent cricopharyngeus muscle but
the muscle relaxes completely.

The mucosa and motility of the esophagus are normal. No hiatal
hernia. A 13 mm barium tablet passes without delay. No prevertebral
soft tissue swelling at the site of the patient's previous anterior
cervical fusions.
IMPRESSION: Normal barium esophagram.

## 2016-01-12 DIAGNOSIS — N3000 Acute cystitis without hematuria: Secondary | ICD-10-CM | POA: Diagnosis not present

## 2016-01-12 DIAGNOSIS — Z0000 Encounter for general adult medical examination without abnormal findings: Secondary | ICD-10-CM | POA: Diagnosis not present

## 2016-01-14 DIAGNOSIS — M25512 Pain in left shoulder: Secondary | ICD-10-CM | POA: Diagnosis not present

## 2016-01-14 DIAGNOSIS — G894 Chronic pain syndrome: Secondary | ICD-10-CM | POA: Diagnosis not present

## 2016-01-14 DIAGNOSIS — M47812 Spondylosis without myelopathy or radiculopathy, cervical region: Secondary | ICD-10-CM | POA: Diagnosis not present

## 2016-01-14 DIAGNOSIS — Z79891 Long term (current) use of opiate analgesic: Secondary | ICD-10-CM | POA: Diagnosis not present

## 2016-01-15 DIAGNOSIS — G43809 Other migraine, not intractable, without status migrainosus: Secondary | ICD-10-CM | POA: Diagnosis not present

## 2016-01-15 DIAGNOSIS — G43019 Migraine without aura, intractable, without status migrainosus: Secondary | ICD-10-CM | POA: Diagnosis not present

## 2016-01-15 DIAGNOSIS — G43719 Chronic migraine without aura, intractable, without status migrainosus: Secondary | ICD-10-CM | POA: Diagnosis not present

## 2016-01-16 DIAGNOSIS — J3081 Allergic rhinitis due to animal (cat) (dog) hair and dander: Secondary | ICD-10-CM | POA: Diagnosis not present

## 2016-01-16 DIAGNOSIS — H9071 Mixed conductive and sensorineural hearing loss, unilateral, right ear, with unrestricted hearing on the contralateral side: Secondary | ICD-10-CM | POA: Diagnosis not present

## 2016-01-16 DIAGNOSIS — R2689 Other abnormalities of gait and mobility: Secondary | ICD-10-CM | POA: Diagnosis not present

## 2016-01-16 DIAGNOSIS — H8101 Meniere's disease, right ear: Secondary | ICD-10-CM | POA: Diagnosis not present

## 2016-01-16 DIAGNOSIS — H9311 Tinnitus, right ear: Secondary | ICD-10-CM | POA: Diagnosis not present

## 2016-02-05 DIAGNOSIS — J309 Allergic rhinitis, unspecified: Secondary | ICD-10-CM | POA: Diagnosis not present

## 2016-02-05 DIAGNOSIS — Z9889 Other specified postprocedural states: Secondary | ICD-10-CM | POA: Diagnosis not present

## 2016-02-05 DIAGNOSIS — Z79899 Other long term (current) drug therapy: Secondary | ICD-10-CM | POA: Diagnosis not present

## 2016-02-05 DIAGNOSIS — K219 Gastro-esophageal reflux disease without esophagitis: Secondary | ICD-10-CM | POA: Diagnosis not present

## 2016-02-05 DIAGNOSIS — E039 Hypothyroidism, unspecified: Secondary | ICD-10-CM | POA: Diagnosis not present

## 2016-02-05 DIAGNOSIS — Z882 Allergy status to sulfonamides status: Secondary | ICD-10-CM | POA: Diagnosis not present

## 2016-02-05 DIAGNOSIS — J328 Other chronic sinusitis: Secondary | ICD-10-CM | POA: Diagnosis not present

## 2016-02-05 DIAGNOSIS — J329 Chronic sinusitis, unspecified: Secondary | ICD-10-CM | POA: Diagnosis not present

## 2016-02-05 DIAGNOSIS — Z881 Allergy status to other antibiotic agents status: Secondary | ICD-10-CM | POA: Diagnosis not present

## 2016-02-05 DIAGNOSIS — Z888 Allergy status to other drugs, medicaments and biological substances status: Secondary | ICD-10-CM | POA: Diagnosis not present

## 2016-02-05 DIAGNOSIS — I10 Essential (primary) hypertension: Secondary | ICD-10-CM | POA: Diagnosis not present

## 2016-02-05 DIAGNOSIS — Z886 Allergy status to analgesic agent status: Secondary | ICD-10-CM | POA: Diagnosis not present

## 2016-02-05 DIAGNOSIS — J387 Other diseases of larynx: Secondary | ICD-10-CM | POA: Diagnosis not present

## 2016-02-05 DIAGNOSIS — Z7951 Long term (current) use of inhaled steroids: Secondary | ICD-10-CM | POA: Diagnosis not present

## 2016-02-05 DIAGNOSIS — M4806 Spinal stenosis, lumbar region: Secondary | ICD-10-CM | POA: Diagnosis not present

## 2016-02-05 DIAGNOSIS — Z9089 Acquired absence of other organs: Secondary | ICD-10-CM | POA: Diagnosis not present

## 2016-02-06 DIAGNOSIS — H401111 Primary open-angle glaucoma, right eye, mild stage: Secondary | ICD-10-CM | POA: Diagnosis not present

## 2016-02-06 DIAGNOSIS — H401122 Primary open-angle glaucoma, left eye, moderate stage: Secondary | ICD-10-CM | POA: Diagnosis not present

## 2016-02-06 DIAGNOSIS — H353121 Nonexudative age-related macular degeneration, left eye, early dry stage: Secondary | ICD-10-CM | POA: Diagnosis not present

## 2016-02-06 DIAGNOSIS — H353111 Nonexudative age-related macular degeneration, right eye, early dry stage: Secondary | ICD-10-CM | POA: Diagnosis not present

## 2016-02-10 DIAGNOSIS — M4806 Spinal stenosis, lumbar region: Secondary | ICD-10-CM | POA: Diagnosis not present

## 2016-02-12 DIAGNOSIS — M4806 Spinal stenosis, lumbar region: Secondary | ICD-10-CM | POA: Diagnosis not present

## 2016-02-23 DIAGNOSIS — M4806 Spinal stenosis, lumbar region: Secondary | ICD-10-CM | POA: Diagnosis not present

## 2016-03-17 DIAGNOSIS — M4806 Spinal stenosis, lumbar region: Secondary | ICD-10-CM | POA: Diagnosis not present

## 2016-03-17 DIAGNOSIS — Z01818 Encounter for other preprocedural examination: Secondary | ICD-10-CM | POA: Diagnosis not present

## 2016-03-18 DIAGNOSIS — Z961 Presence of intraocular lens: Secondary | ICD-10-CM | POA: Diagnosis not present

## 2016-03-18 DIAGNOSIS — D3132 Benign neoplasm of left choroid: Secondary | ICD-10-CM | POA: Diagnosis not present

## 2016-03-18 DIAGNOSIS — H35372 Puckering of macula, left eye: Secondary | ICD-10-CM | POA: Diagnosis not present

## 2016-03-18 DIAGNOSIS — H353132 Nonexudative age-related macular degeneration, bilateral, intermediate dry stage: Secondary | ICD-10-CM | POA: Diagnosis not present

## 2016-03-25 DIAGNOSIS — H3530 Unspecified macular degeneration: Secondary | ICD-10-CM | POA: Diagnosis present

## 2016-03-25 DIAGNOSIS — Z7951 Long term (current) use of inhaled steroids: Secondary | ICD-10-CM | POA: Diagnosis not present

## 2016-03-25 DIAGNOSIS — M5010 Cervical disc disorder with radiculopathy, unspecified cervical region: Secondary | ICD-10-CM | POA: Diagnosis present

## 2016-03-25 DIAGNOSIS — M4327 Fusion of spine, lumbosacral region: Secondary | ICD-10-CM | POA: Insufficient documentation

## 2016-03-25 DIAGNOSIS — Z882 Allergy status to sulfonamides status: Secondary | ICD-10-CM | POA: Diagnosis not present

## 2016-03-25 DIAGNOSIS — K219 Gastro-esophageal reflux disease without esophagitis: Secondary | ICD-10-CM | POA: Diagnosis present

## 2016-03-25 DIAGNOSIS — Z90710 Acquired absence of both cervix and uterus: Secondary | ICD-10-CM | POA: Diagnosis not present

## 2016-03-25 DIAGNOSIS — E039 Hypothyroidism, unspecified: Secondary | ICD-10-CM | POA: Diagnosis present

## 2016-03-25 DIAGNOSIS — M4317 Spondylolisthesis, lumbosacral region: Secondary | ICD-10-CM | POA: Diagnosis present

## 2016-03-25 DIAGNOSIS — Z9049 Acquired absence of other specified parts of digestive tract: Secondary | ICD-10-CM | POA: Diagnosis not present

## 2016-03-25 DIAGNOSIS — M4806 Spinal stenosis, lumbar region: Secondary | ICD-10-CM | POA: Diagnosis not present

## 2016-03-25 DIAGNOSIS — M4807 Spinal stenosis, lumbosacral region: Secondary | ICD-10-CM | POA: Diagnosis not present

## 2016-03-30 ENCOUNTER — Encounter (HOSPITAL_COMMUNITY): Payer: Self-pay | Admitting: *Deleted

## 2016-03-30 ENCOUNTER — Emergency Department (HOSPITAL_COMMUNITY)
Admission: EM | Admit: 2016-03-30 | Discharge: 2016-03-31 | Disposition: A | Discharge status: Home / Self Care | Payer: Medicare Other | Attending: Emergency Medicine | Admitting: Emergency Medicine

## 2016-03-30 ENCOUNTER — Emergency Department (HOSPITAL_COMMUNITY): Payer: Medicare Other

## 2016-03-30 DIAGNOSIS — K5900 Constipation, unspecified: Secondary | ICD-10-CM | POA: Diagnosis not present

## 2016-03-30 DIAGNOSIS — G43909 Migraine, unspecified, not intractable, without status migrainosus: Secondary | ICD-10-CM | POA: Diagnosis not present

## 2016-03-30 DIAGNOSIS — R1030 Lower abdominal pain, unspecified: Secondary | ICD-10-CM | POA: Diagnosis not present

## 2016-03-30 DIAGNOSIS — E039 Hypothyroidism, unspecified: Secondary | ICD-10-CM | POA: Diagnosis not present

## 2016-03-30 DIAGNOSIS — R188 Other ascites: Secondary | ICD-10-CM | POA: Diagnosis not present

## 2016-03-30 DIAGNOSIS — Z79899 Other long term (current) drug therapy: Secondary | ICD-10-CM | POA: Diagnosis not present

## 2016-03-30 DIAGNOSIS — K219 Gastro-esophageal reflux disease without esophagitis: Secondary | ICD-10-CM | POA: Insufficient documentation

## 2016-03-30 DIAGNOSIS — H409 Unspecified glaucoma: Secondary | ICD-10-CM | POA: Insufficient documentation

## 2016-03-30 DIAGNOSIS — G8918 Other acute postprocedural pain: Secondary | ICD-10-CM | POA: Diagnosis present

## 2016-03-30 DIAGNOSIS — Z9889 Other specified postprocedural states: Secondary | ICD-10-CM | POA: Diagnosis not present

## 2016-03-30 DIAGNOSIS — M545 Low back pain: Secondary | ICD-10-CM | POA: Diagnosis not present

## 2016-03-30 DIAGNOSIS — Z7951 Long term (current) use of inhaled steroids: Secondary | ICD-10-CM | POA: Diagnosis not present

## 2016-03-30 DIAGNOSIS — F329 Major depressive disorder, single episode, unspecified: Secondary | ICD-10-CM | POA: Insufficient documentation

## 2016-03-30 DIAGNOSIS — R509 Fever, unspecified: Secondary | ICD-10-CM | POA: Diagnosis not present

## 2016-03-30 DIAGNOSIS — E559 Vitamin D deficiency, unspecified: Secondary | ICD-10-CM | POA: Insufficient documentation

## 2016-03-30 LAB — CBC WITH DIFFERENTIAL/PLATELET
BASOS PCT: 0 %
Basophils Absolute: 0 10*3/uL (ref 0.0–0.1)
EOS ABS: 0.6 10*3/uL (ref 0.0–0.7)
Eosinophils Relative: 6 %
HCT: 36.1 % (ref 36.0–46.0)
HEMOGLOBIN: 12 g/dL (ref 12.0–15.0)
LYMPHS ABS: 1.8 10*3/uL (ref 0.7–4.0)
Lymphocytes Relative: 18 %
MCH: 30 pg (ref 26.0–34.0)
MCHC: 33.2 g/dL (ref 30.0–36.0)
MCV: 90.3 fL (ref 78.0–100.0)
Monocytes Absolute: 0.8 10*3/uL (ref 0.1–1.0)
Monocytes Relative: 9 %
NEUTROS ABS: 6.4 10*3/uL (ref 1.7–7.7)
NEUTROS PCT: 67 %
Platelets: 269 10*3/uL (ref 150–400)
RBC: 4 MIL/uL (ref 3.87–5.11)
RDW: 13.2 % (ref 11.5–15.5)
WBC: 9.6 10*3/uL (ref 4.0–10.5)

## 2016-03-30 LAB — COMPREHENSIVE METABOLIC PANEL
ALT: 30 U/L (ref 14–54)
ANION GAP: 8 (ref 5–15)
AST: 42 U/L — ABNORMAL HIGH (ref 15–41)
Albumin: 3.4 g/dL — ABNORMAL LOW (ref 3.5–5.0)
Alkaline Phosphatase: 104 U/L (ref 38–126)
BUN: 13 mg/dL (ref 6–20)
CHLORIDE: 107 mmol/L (ref 101–111)
CO2: 24 mmol/L (ref 22–32)
CREATININE: 0.93 mg/dL (ref 0.44–1.00)
Calcium: 8.5 mg/dL — ABNORMAL LOW (ref 8.9–10.3)
Glucose, Bld: 117 mg/dL — ABNORMAL HIGH (ref 65–99)
POTASSIUM: 4.3 mmol/L (ref 3.5–5.1)
Sodium: 139 mmol/L (ref 135–145)
Total Bilirubin: 0.5 mg/dL (ref 0.3–1.2)
Total Protein: 5.9 g/dL — ABNORMAL LOW (ref 6.5–8.1)

## 2016-03-30 MED ORDER — ONDANSETRON HCL 4 MG/2ML IJ SOLN
4.0000 mg | Freq: Once | INTRAMUSCULAR | Status: AC
Start: 2016-03-30 — End: 2016-03-30
  Administered 2016-03-30: 4 mg via INTRAVENOUS
  Filled 2016-03-30: qty 2

## 2016-03-30 MED ORDER — MORPHINE SULFATE (PF) 4 MG/ML IV SOLN
4.0000 mg | Freq: Once | INTRAVENOUS | Status: AC
  Administered 2016-03-30: 4 mg via INTRAVENOUS
  Filled 2016-03-30: qty 1

## 2016-03-30 MED ORDER — SODIUM CHLORIDE 0.9 % IV BOLUS (SEPSIS)
500.0000 mL | Freq: Once | INTRAVENOUS | Status: AC
  Administered 2016-03-30: 500 mL via INTRAVENOUS

## 2016-03-30 NOTE — ED Provider Notes (Signed)
CSN: [id number]     Arrival date & time 03/30/16  2056 History  By signing my name below, I, Soijett Blue, attest that this documentation has been prepared under the direction and in the presence of Charlann Lange, PA-C Electronically Signed: Soijett Blue, ED Scribe. 03/30/2016. 9:50 PM.   Chief Complaint  Patient presents with  . Post-op Problem     The history is provided by the patient. No language interpreter was used.    HPI Comments: April Berg is a 70 y.o. female who presents to the Emergency Department complaining of post-op problem onset 5 PM. Pt notes that she had lumbar spinal fusion surgery completed by orthopedist Dr. Martyn Ehrich at [facility] 5 days ago and was discharged home 2 days ago. Pt reports that her back pain was controlled with pain medications until tonight when her back pain increased. Pt reports that she has been able to lay on her back since the surgery. She states that she is having associated symptoms of increasing abdominal pain, lower back pain, low grade fever, constipation x 6 days, and chills. Pt notes that she is able to pass gas. She states that she has tried glycerin supplement, dulcolax, milk of magnesia, and sennacot with no relief for her symptoms. She denies gait problem, vomiting, nausea, difficulty urinating, bladder incontinence, dysuria, appetite change, and any other symptoms. Pt states that she has had a C-section and abdominal hysterectomy in the past.   Past Medical History  Diagnosis Date  . Complication of anesthesia 90's    block for elbow surgery and pt had a seizure... surgery since with no problem   . Hypothyroidism   . GERD (gastroesophageal reflux disease)   . Arthritis     osteoarthritis  . Spinal stenosis   . History of pericarditis     DEC 2008 AND SMALL PERICARDIAL EFFUSION--   RESOLVED  . Migraines   . Pelvic pain   . Vitamin D deficiency   . H/O hiatal hernia   . IBS (irritable bowel syndrome)   . Glaucoma of both  eyes   . Hyperlipidemia   . Depression    Past Surgical History  Procedure Laterality Date  . Anterior cervical decomp/discectomy fusion  12/08/2011    Procedure: ANTERIOR CERVICAL DECOMPRESSION/DISCECTOMY FUSION 3 LEVELS;  Surgeon: Sinclair Ship, MD;  Location: [place];  Service: Orthopedics;  Laterality: N/A;  C 3-6 ACDF  . Cardiac catheterization  10-27-2007  DR BRUCE BRODIE    NORMAL CORONARY ARTERIES/ NORMAL LVF  . Transthoracic echocardiogram  03-04-2010    MODERATE LVH/ NORMAL LVSF/ MILD DIASTOLIC DYSFUNCTION/ EF [id number] MILD AI  . Cardiovascular stress test  02-25-2010  DR CRENSHAW    ANTERIOR ATTENUATION NO SCAR OR ISCHEMIA/ EF 81%  . Tonsillectomy  1951  . Cesarean section  1975  . Elbow tendon release Left 1996  . Thumb tendon transposition Right 2012  . Glaucoma surgery Bilateral 2004  . Cataract extraction w/ intraocular lens  implant, bilateral    . Rhinoplasty  2010  . Total abdominal hysterectomy w/ bilateral salpingoophorectomy  1982  . Cysto with hydrodistension N/A 06/14/2013    Procedure: CYSTOSCOPY/HYDRODISTENSION/INSTILLATION OF MARCAINE AND PYRIDIUM;  Surgeon: Reece Packer, MD;  Location: [place];  Service: Urology;  Laterality: N/A;   Family History  Problem Relation Age of Onset  . Pulmonary fibrosis Mother   . Lung disease Father    Social History  Substance Use Topics  . Smoking status: Never  Smoker   . Smokeless tobacco: Never Used  . Alcohol Use: 1.2 oz/week    2 Glasses of wine per week   OB History    No data available     Review of Systems  Constitutional: Positive for fever and chills. Negative for appetite change.  Gastrointestinal: Positive for abdominal pain and constipation. Negative for nausea and vomiting.  Genitourinary: Negative for dysuria and difficulty urinating.       No bladder incontinence  Musculoskeletal: Positive for back pain. Negative for gait problem.     Allergies  Adhesive; Aspirin;  Avelox; Chlorthalidone; Hydrochlorothiazide; Neomycin; Nsaids; and Sulfa antibiotics  Home Medications   Prior to Admission medications   Medication Sig Start Date End Date Taking? Authorizing Provider  Calcium Carbonate (CALCIUM 600 PO) Take 1,200 mg by mouth daily.    Historical Provider, MD  cyclobenzaprine (FLEXERIL) 10 MG tablet Take 10 mg by mouth 3 (three) times daily as needed for muscle spasms (muscle spasms).     Historical Provider, MD  dexlansoprazole (DEXILANT) 60 MG capsule Take 60 mg by mouth daily.    Historical Provider, MD  escitalopram (LEXAPRO) 10 MG tablet Take 1 tablet by mouth daily. 08/18/15   Historical Provider, MD  fexofenadine (ALLEGRA) 180 MG tablet Take 180 mg by mouth daily.    Historical Provider, MD  fluticasone (FLONASE) 50 MCG/ACT nasal spray Place 1 spray into both nostrils daily.     Historical Provider, MD  levothyroxine (SYNTHROID, LEVOTHROID) 125 MCG tablet Take 125 mcg by mouth daily before breakfast.    Historical Provider, MD  LORazepam (ATIVAN) 1 MG tablet Take 1 tablet (1 mg total) by mouth every 8 (eight) hours as needed for anxiety (vertigo). 01/30/15   Marton Redwood, MD  meclizine (ANTIVERT) 50 MG tablet Take 1 tablet (50 mg total) by mouth 3 (three) times daily as needed. 08/26/15   Fransico Meadow, PA-C  Multiple Vitamins-Minerals (MULTIVITAMINS THER. W/MINERALS) TABS Take 1 tablet by mouth daily.    Historical Provider, MD  ondansetron (ZOFRAN ODT) 4 MG disintegrating tablet Take 1 tablet (4 mg total) by mouth every 8 (eight) hours as needed for nausea or vomiting. 08/26/15   Fransico Meadow, PA-C  ondansetron (ZOFRAN) 4 MG tablet Take 1 tablet (4 mg total) by mouth every 8 (eight) hours as needed for nausea or vomiting. 01/30/15   Marton Redwood, MD  SUMAtriptan (IMITREX) 100 MG tablet Take 1 tablet (100 mg total) by mouth every 2 (two) hours as needed for migraine. May repeat in 2 hours if headache persists or recurs. 08/26/15   Fransico Meadow, PA-C   telmisartan (MICARDIS) 80 MG tablet Take 1 tablet by mouth daily. 08/20/15   Historical Provider, MD  timolol (TIMOPTIC) 0.5 % ophthalmic solution Place 1 drop into both eyes at bedtime.  07/05/14   Historical Provider, MD  topiramate (TOPAMAX) 100 MG tablet Take 100 mg by mouth every evening.    Historical Provider, MD  Vitamin D, Ergocalciferol, (DRISDOL) 50000 UNITS CAPS Take 50,000 Units by mouth every 7 (seven) days. On Wednesdays.    Historical Provider, MD   BP 147/85 mmHg  Pulse 92  Temp(Src) 97.9 F (36.6 C) (Oral)  Resp 20  SpO2 100% Physical Exam  Constitutional: She is oriented to person, place, and time. She appears well-developed and well-nourished. No distress.  HENT:  Head: Normocephalic and atraumatic.  Eyes: EOM are normal.  Neck: Neck supple.  Cardiovascular: Normal rate.   Pulmonary/Chest: Effort normal. No respiratory  distress.  Abdominal: Soft. Bowel sounds are normal. She exhibits distension.  Lower abdominal midline incision without wound dehisces, drainage, or redness. Tender in the lower midline otherwise no abdominal tenderness. Moderate distension. Soft. BS active.   Genitourinary: Rectal exam shows anal tone normal.  Good rectal tone. No palpable impaction.   Musculoskeletal: Normal range of motion.  Two surgical incisions, one on each side of lumbar region. No wound dehisces, redness, or drainage. Moderate direct tenderness otherwise no tenderness to lower back.   Neurological: She is alert and oriented to person, place, and time.  Reflexes in LE are equal bilaterally.   Skin: Skin is warm and dry.  Psychiatric: She has a normal mood and affect. Her behavior is normal.  Nursing note and vitals reviewed.   ED Course  Procedures (including critical care time) DIAGNOSTIC STUDIES: Oxygen Saturation is 100% on RA, nl by my interpretation.    COORDINATION OF CARE: 9:49 PM Discussed treatment plan with pt at bedside which includes labs and pt agreed to  plan.    Labs Review Labs Reviewed  COMPREHENSIVE METABOLIC PANEL - Abnormal; Notable for the following:    Glucose, Bld 117 (*)    Calcium 8.5 (*)    Total Protein 5.9 (*)    Albumin 3.4 (*)    AST 42 (*)    All other components within normal limits  CBC WITH DIFFERENTIAL/PLATELET  URINALYSIS, ROUTINE W REFLEX MICROSCOPIC (NOT AT [facility])   Results for orders placed or performed during the hospital encounter of 03/30/16  Comprehensive metabolic panel  Result Value Ref Range   Sodium 139 135 - 145 mmol/L   Potassium 4.3 3.5 - 5.1 mmol/L   Chloride 107 101 - 111 mmol/L   CO2 24 22 - 32 mmol/L   Glucose, Bld 117 (H) 65 - 99 mg/dL   BUN 13 6 - 20 mg/dL   Creatinine, Ser 0.93 0.44 - 1.00 mg/dL   Calcium 8.5 (L) 8.9 - 10.3 mg/dL   Total Protein 5.9 (L) 6.5 - 8.1 g/dL   Albumin 3.4 (L) 3.5 - 5.0 g/dL   AST 42 (H) 15 - 41 U/L   ALT 30 14 - 54 U/L   Alkaline Phosphatase 104 38 - 126 U/L   Total Bilirubin 0.5 0.3 - 1.2 mg/dL   GFR calc non Af Amer >60 >60 mL/min   GFR calc Af Amer >60 >60 mL/min   Anion gap 8 5 - 15  CBC with Differential  Result Value Ref Range   WBC 9.6 4.0 - 10.5 K/uL   RBC 4.00 3.87 - 5.11 MIL/uL   Hemoglobin 12.0 12.0 - 15.0 g/dL   HCT 36.1 36.0 - 46.0 %   MCV 90.3 78.0 - 100.0 fL   MCH 30.0 26.0 - 34.0 pg   MCHC 33.2 30.0 - 36.0 g/dL   RDW 13.2 11.5 - 15.5 %   Platelets 269 150 - 400 K/uL   Neutrophils Relative % 67 %   Neutro Abs 6.4 1.7 - 7.7 K/uL   Lymphocytes Relative 18 %   Lymphs Abs 1.8 0.7 - 4.0 K/uL   Monocytes Relative 9 %   Monocytes Absolute 0.8 0.1 - 1.0 K/uL   Eosinophils Relative 6 %   Eosinophils Absolute 0.6 0.0 - 0.7 K/uL   Basophils Relative 0 %   Basophils Absolute 0 0.0 - 0.1 K/uL  Urinalysis, Routine w reflex microscopic  Result Value Ref Range   Color, Urine YELLOW YELLOW   APPearance CLEAR CLEAR  Specific Gravity, Urine 1.010 1.005 - 1.030   pH 8.0 5.0 - 8.0   Glucose, UA NEGATIVE NEGATIVE mg/dL   Hgb urine dipstick  NEGATIVE NEGATIVE   Bilirubin Urine NEGATIVE NEGATIVE   Ketones, ur NEGATIVE NEGATIVE mg/dL   Protein, ur NEGATIVE NEGATIVE mg/dL   Nitrite NEGATIVE NEGATIVE   Leukocytes, UA TRACE (A) NEGATIVE  Urine microscopic-add on  Result Value Ref Range   Squamous Epithelial / LPF 0-5 (A) NONE SEEN   WBC, UA 0-5 0 - 5 WBC/hpf   RBC / HPF 0-5 0 - 5 RBC/hpf   Bacteria, UA RARE (A) NONE SEEN    Imaging Review No results found. I have personally reviewed and evaluated these lab results as part of my medical decision-making.   EKG Interpretation None      MDM   Final diagnoses:  None    1. Abdominal pain 2. Constipation 3. Post-operative pain  Patient presents with husband (retired physician) with concern for increased pain yesterday and today in abdomen with distention and constipation. She is 2-3 days post lumbar fusion surgery ([facility], Dr. Owens Shark) and reports pain managed for these last few days post-surgery. No fever at home. They have tried multiple constipation medications (prune juice, Dulcolax, glycerin suppositories, Senokot and MOM) without BM. She is passing gas.   12:15 - Plain films, labs reassuring. Dr. Roderic Palau has seen and evaluated the patient and feels CT would better clarify any acute abdominal process. CT ordered. Pain managed with medications.   1:50 - CT is negative for obstruction or acute process in the abdomen. Re-eval of the patient finds her comfortable, sleeing, able to lie on her back (improved from admission to ED). Discussed results with patient and husband and stability for discharge home. Recommend Miralax for constipation and surgery follow up.  I personally performed the services described in this documentation, which was scribed in my presence. The recorded information has been reviewed and is accurate.     Charlann Lange, PA-C 04/01/16 [id number]  Milton Ferguson, MD 04/02/16 1025

## 2016-03-30 NOTE — ED Notes (Signed)
Pt had spinal fusion surgery last week. Is experiencing increased pain since 5pm and constipation x 6days. Pt has been able to ambulate with a walker since surgery. Bruising observed around incision site, but no obvious signs of infection noted.

## 2016-03-30 NOTE — ED Notes (Signed)
Pt had spinal fusion at [facility] on 5/4 and was discharged home on 5/7; pt states that the pain has been controlled with her pain medication until tonight; pt states that she has not a BM since prior to surgery; pt c/o abd pain and tenderness and pain to her back; pt states that she feels bloated; pt states that she began passing gas today; pt states that she has taken Glycerin Supp, Dulcolax Supp, Milk of Mag and Sennacot without a BM; pt reports temp of 99.9 at home; pt called on call MD and was advised to come to the ER for evaluation

## 2016-03-31 ENCOUNTER — Emergency Department (HOSPITAL_COMMUNITY): Payer: Medicare Other

## 2016-03-31 ENCOUNTER — Encounter (HOSPITAL_COMMUNITY): Payer: Self-pay | Admitting: Radiology

## 2016-03-31 DIAGNOSIS — M4800 Spinal stenosis, site unspecified: Secondary | ICD-10-CM | POA: Diagnosis not present

## 2016-03-31 DIAGNOSIS — K5900 Constipation, unspecified: Secondary | ICD-10-CM | POA: Diagnosis not present

## 2016-03-31 DIAGNOSIS — Z6825 Body mass index (BMI) 25.0-25.9, adult: Secondary | ICD-10-CM | POA: Diagnosis not present

## 2016-03-31 DIAGNOSIS — R188 Other ascites: Secondary | ICD-10-CM | POA: Diagnosis not present

## 2016-03-31 LAB — URINALYSIS, ROUTINE W REFLEX MICROSCOPIC
Bilirubin Urine: NEGATIVE
Glucose, UA: NEGATIVE mg/dL
Hgb urine dipstick: NEGATIVE
Ketones, ur: NEGATIVE mg/dL
NITRITE: NEGATIVE
PROTEIN: NEGATIVE mg/dL
SPECIFIC GRAVITY, URINE: 1.01 (ref 1.005–1.030)
pH: 8 (ref 5.0–8.0)

## 2016-03-31 LAB — URINE MICROSCOPIC-ADD ON

## 2016-03-31 MED ORDER — ONDANSETRON HCL 4 MG/2ML IJ SOLN
4.0000 mg | Freq: Once | INTRAMUSCULAR | Status: AC
Start: 2016-03-31 — End: 2016-03-31
  Administered 2016-03-31: 4 mg via INTRAVENOUS
  Filled 2016-03-31: qty 2

## 2016-03-31 MED ORDER — IOPAMIDOL (ISOVUE-300) INJECTION 61%
100.0000 mL | Freq: Once | INTRAVENOUS | Status: AC | PRN
  Administered 2016-03-31: 100 mL via INTRAVENOUS

## 2016-03-31 MED ORDER — POLYETHYLENE GLYCOL 3350 17 G PO PACK: PACK | ORAL | Status: DC

## 2016-03-31 MED ORDER — ONDANSETRON 4 MG PO TBDP: 4.0000 mg | ORAL_TABLET | Freq: Three times a day (TID) | ORAL | Status: DC | PRN

## 2016-03-31 MED ORDER — MORPHINE SULFATE (PF) 4 MG/ML IV SOLN
4.0000 mg | Freq: Once | INTRAVENOUS | Status: AC
  Administered 2016-03-31: 4 mg via INTRAVENOUS
  Filled 2016-03-31: qty 1

## 2016-03-31 NOTE — Discharge Instructions (Signed)
Constipation, Adult °Constipation is when a person has fewer than three bowel movements a week, has difficulty having a bowel movement, or has stools that are dry, hard, or larger than normal. As people grow older, constipation is more common. A low-fiber diet, not taking in enough fluids, and taking certain medicines may make constipation worse.  °CAUSES  °· Certain medicines, such as antidepressants, pain medicine, iron supplements, antacids, and water pills.   °· Certain diseases, such as diabetes, irritable bowel syndrome (IBS), thyroid disease, or depression.   °· Not drinking enough water.   °· Not eating enough fiber-rich foods.   °· Stress or travel.   °· Lack of physical activity or exercise.   °· Ignoring the urge to have a bowel movement.   °· Using laxatives too much.   °SIGNS AND SYMPTOMS  °· Having fewer than three bowel movements a week.   °· Straining to have a bowel movement.   °· Having stools that are hard, dry, or larger than normal.   °· Feeling full or bloated.   °· Pain in the lower abdomen.   °· Not feeling relief after having a bowel movement.   °DIAGNOSIS  °Your health care provider will take a medical history and perform a physical exam. Further testing may be done for severe constipation. Some tests may include: °· A barium enema X-ray to examine your rectum, colon, and, sometimes, your small intestine.   °· A sigmoidoscopy to examine your lower colon.   °· A colonoscopy to examine your entire colon. °TREATMENT  °Treatment will depend on the severity of your constipation and what is causing it. Some dietary treatments include drinking more fluids and eating more fiber-rich foods. Lifestyle treatments may include regular exercise. If these diet and lifestyle recommendations do not help, your health care provider may recommend taking over-the-counter laxative medicines to help you have bowel movements. Prescription medicines may be prescribed if over-the-counter medicines do not work.    °HOME CARE INSTRUCTIONS  °· Eat foods that have a lot of fiber, such as fruits, vegetables, whole grains, and beans. °· Limit foods high in fat and processed sugars, such as french fries, hamburgers, cookies, candies, and soda.   °· A fiber supplement may be added to your diet if you cannot get enough fiber from foods.   °· Drink enough fluids to keep your urine clear or pale yellow.   °· Exercise regularly or as directed by your health care provider.   °· Go to the restroom when you have the urge to go. Do not hold it.   °· Only take over-the-counter or prescription medicines as directed by your health care provider. Do not take other medicines for constipation without talking to your health care provider first.   °SEEK IMMEDIATE MEDICAL CARE IF:  °· You have bright red blood in your stool.   °· Your constipation lasts for more than 4 days or gets worse.   °· You have abdominal or rectal pain.   °· You have thin, pencil-like stools.   °· You have unexplained weight loss. °MAKE SURE YOU:  °· Understand these instructions. °· Will watch your condition. °· Will get help right away if you are not doing well or get worse. °  °This information is not intended to replace advice given to you by your health care provider. Make sure you discuss any questions you have with your health care provider. °  °Document Released: 08/06/2004 Document Revised: 11/29/2014 Document Reviewed: 08/20/2013 °Elsevier Interactive Patient Education ©2016 [employer] ° °High-Fiber Diet °Fiber, also called dietary fiber, is a type of carbohydrate found in fruits, vegetables, whole grains, and   beans. A high-fiber diet can have many health benefits. Your health care provider may recommend a high-fiber diet to help: °· Prevent constipation. Fiber can make your bowel movements more regular. °· Lower your cholesterol. °· Relieve hemorrhoids, uncomplicated diverticulosis, or irritable bowel syndrome. °· Prevent overeating as part of a weight-loss  plan. °· Prevent heart disease, type 2 diabetes, and certain cancers. °WHAT IS MY PLAN? °The recommended daily intake of fiber includes: °· 38 grams for men under age 50. °· 30 grams for men over age 50. °· 25 grams for women under age 50. °· 21 grams for women over age 50. °You can get the recommended daily intake of dietary fiber by eating a variety of fruits, vegetables, grains, and beans. Your health care provider may also recommend a fiber supplement if it is not possible to get enough fiber through your diet. °WHAT DO I NEED TO KNOW ABOUT A HIGH-FIBER DIET? °· Fiber supplements have not been widely studied for their effectiveness, so it is better to get fiber through food sources. °· Always check the fiber content on the nutrition facts label of any prepackaged food. Look for foods that contain at least 5 grams of fiber per serving. °· Ask your dietitian if you have questions about specific foods that are related to your condition, especially if those foods are not listed in the following section. °· Increase your daily fiber consumption gradually. Increasing your intake of dietary fiber too quickly may cause bloating, cramping, or gas. °· Drink plenty of water. Water helps you to digest fiber. °WHAT FOODS CAN I EAT? °Grains °Whole-grain breads. Multigrain cereal. Oats and oatmeal. Brown rice. Barley. Bulgur wheat. Millet. Bran muffins. Popcorn. Rye wafer crackers. °Vegetables °Sweet potatoes. Spinach. Kale. Artichokes. Cabbage. Broccoli. Green peas. Carrots. Squash. °Fruits °Berries. Pears. Apples. Oranges. Avocados. Prunes and raisins. Dried figs. °Meats and Other Protein Sources °Navy, kidney, pinto, and soy beans. Split peas. Lentils. Nuts and seeds. °Dairy °Fiber-fortified yogurt. °Beverages °Fiber-fortified soy milk. Fiber-fortified orange juice. °Other °Fiber bars. °The items listed above may not be a complete list of recommended foods or beverages. Contact your dietitian for more options. °WHAT FOODS  ARE NOT RECOMMENDED? °Grains °White bread. Pasta made with refined flour. White rice. °Vegetables °Fried potatoes. Canned vegetables. Well-cooked vegetables.  °Fruits °Fruit juice. Cooked, strained fruit. °Meats and Other Protein Sources °Fatty cuts of meat. Fried poultry or fried fish. °Dairy °Milk. Yogurt. Cream cheese. Sour cream. °Beverages °Soft drinks. °Other °Cakes and pastries. Butter and oils. °The items listed above may not be a complete list of foods and beverages to avoid. Contact your dietitian for more information. °WHAT ARE SOME TIPS FOR INCLUDING HIGH-FIBER FOODS IN MY DIET? °· Eat a wide variety of high-fiber foods. °· Make sure that half of all grains consumed each day are whole grains. °· Replace breads and cereals made from refined flour or white flour with whole-grain breads and cereals. °· Replace white rice with brown rice, bulgur wheat, or millet. °· Start the day with a breakfast that is high in fiber, such as a cereal that contains at least 5 grams of fiber per serving. °· Use beans in place of meat in soups, salads, or pasta. °· Eat high-fiber snacks, such as berries, raw vegetables, nuts, or popcorn. °  °This information is not intended to replace advice given to you by your health care provider. Make sure you discuss any questions you have with your health care provider. °  °Document Released: 11/08/2005 Document Revised: 11/29/2014 Document   Reviewed: 04/23/2014 °Elsevier Interactive Patient Education ©2016 [employer] Inc. ° °

## 2016-03-31 NOTE — ED Notes (Signed)
Pt in CT.

## 2016-04-02 ENCOUNTER — Encounter (HOSPITAL_COMMUNITY): Payer: Self-pay | Admitting: *Deleted

## 2016-04-02 ENCOUNTER — Emergency Department (HOSPITAL_COMMUNITY)
Admission: EM | Admit: 2016-04-02 | Discharge: 2016-04-02 | Disposition: A | Discharge status: Home / Self Care | Payer: Medicare Other | Attending: Emergency Medicine | Admitting: Emergency Medicine

## 2016-04-02 DIAGNOSIS — M1990 Unspecified osteoarthritis, unspecified site: Secondary | ICD-10-CM | POA: Diagnosis not present

## 2016-04-02 DIAGNOSIS — M5416 Radiculopathy, lumbar region: Secondary | ICD-10-CM | POA: Diagnosis not present

## 2016-04-02 DIAGNOSIS — Z79899 Other long term (current) drug therapy: Secondary | ICD-10-CM | POA: Diagnosis not present

## 2016-04-02 DIAGNOSIS — Z8679 Personal history of other diseases of the circulatory system: Secondary | ICD-10-CM | POA: Insufficient documentation

## 2016-04-02 DIAGNOSIS — K589 Irritable bowel syndrome without diarrhea: Secondary | ICD-10-CM | POA: Insufficient documentation

## 2016-04-02 DIAGNOSIS — M4806 Spinal stenosis, lumbar region: Secondary | ICD-10-CM | POA: Diagnosis not present

## 2016-04-02 DIAGNOSIS — K219 Gastro-esophageal reflux disease without esophagitis: Secondary | ICD-10-CM | POA: Insufficient documentation

## 2016-04-02 DIAGNOSIS — G43909 Migraine, unspecified, not intractable, without status migrainosus: Secondary | ICD-10-CM | POA: Insufficient documentation

## 2016-04-02 DIAGNOSIS — E039 Hypothyroidism, unspecified: Secondary | ICD-10-CM | POA: Insufficient documentation

## 2016-04-02 DIAGNOSIS — F329 Major depressive disorder, single episode, unspecified: Secondary | ICD-10-CM | POA: Insufficient documentation

## 2016-04-02 DIAGNOSIS — M545 Low back pain, unspecified: Secondary | ICD-10-CM

## 2016-04-02 DIAGNOSIS — Z9889 Other specified postprocedural states: Secondary | ICD-10-CM | POA: Diagnosis not present

## 2016-04-02 DIAGNOSIS — E559 Vitamin D deficiency, unspecified: Secondary | ICD-10-CM | POA: Diagnosis not present

## 2016-04-02 LAB — CBC WITH DIFFERENTIAL/PLATELET
Basophils Absolute: 0 10*3/uL (ref 0.0–0.1)
Basophils Relative: 0 %
EOS ABS: 0.6 10*3/uL (ref 0.0–0.7)
Eosinophils Relative: 6 %
HEMATOCRIT: 39.7 % (ref 36.0–46.0)
HEMOGLOBIN: 13.1 g/dL (ref 12.0–15.0)
LYMPHS ABS: 1.8 10*3/uL (ref 0.7–4.0)
Lymphocytes Relative: 17 %
MCH: 29.7 pg (ref 26.0–34.0)
MCHC: 33 g/dL (ref 30.0–36.0)
MCV: 90 fL (ref 78.0–100.0)
MONOS PCT: 8 %
Monocytes Absolute: 0.8 10*3/uL (ref 0.1–1.0)
NEUTROS PCT: 69 %
Neutro Abs: 7.2 10*3/uL (ref 1.7–7.7)
Platelets: 361 10*3/uL (ref 150–400)
RBC: 4.41 MIL/uL (ref 3.87–5.11)
RDW: 13.3 % (ref 11.5–15.5)
WBC: 10.4 10*3/uL (ref 4.0–10.5)

## 2016-04-02 LAB — BASIC METABOLIC PANEL
Anion gap: 12 (ref 5–15)
BUN: 15 mg/dL (ref 6–20)
CHLORIDE: 104 mmol/L (ref 101–111)
CO2: 24 mmol/L (ref 22–32)
CREATININE: 0.83 mg/dL (ref 0.44–1.00)
Calcium: 9.3 mg/dL (ref 8.9–10.3)
GFR calc Af Amer: >60 mL/min (ref >60)
GFR calc non Af Amer: >60 mL/min (ref >60)
GLUCOSE: 98 mg/dL (ref 65–99)
Potassium: 4.3 mmol/L (ref 3.5–5.1)
SODIUM: 140 mmol/L (ref 135–145)

## 2016-04-02 MED ORDER — HYDROMORPHONE HCL 1 MG/ML IJ SOLN
1.0000 mg | Freq: Once | INTRAMUSCULAR | Status: AC
  Administered 2016-04-02: 1 mg via INTRAVENOUS
  Filled 2016-04-02: qty 1

## 2016-04-02 MED ORDER — ONDANSETRON HCL 4 MG/2ML IJ SOLN
4.0000 mg | Freq: Once | INTRAMUSCULAR | Status: AC
  Administered 2016-04-02: 4 mg via INTRAVENOUS
  Filled 2016-04-02: qty 2

## 2016-04-02 MED ORDER — METHYLPREDNISOLONE SODIUM SUCC 125 MG IJ SOLR
125.0000 mg | Freq: Once | INTRAMUSCULAR | Status: DC
  Filled 2016-04-02: qty 2

## 2016-04-02 MED ORDER — HYDROMORPHONE HCL 4 MG PO TABS: 4.0000 mg | ORAL_TABLET | Freq: Four times a day (QID) | ORAL | Status: DC | PRN

## 2016-04-02 NOTE — ED Provider Notes (Signed)
CSN: [id number]     Arrival date & time 04/02/16  1108 History   First MD Initiated Contact with Patient 04/02/16 1135     Chief Complaint  Patient presents with  . Back Pain     (Consider location/radiation/quality/duration/timing/severity/associated sxs/prior Treatment) Patient is a 70 y.o. female presenting with back pain. The history is provided by the patient (Patient complains of severe lower back pain. She recently had surgery in her lower back.).  Back Pain Location:  Lumbar spine Quality:  Aching Pain severity:  Severe Pain is:  Unable to specify Onset quality:  Sudden Timing:  Constant Progression:  Worsening Associated symptoms: no abdominal pain, no chest pain and no headaches     Past Medical History  Diagnosis Date  . Complication of anesthesia 90's    block for elbow surgery and pt had a seizure... surgery since with no problem   . Hypothyroidism   . GERD (gastroesophageal reflux disease)   . Arthritis     osteoarthritis  . Spinal stenosis   . History of pericarditis     DEC 2008 AND SMALL PERICARDIAL EFFUSION--   RESOLVED  . Migraines   . Pelvic pain   . Vitamin D deficiency   . H/O hiatal hernia   . IBS (irritable bowel syndrome)   . Glaucoma of both eyes   . Hyperlipidemia   . Depression    Past Surgical History  Procedure Laterality Date  . Anterior cervical decomp/discectomy fusion  12/08/2011    Procedure: ANTERIOR CERVICAL DECOMPRESSION/DISCECTOMY FUSION 3 LEVELS;  Surgeon: Sinclair Ship, MD;  Location: [place];  Service: Orthopedics;  Laterality: N/A;  C 3-6 ACDF  . Cardiac catheterization  10-27-2007  DR BRUCE BRODIE    NORMAL CORONARY ARTERIES/ NORMAL LVF  . Transthoracic echocardiogram  03-04-2010    MODERATE LVH/ NORMAL LVSF/ MILD DIASTOLIC DYSFUNCTION/ EF [id number] MILD AI  . Cardiovascular stress test  02-25-2010  DR CRENSHAW    ANTERIOR ATTENUATION NO SCAR OR ISCHEMIA/ EF 81%  . Tonsillectomy  1951  . Cesarean section  1975  . Elbow  tendon release Left 1996  . Thumb tendon transposition Right 2012  . Glaucoma surgery Bilateral 2004  . Cataract extraction w/ intraocular lens  implant, bilateral    . Rhinoplasty  2010  . Total abdominal hysterectomy w/ bilateral salpingoophorectomy  1982  . Cysto with hydrodistension N/A 06/14/2013    Procedure: CYSTOSCOPY/HYDRODISTENSION/INSTILLATION OF MARCAINE AND PYRIDIUM;  Surgeon: Reece Packer, MD;  Location: [place];  Service: Urology;  Laterality: N/A;   Family History  Problem Relation Age of Onset  . Pulmonary fibrosis Mother   . Lung disease Father    Social History  Substance Use Topics  . Smoking status: Never Smoker   . Smokeless tobacco: Never Used  . Alcohol Use: 1.2 oz/week    2 Glasses of wine per week   OB History    No data available     Review of Systems  Constitutional: Negative for appetite change and fatigue.  HENT: Negative for congestion, ear discharge and sinus pressure.   Eyes: Negative for discharge.  Respiratory: Negative for cough.   Cardiovascular: Negative for chest pain.  Gastrointestinal: Positive for constipation. Negative for abdominal pain and diarrhea.  Genitourinary: Negative for frequency and hematuria.  Musculoskeletal: Positive for back pain.  Skin: Negative for rash.  Neurological: Negative for seizures and headaches.  Psychiatric/Behavioral: Negative for hallucinations.      Allergies  Adhesive; Aspirin; Avelox; Chlorthalidone;  Hydrochlorothiazide; Neomycin; Nsaids; and Sulfa antibiotics  Home Medications   Prior to Admission medications   Medication Sig Start Date End Date Taking? Authorizing Provider  baclofen (LIORESAL) 10 MG tablet Take 10 mg by mouth 4 (four) times daily as needed for muscle spasms.   Yes Historical Provider, MD  Calcium Carbonate (CALCIUM 600 PO) Take 1,200 mg by mouth daily.   Yes Historical Provider, MD  cholecalciferol (VITAMIN D) 1000 units tablet Take 2,000 Units by  mouth daily.   Yes Historical Provider, MD  dexlansoprazole (DEXILANT) 60 MG capsule Take 60 mg by mouth daily.   Yes Historical Provider, MD  escitalopram (LEXAPRO) 10 MG tablet Take 10 mg by mouth daily.  08/18/15  Yes Historical Provider, MD  fexofenadine (ALLEGRA) 180 MG tablet Take 180 mg by mouth daily.   Yes Historical Provider, MD  fluticasone (FLONASE) 50 MCG/ACT nasal spray Place 1 spray into both nostrils daily as needed for allergies.    Yes Historical Provider, MD  guaiFENesin (MUCINEX) 600 MG 12 hr tablet Take 600 mg by mouth 2 (two) times daily as needed for cough.   Yes Historical Provider, MD  levothyroxine (SYNTHROID, LEVOTHROID) 125 MCG tablet Take 125 mcg by mouth daily before breakfast.   Yes Historical Provider, MD  Multiple Vitamins-Minerals (MULTIVITAMINS THER. W/MINERALS) TABS Take 1 tablet by mouth daily.   Yes Historical Provider, MD  ondansetron (ZOFRAN ODT) 4 MG disintegrating tablet Take 1 tablet (4 mg total) by mouth every 8 (eight) hours as needed for nausea or vomiting. 03/31/16  Yes Charlann Lange, PA-C  oxyCODONE (OXY IR/ROXICODONE) 5 MG immediate release tablet Take 5-10 mg by mouth every 4 (four) hours as needed for moderate pain or severe pain.   Yes Historical Provider, MD  polyethylene glycol [facility]) packet Use three times daily until bowels move - Max 3 consecutive days 03/31/16  Yes Shari Upstill, PA-C  sennosides-docusate sodium (SENOKOT-S) 8.6-50 MG tablet Take 1 tablet by mouth 2 (two) times daily.   Yes Historical Provider, MD  SUMAtriptan (IMITREX) 100 MG tablet Take 1 tablet (100 mg total) by mouth every 2 (two) hours as needed for migraine. May repeat in 2 hours if headache persists or recurs. 08/26/15  Yes Hollace Kinnier Sofia, PA-C  telmisartan (MICARDIS) 80 MG tablet Take 40 mg by mouth daily.  08/20/15  Yes Historical Provider, MD  timolol (TIMOPTIC) 0.5 % ophthalmic solution Place 1 drop into both eyes daily.    Yes Historical Provider, MD  topiramate  (TOPAMAX) 100 MG tablet Take 100 mg by mouth every evening.   Yes Historical Provider, MD  Vitamin D, Ergocalciferol, (DRISDOL) 50000 UNITS CAPS Take 50,000 Units by mouth every 7 (seven) days. On Wednesdays.   Yes Historical Provider, MD  HYDROmorphone (DILAUDID) 4 MG tablet Take 1 tablet (4 mg total) by mouth every 6 (six) hours as needed for moderate pain or severe pain. 04/02/16   Milton Ferguson, MD  meclizine (ANTIVERT) 50 MG tablet Take 1 tablet (50 mg total) by mouth 3 (three) times daily as needed. Patient not taking: Reported on 03/30/2016 08/26/15   Hollace Kinnier Sofia, PA-C   BP 138/118 mmHg  Pulse 61  Temp(Src) 97.7 F (36.5 C) (Oral)  Resp 18  SpO2 94% Physical Exam  Constitutional: She is oriented to person, place, and time. She appears well-developed.  HENT:  Head: Normocephalic.  Eyes: Conjunctivae are normal.  Neck: No tracheal deviation present.  Musculoskeletal:  Tenderness over lumbar spine  Neurological: She is oriented to  person, place, and time.  Skin: Skin is warm.  Psychiatric: She has a normal mood and affect.    ED Course  Procedures (including critical care time) Labs Review Labs Reviewed  CBC WITH DIFFERENTIAL/PLATELET  BASIC METABOLIC PANEL    Imaging Review No results found. I have personally reviewed and evaluated these images and lab results as part of my medical decision-making.   EKG Interpretation None      MDM   Final diagnoses:  Back pain at L4-L5 level    Patient's pain improved with Dilaudid. She is going today to the interventional radiologist to get injections in her lumbar spine    Milton Ferguson, MD 04/02/16 1245

## 2016-04-02 NOTE — ED Notes (Signed)
Per pt report: pt was discharged Sunday from [facility] for a procedure on her back.  Pt reports increasing pain in lower back radiates down her right leg.  Pt reports pain is unresponsive to 10mg  of percocet's.  Pt a/o x 4.  Pt denies any motor deficits.

## 2016-04-02 NOTE — Discharge Instructions (Signed)
Follow-up as planned today with your doctor

## 2016-04-03 DIAGNOSIS — M5416 Radiculopathy, lumbar region: Secondary | ICD-10-CM | POA: Diagnosis not present

## 2016-04-03 DIAGNOSIS — M1990 Unspecified osteoarthritis, unspecified site: Secondary | ICD-10-CM | POA: Diagnosis present

## 2016-04-03 DIAGNOSIS — M5030 Other cervical disc degeneration, unspecified cervical region: Secondary | ICD-10-CM | POA: Diagnosis not present

## 2016-04-03 DIAGNOSIS — H409 Unspecified glaucoma: Secondary | ICD-10-CM | POA: Diagnosis present

## 2016-04-03 DIAGNOSIS — G589 Mononeuropathy, unspecified: Secondary | ICD-10-CM | POA: Diagnosis not present

## 2016-04-03 DIAGNOSIS — Z981 Arthrodesis status: Secondary | ICD-10-CM | POA: Diagnosis not present

## 2016-04-03 DIAGNOSIS — R262 Difficulty in walking, not elsewhere classified: Secondary | ICD-10-CM | POA: Diagnosis not present

## 2016-04-03 DIAGNOSIS — E039 Hypothyroidism, unspecified: Secondary | ICD-10-CM | POA: Diagnosis present

## 2016-04-03 DIAGNOSIS — K219 Gastro-esophageal reflux disease without esophagitis: Secondary | ICD-10-CM | POA: Diagnosis present

## 2016-04-03 DIAGNOSIS — G8918 Other acute postprocedural pain: Secondary | ICD-10-CM | POA: Diagnosis present

## 2016-04-03 DIAGNOSIS — H3530 Unspecified macular degeneration: Secondary | ICD-10-CM | POA: Diagnosis present

## 2016-04-03 DIAGNOSIS — M5010 Cervical disc disorder with radiculopathy, unspecified cervical region: Secondary | ICD-10-CM | POA: Diagnosis present

## 2016-04-15 DIAGNOSIS — R200 Anesthesia of skin: Secondary | ICD-10-CM | POA: Diagnosis not present

## 2016-04-15 DIAGNOSIS — M4806 Spinal stenosis, lumbar region: Secondary | ICD-10-CM | POA: Diagnosis not present

## 2016-04-15 DIAGNOSIS — M21379 Foot drop, unspecified foot: Secondary | ICD-10-CM | POA: Diagnosis not present

## 2016-04-20 DIAGNOSIS — M5410 Radiculopathy, site unspecified: Secondary | ICD-10-CM | POA: Diagnosis not present

## 2016-04-20 DIAGNOSIS — Z79899 Other long term (current) drug therapy: Secondary | ICD-10-CM | POA: Diagnosis not present

## 2016-04-20 DIAGNOSIS — Z981 Arthrodesis status: Secondary | ICD-10-CM | POA: Diagnosis not present

## 2016-04-29 DIAGNOSIS — Z79891 Long term (current) use of opiate analgesic: Secondary | ICD-10-CM | POA: Diagnosis not present

## 2016-04-29 DIAGNOSIS — M961 Postlaminectomy syndrome, not elsewhere classified: Secondary | ICD-10-CM | POA: Diagnosis not present

## 2016-04-29 DIAGNOSIS — M5416 Radiculopathy, lumbar region: Secondary | ICD-10-CM | POA: Diagnosis not present

## 2016-04-29 DIAGNOSIS — G894 Chronic pain syndrome: Secondary | ICD-10-CM | POA: Diagnosis not present

## 2016-05-10 ENCOUNTER — Emergency Department (HOSPITAL_COMMUNITY): Payer: Medicare Other

## 2016-05-10 ENCOUNTER — Emergency Department (HOSPITAL_COMMUNITY)
Admission: EM | Admit: 2016-05-10 | Discharge: 2016-05-10 | Disposition: A | Discharge status: Home / Self Care | Payer: Medicare Other | Attending: Emergency Medicine | Admitting: Emergency Medicine

## 2016-05-10 ENCOUNTER — Encounter (HOSPITAL_COMMUNITY): Payer: Self-pay | Admitting: Emergency Medicine

## 2016-05-10 DIAGNOSIS — E039 Hypothyroidism, unspecified: Secondary | ICD-10-CM | POA: Insufficient documentation

## 2016-05-10 DIAGNOSIS — Z7951 Long term (current) use of inhaled steroids: Secondary | ICD-10-CM | POA: Diagnosis not present

## 2016-05-10 DIAGNOSIS — E785 Hyperlipidemia, unspecified: Secondary | ICD-10-CM | POA: Diagnosis not present

## 2016-05-10 DIAGNOSIS — Z79899 Other long term (current) drug therapy: Secondary | ICD-10-CM | POA: Insufficient documentation

## 2016-05-10 DIAGNOSIS — Z79891 Long term (current) use of opiate analgesic: Secondary | ICD-10-CM | POA: Insufficient documentation

## 2016-05-10 DIAGNOSIS — M25571 Pain in right ankle and joints of right foot: Secondary | ICD-10-CM | POA: Diagnosis not present

## 2016-05-10 DIAGNOSIS — F329 Major depressive disorder, single episode, unspecified: Secondary | ICD-10-CM | POA: Diagnosis not present

## 2016-05-10 DIAGNOSIS — M79674 Pain in right toe(s): Secondary | ICD-10-CM | POA: Insufficient documentation

## 2016-05-10 DIAGNOSIS — S99921A Unspecified injury of right foot, initial encounter: Secondary | ICD-10-CM | POA: Diagnosis not present

## 2016-05-10 DIAGNOSIS — M1990 Unspecified osteoarthritis, unspecified site: Secondary | ICD-10-CM | POA: Insufficient documentation

## 2016-05-10 DIAGNOSIS — Z791 Long term (current) use of non-steroidal anti-inflammatories (NSAID): Secondary | ICD-10-CM | POA: Insufficient documentation

## 2016-05-10 NOTE — ED Notes (Signed)
Per pt, states back surgery in May-now having right toe and ankle pain due to it rolling

## 2016-05-10 NOTE — ED Notes (Signed)
Pt states that she would rather go home and take her regular medications than get IM shot here. PA made aware.

## 2016-05-10 NOTE — ED Provider Notes (Signed)
CSN: [id number]     Arrival date & time 05/10/16  1542 History   First MD Initiated Contact with Patient 05/10/16 1718     Chief Complaint  Patient presents with  . Toe Pain     (Consider location/radiation/quality/duration/timing/severity/associated sxs/prior Treatment) HPI   April Berg is a(n) 70 y.o. female who presents to the ED with cc of R toe pain. She has a pmh of spinal surgery with foot drop and numbmness of the R great toe. Her foot drop has been improving. She has had 3 episodes Over the past 3 weeks where she has caught her right toe when ambulating and have it height per her legs towards the plantar surface. She states she thinks she might also may have rolled her ankle. She complains that her pain has been exacerbated since that time. She is already on very high-dose narcotics including MS Contin BID and Percocet 10 qid for pain. She has been able to ambulate.  Past Medical History  Diagnosis Date  . Complication of anesthesia 90's    block for elbow surgery and pt had a seizure... surgery since with no problem   . Hypothyroidism   . GERD (gastroesophageal reflux disease)   . Arthritis     osteoarthritis  . Spinal stenosis   . History of pericarditis     DEC 2008 AND SMALL PERICARDIAL EFFUSION--   RESOLVED  . Migraines   . Pelvic pain   . Vitamin D deficiency   . H/O hiatal hernia   . IBS (irritable bowel syndrome)   . Glaucoma of both eyes   . Hyperlipidemia   . Depression    Past Surgical History  Procedure Laterality Date  . Anterior cervical decomp/discectomy fusion  12/08/2011    Procedure: ANTERIOR CERVICAL DECOMPRESSION/DISCECTOMY FUSION 3 LEVELS;  Surgeon: Sinclair Ship, MD;  Location: [place];  Service: Orthopedics;  Laterality: N/A;  C 3-6 ACDF  . Cardiac catheterization  10-27-2007  DR BRUCE BRODIE    NORMAL CORONARY ARTERIES/ NORMAL LVF  . Transthoracic echocardiogram  03-04-2010    MODERATE LVH/ NORMAL LVSF/ MILD DIASTOLIC DYSFUNCTION/  EF 60%/ MILD AI  . Cardiovascular stress test  02-25-2010  DR CRENSHAW    ANTERIOR ATTENUATION NO SCAR OR ISCHEMIA/ EF 81%  . Tonsillectomy  1951  . Cesarean section  1975  . Elbow tendon release Left 1996  . Thumb tendon transposition Right 2012  . Glaucoma surgery Bilateral 2004  . Cataract extraction w/ intraocular lens  implant, bilateral    . Rhinoplasty  2010  . Total abdominal hysterectomy w/ bilateral salpingoophorectomy  1982  . Cysto with hydrodistension N/A 06/14/2013    Procedure: CYSTOSCOPY/HYDRODISTENSION/INSTILLATION OF MARCAINE AND PYRIDIUM;  Surgeon: Reece Packer, MD;  Location: [place];  Service: Urology;  Laterality: N/A;   Family History  Problem Relation Age of Onset  . Pulmonary fibrosis Mother   . Lung disease Father    Social History  Substance Use Topics  . Smoking status: Never Smoker   . Smokeless tobacco: Never Used  . Alcohol Use: 1.2 oz/week    2 Glasses of wine per week   OB History    No data available     Review of Systems  Ten systems reviewed and are negative for acute change, except as noted in the HPI.    Allergies  Adhesive; Aspirin; Avelox; Chlorthalidone; Hydrochlorothiazide; Neomycin; Nsaids; and Sulfa antibiotics  Home Medications   Prior to Admission medications   Medication  Sig Start Date End Date Taking? Authorizing Provider  baclofen (LIORESAL) 10 MG tablet Take 10 mg by mouth 4 (four) times daily as needed for muscle spasms.    Historical Provider, MD  Calcium Carbonate (CALCIUM 600 PO) Take 1,200 mg by mouth daily.    Historical Provider, MD  cholecalciferol (VITAMIN D) 1000 units tablet Take 2,000 Units by mouth daily.    Historical Provider, MD  dexlansoprazole (DEXILANT) 60 MG capsule Take 60 mg by mouth daily.    Historical Provider, MD  escitalopram (LEXAPRO) 10 MG tablet Take 10 mg by mouth daily.  08/18/15   Historical Provider, MD  fexofenadine (ALLEGRA) 180 MG tablet Take 180 mg by mouth  daily.    Historical Provider, MD  fluticasone (FLONASE) 50 MCG/ACT nasal spray Place 1 spray into both nostrils daily as needed for allergies.     Historical Provider, MD  guaiFENesin (MUCINEX) 600 MG 12 hr tablet Take 600 mg by mouth 2 (two) times daily as needed for cough.    Historical Provider, MD  HYDROmorphone (DILAUDID) 4 MG tablet Take 1 tablet (4 mg total) by mouth every 6 (six) hours as needed for moderate pain or severe pain. 04/02/16   Milton Ferguson, MD  levothyroxine (SYNTHROID, LEVOTHROID) 125 MCG tablet Take 125 mcg by mouth daily before breakfast.    Historical Provider, MD  meclizine (ANTIVERT) 50 MG tablet Take 1 tablet (50 mg total) by mouth 3 (three) times daily as needed. Patient not taking: Reported on 03/30/2016 08/26/15   Fransico Meadow, PA-C  Multiple Vitamins-Minerals (MULTIVITAMINS THER. W/MINERALS) TABS Take 1 tablet by mouth daily.    Historical Provider, MD  ondansetron (ZOFRAN ODT) 4 MG disintegrating tablet Take 1 tablet (4 mg total) by mouth every 8 (eight) hours as needed for nausea or vomiting. 03/31/16   Charlann Lange, PA-C  oxyCODONE (OXY IR/ROXICODONE) 5 MG immediate release tablet Take 5-10 mg by mouth every 4 (four) hours as needed for moderate pain or severe pain.    Historical Provider, MD  polyethylene glycol [facility]) packet Use three times daily until bowels move - Max 3 consecutive days 03/31/16   Charlann Lange, PA-C  sennosides-docusate sodium (SENOKOT-S) 8.6-50 MG tablet Take 1 tablet by mouth 2 (two) times daily.    Historical Provider, MD  SUMAtriptan (IMITREX) 100 MG tablet Take 1 tablet (100 mg total) by mouth every 2 (two) hours as needed for migraine. May repeat in 2 hours if headache persists or recurs. 08/26/15   Fransico Meadow, PA-C  telmisartan (MICARDIS) 80 MG tablet Take 40 mg by mouth daily.  08/20/15   Historical Provider, MD  timolol (TIMOPTIC) 0.5 % ophthalmic solution Place 1 drop into both eyes daily.     Historical Provider, MD  topiramate  (TOPAMAX) 100 MG tablet Take 100 mg by mouth every evening.    Historical Provider, MD  Vitamin D, Ergocalciferol, (DRISDOL) 50000 UNITS CAPS Take 50,000 Units by mouth every 7 (seven) days. On Wednesdays.    Historical Provider, MD   Pulse 70  Temp(Src) 98.2 F (36.8 C) (Oral)  Resp 18  SpO2 100% Physical Exam  Constitutional: She is oriented to person, place, and time. She appears well-developed and well-nourished. No distress.  HENT:  Head: Normocephalic and atraumatic.  Eyes: Conjunctivae are normal. No scleral icterus.  Neck: Normal range of motion.  Cardiovascular: Normal rate, regular rhythm and normal heart sounds.  Exam reveals no gallop and no friction rub.   No murmur heard. Pulmonary/Chest: Effort  normal and breath sounds normal. No respiratory distress.  Abdominal: Soft. Bowel sounds are normal. She exhibits no distension and no mass. There is no tenderness. There is no guarding.  Musculoskeletal:  Normal pulses BL DP/PT pulses. ttp  flexor surface of the great R toe.  Weakness on the R with dorsiflexion which is not new.  No swelling, bruising or deformity.    Neurological: She is alert and oriented to person, place, and time.  Skin: Skin is warm and dry. She is not diaphoretic.  Nursing note and vitals reviewed.   ED Course  Procedures (including critical care time) Labs Review Labs Reviewed - No data to display  Imaging Review Dg Ankle Complete Right  05/10/2016  CLINICAL DATA:  Abnormal gait with development of pain in the ankle. EXAM: RIGHT ANKLE - COMPLETE 3+ VIEW COMPARISON:  None. FINDINGS: No visible joint effusion. No degenerative change. No recent fracture. Old medial ligamentous injury. IMPRESSION: Negative. Electronically Signed   By: Nelson Chimes M.D.   On: 05/10/2016 16:30   Dg Toe Great Right  05/10/2016  CLINICAL DATA:  Abnormal gait with repeated micro trauma to the great toe. Pain. EXAM: RIGHT GREAT TOE COMPARISON:  None FINDINGS: No evidence of  fracture, dislocation or other focal lesion. IMPRESSION: Negative. Electronically Signed   By: Nelson Chimes M.D.   On: 05/10/2016 16:30   I have personally reviewed and evaluated these images and lab results as part of my medical decision-making.   EKG Interpretation None      MDM   Final diagnoses:  None    Patient X-Ray negative for obvious fracture or dislocation. Pain managed in ED. Pt advised to follow up with orthopedics if symptoms persist for possibility of missed fracture diagnosis. Patient given brace while in ED, conservative therapy recommended and discussed. Patient will be dc home & is agreeable with above plan.     Margarita Mail, PA-C 05/10/16 1807  Davonna Belling, MD 05/11/16 [phone]

## 2016-05-10 NOTE — Discharge Instructions (Signed)
Turf Toe Turf toe is a condition of pain at the base of the big toe, located at the ball of the foot. The condition is usually caused from either jamming or extending the toe beyond normal limits (hyperextension). This is the result of pushing off repeatedly when running or jumping. The main problem is pain at the base of the toe, but there may also be stiffness and swelling. The name turf toe comes from the fact that this injury is especially common among athletes who play on hard surfaces, such as artificial turf and basketball courts. Hard surfaces combined with running and jumping makes this a common sports injury. DIAGNOSIS  The diagnosis of turftoeisnotdifficult. It is made by examination. X-rays may be taken to make sure there is nobreak in the bone (fracture). Not doing surgery (conservative treatment) solves the problem most of the time. Conservative treatment includes the following home care instructions. HOME CARE INSTRUCTIONS   Apply ice to the sore area for 15-20 minutes, 03-04 times per day while awake, for the first 4 days. Put the ice in a plastic bag and place a towel between the bag of ice and your skin. Use ice if possible following any activities, even after the first four days.  Keep your leg elevated when possible to lessen swelling and discomfort in the toe.  Use crutches with non-weight bearing on the affected foot for ten days, or as needed for pain. Then you may walk as the pain allows, or as instructed. Start gradually with weight bearing on the affected foot. Shoes with stiff soles will generally be helpful in limiting pain for the first 1 to 2 weeks.  Continue to use crutches or a cane until you can stand on your foot without causing pain.  Only take over-the-counter or prescription medicines for pain, discomfort, or fever as directed by your caregiver. SEEK IMMEDIATE MEDICAL CARE IF:   You have an increase in bruising, swelling, or pain in your toe.  Pain relief is  not obtained with medications. Turf toe can return, and problems may be slow to improve. This is more common if you return to athletic activities too soon and do not allow the problem to fully recover. Surgery is rarely needed, but in certain cases it may be necessary. If a bone spur forms and severely limits motion of the toe joint, surgery to remove the spur and improve motion of the big toe may be helpful.   This information is not intended to replace advice given to you by your health care provider. Make sure you discuss any questions you have with your health care provider.   Document Released: 04/30/2002 Document Revised: 01/31/2012 Document Reviewed: 05/21/2015 Elsevier Interactive Patient Education 2016 [employer]  

## 2016-05-11 DIAGNOSIS — M4326 Fusion of spine, lumbar region: Secondary | ICD-10-CM | POA: Diagnosis not present

## 2016-05-11 DIAGNOSIS — M545 Low back pain: Secondary | ICD-10-CM | POA: Diagnosis not present

## 2016-05-13 DIAGNOSIS — M961 Postlaminectomy syndrome, not elsewhere classified: Secondary | ICD-10-CM | POA: Diagnosis not present

## 2016-05-13 DIAGNOSIS — Z79891 Long term (current) use of opiate analgesic: Secondary | ICD-10-CM | POA: Diagnosis not present

## 2016-05-13 DIAGNOSIS — G894 Chronic pain syndrome: Secondary | ICD-10-CM | POA: Diagnosis not present

## 2016-05-13 DIAGNOSIS — M5416 Radiculopathy, lumbar region: Secondary | ICD-10-CM | POA: Diagnosis not present

## 2016-06-01 DIAGNOSIS — M545 Low back pain: Secondary | ICD-10-CM | POA: Diagnosis not present

## 2016-06-01 DIAGNOSIS — M4326 Fusion of spine, lumbar region: Secondary | ICD-10-CM | POA: Diagnosis not present

## 2016-06-03 DIAGNOSIS — M4326 Fusion of spine, lumbar region: Secondary | ICD-10-CM | POA: Diagnosis not present

## 2016-06-03 DIAGNOSIS — M545 Low back pain: Secondary | ICD-10-CM | POA: Diagnosis not present

## 2016-06-10 DIAGNOSIS — Z79891 Long term (current) use of opiate analgesic: Secondary | ICD-10-CM | POA: Diagnosis not present

## 2016-06-10 DIAGNOSIS — M5416 Radiculopathy, lumbar region: Secondary | ICD-10-CM | POA: Diagnosis not present

## 2016-06-10 DIAGNOSIS — M961 Postlaminectomy syndrome, not elsewhere classified: Secondary | ICD-10-CM | POA: Diagnosis not present

## 2016-06-10 DIAGNOSIS — G894 Chronic pain syndrome: Secondary | ICD-10-CM | POA: Diagnosis not present

## 2016-06-15 DIAGNOSIS — M545 Low back pain: Secondary | ICD-10-CM | POA: Diagnosis not present

## 2016-06-15 DIAGNOSIS — M4326 Fusion of spine, lumbar region: Secondary | ICD-10-CM | POA: Diagnosis not present

## 2016-06-16 DIAGNOSIS — M4806 Spinal stenosis, lumbar region: Secondary | ICD-10-CM | POA: Diagnosis not present

## 2016-06-18 DIAGNOSIS — M4326 Fusion of spine, lumbar region: Secondary | ICD-10-CM | POA: Diagnosis not present

## 2016-06-18 DIAGNOSIS — M545 Low back pain: Secondary | ICD-10-CM | POA: Diagnosis not present

## 2016-06-22 DIAGNOSIS — M545 Low back pain: Secondary | ICD-10-CM | POA: Diagnosis not present

## 2016-06-22 DIAGNOSIS — M4326 Fusion of spine, lumbar region: Secondary | ICD-10-CM | POA: Diagnosis not present

## 2016-07-05 DIAGNOSIS — M79604 Pain in right leg: Secondary | ICD-10-CM | POA: Diagnosis not present

## 2016-07-05 DIAGNOSIS — Z6825 Body mass index (BMI) 25.0-25.9, adult: Secondary | ICD-10-CM | POA: Diagnosis not present

## 2016-07-05 DIAGNOSIS — M7989 Other specified soft tissue disorders: Secondary | ICD-10-CM | POA: Diagnosis not present

## 2016-07-06 DIAGNOSIS — M5416 Radiculopathy, lumbar region: Secondary | ICD-10-CM | POA: Diagnosis not present

## 2016-07-06 DIAGNOSIS — G894 Chronic pain syndrome: Secondary | ICD-10-CM | POA: Diagnosis not present

## 2016-07-06 DIAGNOSIS — Z79891 Long term (current) use of opiate analgesic: Secondary | ICD-10-CM | POA: Diagnosis not present

## 2016-07-06 DIAGNOSIS — M961 Postlaminectomy syndrome, not elsewhere classified: Secondary | ICD-10-CM | POA: Diagnosis not present

## 2016-07-20 DIAGNOSIS — G43719 Chronic migraine without aura, intractable, without status migrainosus: Secondary | ICD-10-CM | POA: Diagnosis not present

## 2016-07-20 DIAGNOSIS — E784 Other hyperlipidemia: Secondary | ICD-10-CM | POA: Diagnosis not present

## 2016-07-20 DIAGNOSIS — G43019 Migraine without aura, intractable, without status migrainosus: Secondary | ICD-10-CM | POA: Diagnosis not present

## 2016-07-20 DIAGNOSIS — M859 Disorder of bone density and structure, unspecified: Secondary | ICD-10-CM | POA: Diagnosis not present

## 2016-07-20 DIAGNOSIS — N390 Urinary tract infection, site not specified: Secondary | ICD-10-CM | POA: Diagnosis not present

## 2016-07-20 DIAGNOSIS — E038 Other specified hypothyroidism: Secondary | ICD-10-CM | POA: Diagnosis not present

## 2016-07-20 DIAGNOSIS — I10 Essential (primary) hypertension: Secondary | ICD-10-CM | POA: Diagnosis not present

## 2016-07-20 DIAGNOSIS — G43809 Other migraine, not intractable, without status migrainosus: Secondary | ICD-10-CM | POA: Diagnosis not present

## 2016-07-23 DIAGNOSIS — Z1389 Encounter for screening for other disorder: Secondary | ICD-10-CM | POA: Diagnosis not present

## 2016-07-23 DIAGNOSIS — M21371 Foot drop, right foot: Secondary | ICD-10-CM | POA: Diagnosis not present

## 2016-07-23 DIAGNOSIS — Z0000 Encounter for general adult medical examination without abnormal findings: Secondary | ICD-10-CM | POA: Diagnosis not present

## 2016-07-23 DIAGNOSIS — F329 Major depressive disorder, single episode, unspecified: Secondary | ICD-10-CM | POA: Diagnosis not present

## 2016-07-23 DIAGNOSIS — E784 Other hyperlipidemia: Secondary | ICD-10-CM | POA: Diagnosis not present

## 2016-07-23 DIAGNOSIS — I10 Essential (primary) hypertension: Secondary | ICD-10-CM | POA: Diagnosis not present

## 2016-07-23 DIAGNOSIS — M8580 Other specified disorders of bone density and structure, unspecified site: Secondary | ICD-10-CM | POA: Diagnosis not present

## 2016-07-23 DIAGNOSIS — K219 Gastro-esophageal reflux disease without esophagitis: Secondary | ICD-10-CM | POA: Diagnosis not present

## 2016-07-23 DIAGNOSIS — Z6823 Body mass index (BMI) 23.0-23.9, adult: Secondary | ICD-10-CM | POA: Diagnosis not present

## 2016-07-23 DIAGNOSIS — E038 Other specified hypothyroidism: Secondary | ICD-10-CM | POA: Diagnosis not present

## 2016-07-23 DIAGNOSIS — G43909 Migraine, unspecified, not intractable, without status migrainosus: Secondary | ICD-10-CM | POA: Diagnosis not present

## 2016-07-23 DIAGNOSIS — M4800 Spinal stenosis, site unspecified: Secondary | ICD-10-CM | POA: Diagnosis not present

## 2016-07-30 DIAGNOSIS — M961 Postlaminectomy syndrome, not elsewhere classified: Secondary | ICD-10-CM | POA: Diagnosis not present

## 2016-07-30 DIAGNOSIS — G894 Chronic pain syndrome: Secondary | ICD-10-CM | POA: Diagnosis not present

## 2016-07-30 DIAGNOSIS — Z79891 Long term (current) use of opiate analgesic: Secondary | ICD-10-CM | POA: Diagnosis not present

## 2016-07-30 DIAGNOSIS — M5416 Radiculopathy, lumbar region: Secondary | ICD-10-CM | POA: Diagnosis not present

## 2016-08-09 DIAGNOSIS — H52203 Unspecified astigmatism, bilateral: Secondary | ICD-10-CM | POA: Diagnosis not present

## 2016-08-09 DIAGNOSIS — Z961 Presence of intraocular lens: Secondary | ICD-10-CM | POA: Diagnosis not present

## 2016-08-09 DIAGNOSIS — H401131 Primary open-angle glaucoma, bilateral, mild stage: Secondary | ICD-10-CM | POA: Diagnosis not present

## 2016-08-09 DIAGNOSIS — H02403 Unspecified ptosis of bilateral eyelids: Secondary | ICD-10-CM | POA: Diagnosis not present

## 2016-08-11 DIAGNOSIS — Z6823 Body mass index (BMI) 23.0-23.9, adult: Secondary | ICD-10-CM | POA: Diagnosis not present

## 2016-08-11 DIAGNOSIS — R05 Cough: Secondary | ICD-10-CM | POA: Diagnosis not present

## 2016-08-11 DIAGNOSIS — M4800 Spinal stenosis, site unspecified: Secondary | ICD-10-CM | POA: Diagnosis not present

## 2016-09-01 DIAGNOSIS — M48061 Spinal stenosis, lumbar region without neurogenic claudication: Secondary | ICD-10-CM | POA: Diagnosis not present

## 2016-09-27 DIAGNOSIS — I10 Essential (primary) hypertension: Secondary | ICD-10-CM | POA: Diagnosis not present

## 2016-09-27 DIAGNOSIS — J0100 Acute maxillary sinusitis, unspecified: Secondary | ICD-10-CM | POA: Diagnosis not present

## 2016-09-27 DIAGNOSIS — J3089 Other allergic rhinitis: Secondary | ICD-10-CM | POA: Diagnosis not present

## 2016-09-27 DIAGNOSIS — H9201 Otalgia, right ear: Secondary | ICD-10-CM | POA: Diagnosis not present

## 2016-09-27 DIAGNOSIS — Z6824 Body mass index (BMI) 24.0-24.9, adult: Secondary | ICD-10-CM | POA: Diagnosis not present

## 2016-10-05 DIAGNOSIS — H6981 Other specified disorders of Eustachian tube, right ear: Secondary | ICD-10-CM | POA: Diagnosis not present

## 2016-10-05 DIAGNOSIS — Z7951 Long term (current) use of inhaled steroids: Secondary | ICD-10-CM | POA: Diagnosis not present

## 2016-10-05 DIAGNOSIS — E039 Hypothyroidism, unspecified: Secondary | ICD-10-CM | POA: Diagnosis not present

## 2016-10-05 DIAGNOSIS — J328 Other chronic sinusitis: Secondary | ICD-10-CM | POA: Diagnosis not present

## 2016-10-05 DIAGNOSIS — J309 Allergic rhinitis, unspecified: Secondary | ICD-10-CM | POA: Diagnosis not present

## 2016-10-05 DIAGNOSIS — I10 Essential (primary) hypertension: Secondary | ICD-10-CM | POA: Diagnosis not present

## 2016-10-05 DIAGNOSIS — Z9889 Other specified postprocedural states: Secondary | ICD-10-CM | POA: Diagnosis not present

## 2016-10-05 DIAGNOSIS — R2689 Other abnormalities of gait and mobility: Secondary | ICD-10-CM | POA: Diagnosis not present

## 2016-10-05 DIAGNOSIS — Z888 Allergy status to other drugs, medicaments and biological substances status: Secondary | ICD-10-CM | POA: Diagnosis not present

## 2016-10-05 DIAGNOSIS — Z79899 Other long term (current) drug therapy: Secondary | ICD-10-CM | POA: Diagnosis not present

## 2016-10-05 DIAGNOSIS — Z886 Allergy status to analgesic agent status: Secondary | ICD-10-CM | POA: Diagnosis not present

## 2016-10-05 DIAGNOSIS — Z9089 Acquired absence of other organs: Secondary | ICD-10-CM | POA: Diagnosis not present

## 2016-10-05 DIAGNOSIS — H6983 Other specified disorders of Eustachian tube, bilateral: Secondary | ICD-10-CM | POA: Diagnosis not present

## 2016-10-05 DIAGNOSIS — J329 Chronic sinusitis, unspecified: Secondary | ICD-10-CM | POA: Diagnosis not present

## 2016-10-05 DIAGNOSIS — H9311 Tinnitus, right ear: Secondary | ICD-10-CM | POA: Diagnosis not present

## 2016-10-05 DIAGNOSIS — Z881 Allergy status to other antibiotic agents status: Secondary | ICD-10-CM | POA: Diagnosis not present

## 2016-10-05 DIAGNOSIS — Z882 Allergy status to sulfonamides status: Secondary | ICD-10-CM | POA: Diagnosis not present

## 2016-10-05 DIAGNOSIS — H8102 Meniere's disease, left ear: Secondary | ICD-10-CM | POA: Diagnosis not present

## 2016-10-12 ENCOUNTER — Other Ambulatory Visit: Payer: Self-pay | Admitting: Internal Medicine

## 2016-10-12 DIAGNOSIS — Z1231 Encounter for screening mammogram for malignant neoplasm of breast: Secondary | ICD-10-CM

## 2016-10-13 DIAGNOSIS — M48062 Spinal stenosis, lumbar region with neurogenic claudication: Secondary | ICD-10-CM | POA: Diagnosis not present

## 2016-10-18 DIAGNOSIS — Z79891 Long term (current) use of opiate analgesic: Secondary | ICD-10-CM | POA: Diagnosis not present

## 2016-10-18 DIAGNOSIS — M5416 Radiculopathy, lumbar region: Secondary | ICD-10-CM | POA: Diagnosis not present

## 2016-10-18 DIAGNOSIS — G894 Chronic pain syndrome: Secondary | ICD-10-CM | POA: Diagnosis not present

## 2016-10-18 DIAGNOSIS — M961 Postlaminectomy syndrome, not elsewhere classified: Secondary | ICD-10-CM | POA: Diagnosis not present

## 2016-10-20 DIAGNOSIS — M25571 Pain in right ankle and joints of right foot: Secondary | ICD-10-CM | POA: Diagnosis not present

## 2016-10-20 DIAGNOSIS — M25511 Pain in right shoulder: Secondary | ICD-10-CM | POA: Diagnosis not present

## 2016-10-27 DIAGNOSIS — M25511 Pain in right shoulder: Secondary | ICD-10-CM | POA: Diagnosis not present

## 2016-10-28 DIAGNOSIS — M25571 Pain in right ankle and joints of right foot: Secondary | ICD-10-CM | POA: Diagnosis not present

## 2016-11-04 DIAGNOSIS — M25511 Pain in right shoulder: Secondary | ICD-10-CM | POA: Diagnosis not present

## 2016-11-04 DIAGNOSIS — M25571 Pain in right ankle and joints of right foot: Secondary | ICD-10-CM | POA: Diagnosis not present

## 2016-11-25 ENCOUNTER — Ambulatory Visit
Admission: RE | Admit: 2016-11-25 | Discharge: 2016-11-25 | Disposition: A | Discharge status: Home / Self Care | Payer: Medicare Other | Source: Ambulatory Visit | Attending: Internal Medicine | Admitting: Internal Medicine

## 2016-11-25 DIAGNOSIS — Z1231 Encounter for screening mammogram for malignant neoplasm of breast: Secondary | ICD-10-CM | POA: Diagnosis not present

## 2016-12-23 DIAGNOSIS — H35372 Puckering of macula, left eye: Secondary | ICD-10-CM | POA: Diagnosis not present

## 2016-12-23 DIAGNOSIS — H353132 Nonexudative age-related macular degeneration, bilateral, intermediate dry stage: Secondary | ICD-10-CM | POA: Diagnosis not present

## 2016-12-23 DIAGNOSIS — D3132 Benign neoplasm of left choroid: Secondary | ICD-10-CM | POA: Diagnosis not present

## 2016-12-23 DIAGNOSIS — N3010 Interstitial cystitis (chronic) without hematuria: Secondary | ICD-10-CM | POA: Diagnosis not present

## 2016-12-23 DIAGNOSIS — Z961 Presence of intraocular lens: Secondary | ICD-10-CM | POA: Diagnosis not present

## 2016-12-30 DIAGNOSIS — Z981 Arthrodesis status: Secondary | ICD-10-CM | POA: Diagnosis not present

## 2016-12-30 DIAGNOSIS — Z9889 Other specified postprocedural states: Secondary | ICD-10-CM | POA: Diagnosis not present

## 2016-12-30 DIAGNOSIS — M542 Cervicalgia: Secondary | ICD-10-CM | POA: Diagnosis not present

## 2016-12-30 DIAGNOSIS — M48061 Spinal stenosis, lumbar region without neurogenic claudication: Secondary | ICD-10-CM | POA: Diagnosis not present

## 2016-12-30 DIAGNOSIS — M5412 Radiculopathy, cervical region: Secondary | ICD-10-CM | POA: Diagnosis not present

## 2016-12-30 DIAGNOSIS — M25511 Pain in right shoulder: Secondary | ICD-10-CM | POA: Diagnosis not present

## 2017-01-25 DIAGNOSIS — G43719 Chronic migraine without aura, intractable, without status migrainosus: Secondary | ICD-10-CM | POA: Diagnosis not present

## 2017-01-25 DIAGNOSIS — G43809 Other migraine, not intractable, without status migrainosus: Secondary | ICD-10-CM | POA: Diagnosis not present

## 2017-01-25 DIAGNOSIS — G43019 Migraine without aura, intractable, without status migrainosus: Secondary | ICD-10-CM | POA: Diagnosis not present

## 2017-02-14 DIAGNOSIS — Z961 Presence of intraocular lens: Secondary | ICD-10-CM | POA: Diagnosis not present

## 2017-02-14 DIAGNOSIS — H35373 Puckering of macula, bilateral: Secondary | ICD-10-CM | POA: Diagnosis not present

## 2017-02-14 DIAGNOSIS — H401131 Primary open-angle glaucoma, bilateral, mild stage: Secondary | ICD-10-CM | POA: Diagnosis not present

## 2017-02-14 DIAGNOSIS — D3132 Benign neoplasm of left choroid: Secondary | ICD-10-CM | POA: Diagnosis not present

## 2017-02-15 DIAGNOSIS — M961 Postlaminectomy syndrome, not elsewhere classified: Secondary | ICD-10-CM | POA: Diagnosis not present

## 2017-02-15 DIAGNOSIS — M47812 Spondylosis without myelopathy or radiculopathy, cervical region: Secondary | ICD-10-CM | POA: Diagnosis not present

## 2017-02-15 DIAGNOSIS — M5416 Radiculopathy, lumbar region: Secondary | ICD-10-CM | POA: Diagnosis not present

## 2017-02-15 DIAGNOSIS — G894 Chronic pain syndrome: Secondary | ICD-10-CM | POA: Diagnosis not present

## 2017-02-28 DIAGNOSIS — M79651 Pain in right thigh: Secondary | ICD-10-CM | POA: Diagnosis not present

## 2017-02-28 DIAGNOSIS — M48061 Spinal stenosis, lumbar region without neurogenic claudication: Secondary | ICD-10-CM | POA: Diagnosis not present

## 2017-03-10 DIAGNOSIS — M79651 Pain in right thigh: Secondary | ICD-10-CM | POA: Diagnosis not present

## 2017-03-11 DIAGNOSIS — H04123 Dry eye syndrome of bilateral lacrimal glands: Secondary | ICD-10-CM | POA: Diagnosis not present

## 2017-03-15 DIAGNOSIS — M25512 Pain in left shoulder: Secondary | ICD-10-CM | POA: Diagnosis not present

## 2017-03-18 DIAGNOSIS — M7061 Trochanteric bursitis, right hip: Secondary | ICD-10-CM | POA: Diagnosis not present

## 2017-03-18 DIAGNOSIS — M25551 Pain in right hip: Secondary | ICD-10-CM | POA: Diagnosis not present

## 2017-03-23 DIAGNOSIS — M79644 Pain in right finger(s): Secondary | ICD-10-CM | POA: Diagnosis not present

## 2017-03-24 DIAGNOSIS — N3010 Interstitial cystitis (chronic) without hematuria: Secondary | ICD-10-CM | POA: Diagnosis not present

## 2017-03-28 DIAGNOSIS — Z981 Arthrodesis status: Secondary | ICD-10-CM | POA: Diagnosis not present

## 2017-03-28 DIAGNOSIS — M25511 Pain in right shoulder: Secondary | ICD-10-CM | POA: Diagnosis not present

## 2017-03-30 DIAGNOSIS — Z981 Arthrodesis status: Secondary | ICD-10-CM | POA: Diagnosis not present

## 2017-03-30 DIAGNOSIS — M48062 Spinal stenosis, lumbar region with neurogenic claudication: Secondary | ICD-10-CM | POA: Diagnosis not present

## 2017-04-06 ENCOUNTER — Ambulatory Visit: Payer: Self-pay | Admitting: Podiatry

## 2017-05-04 DIAGNOSIS — M25551 Pain in right hip: Secondary | ICD-10-CM | POA: Diagnosis not present

## 2017-05-04 DIAGNOSIS — M7061 Trochanteric bursitis, right hip: Secondary | ICD-10-CM | POA: Diagnosis not present

## 2017-05-18 DIAGNOSIS — M25512 Pain in left shoulder: Secondary | ICD-10-CM | POA: Diagnosis not present

## 2017-05-24 DIAGNOSIS — M25512 Pain in left shoulder: Secondary | ICD-10-CM | POA: Diagnosis not present

## 2017-06-02 DIAGNOSIS — M25512 Pain in left shoulder: Secondary | ICD-10-CM | POA: Diagnosis not present

## 2017-06-03 DIAGNOSIS — M7061 Trochanteric bursitis, right hip: Secondary | ICD-10-CM | POA: Diagnosis not present

## 2017-06-21 DIAGNOSIS — M961 Postlaminectomy syndrome, not elsewhere classified: Secondary | ICD-10-CM | POA: Diagnosis not present

## 2017-06-21 DIAGNOSIS — M5416 Radiculopathy, lumbar region: Secondary | ICD-10-CM | POA: Diagnosis not present

## 2017-06-21 DIAGNOSIS — M47812 Spondylosis without myelopathy or radiculopathy, cervical region: Secondary | ICD-10-CM | POA: Diagnosis not present

## 2017-06-21 DIAGNOSIS — G894 Chronic pain syndrome: Secondary | ICD-10-CM | POA: Diagnosis not present

## 2017-06-22 DIAGNOSIS — Z981 Arthrodesis status: Secondary | ICD-10-CM | POA: Diagnosis not present

## 2017-06-22 DIAGNOSIS — M48061 Spinal stenosis, lumbar region without neurogenic claudication: Secondary | ICD-10-CM | POA: Diagnosis not present

## 2017-06-22 DIAGNOSIS — M542 Cervicalgia: Secondary | ICD-10-CM | POA: Diagnosis not present

## 2017-06-22 DIAGNOSIS — M5412 Radiculopathy, cervical region: Secondary | ICD-10-CM | POA: Diagnosis not present

## 2017-06-22 DIAGNOSIS — M25511 Pain in right shoulder: Secondary | ICD-10-CM | POA: Diagnosis not present

## 2017-08-03 DIAGNOSIS — N3010 Interstitial cystitis (chronic) without hematuria: Secondary | ICD-10-CM | POA: Diagnosis not present

## 2017-08-09 DIAGNOSIS — E038 Other specified hypothyroidism: Secondary | ICD-10-CM | POA: Diagnosis not present

## 2017-08-09 DIAGNOSIS — M859 Disorder of bone density and structure, unspecified: Secondary | ICD-10-CM | POA: Diagnosis not present

## 2017-08-09 DIAGNOSIS — I10 Essential (primary) hypertension: Secondary | ICD-10-CM | POA: Diagnosis not present

## 2017-08-09 DIAGNOSIS — E784 Other hyperlipidemia: Secondary | ICD-10-CM | POA: Diagnosis not present

## 2017-08-10 DIAGNOSIS — N3010 Interstitial cystitis (chronic) without hematuria: Secondary | ICD-10-CM | POA: Diagnosis not present

## 2017-08-12 DIAGNOSIS — M8580 Other specified disorders of bone density and structure, unspecified site: Secondary | ICD-10-CM | POA: Diagnosis not present

## 2017-08-12 DIAGNOSIS — M4800 Spinal stenosis, site unspecified: Secondary | ICD-10-CM | POA: Diagnosis not present

## 2017-08-12 DIAGNOSIS — F3342 Major depressive disorder, recurrent, in full remission: Secondary | ICD-10-CM | POA: Diagnosis not present

## 2017-08-12 DIAGNOSIS — Z0000 Encounter for general adult medical examination without abnormal findings: Secondary | ICD-10-CM | POA: Diagnosis not present

## 2017-08-12 DIAGNOSIS — E784 Other hyperlipidemia: Secondary | ICD-10-CM | POA: Diagnosis not present

## 2017-08-12 DIAGNOSIS — Z23 Encounter for immunization: Secondary | ICD-10-CM | POA: Diagnosis not present

## 2017-08-12 DIAGNOSIS — Z6824 Body mass index (BMI) 24.0-24.9, adult: Secondary | ICD-10-CM | POA: Diagnosis not present

## 2017-08-12 DIAGNOSIS — Z1389 Encounter for screening for other disorder: Secondary | ICD-10-CM | POA: Diagnosis not present

## 2017-08-12 DIAGNOSIS — I10 Essential (primary) hypertension: Secondary | ICD-10-CM | POA: Diagnosis not present

## 2017-08-12 DIAGNOSIS — N3010 Interstitial cystitis (chronic) without hematuria: Secondary | ICD-10-CM | POA: Diagnosis not present

## 2017-08-12 DIAGNOSIS — K589 Irritable bowel syndrome without diarrhea: Secondary | ICD-10-CM | POA: Diagnosis not present

## 2017-08-12 DIAGNOSIS — E038 Other specified hypothyroidism: Secondary | ICD-10-CM | POA: Diagnosis not present

## 2017-08-15 DIAGNOSIS — G43809 Other migraine, not intractable, without status migrainosus: Secondary | ICD-10-CM | POA: Diagnosis not present

## 2017-08-15 DIAGNOSIS — G43719 Chronic migraine without aura, intractable, without status migrainosus: Secondary | ICD-10-CM | POA: Diagnosis not present

## 2017-08-15 DIAGNOSIS — G43019 Migraine without aura, intractable, without status migrainosus: Secondary | ICD-10-CM | POA: Diagnosis not present

## 2017-08-17 DIAGNOSIS — H401131 Primary open-angle glaucoma, bilateral, mild stage: Secondary | ICD-10-CM | POA: Diagnosis not present

## 2017-08-17 DIAGNOSIS — H52203 Unspecified astigmatism, bilateral: Secondary | ICD-10-CM | POA: Diagnosis not present

## 2017-08-17 DIAGNOSIS — Z961 Presence of intraocular lens: Secondary | ICD-10-CM | POA: Diagnosis not present

## 2017-08-29 DIAGNOSIS — L821 Other seborrheic keratosis: Secondary | ICD-10-CM | POA: Diagnosis not present

## 2017-09-08 DIAGNOSIS — N3010 Interstitial cystitis (chronic) without hematuria: Secondary | ICD-10-CM | POA: Diagnosis not present

## 2017-09-08 DIAGNOSIS — R300 Dysuria: Secondary | ICD-10-CM | POA: Diagnosis not present

## 2017-09-14 DIAGNOSIS — M7502 Adhesive capsulitis of left shoulder: Secondary | ICD-10-CM | POA: Diagnosis not present

## 2017-09-14 DIAGNOSIS — G8929 Other chronic pain: Secondary | ICD-10-CM | POA: Diagnosis not present

## 2017-09-14 DIAGNOSIS — M25511 Pain in right shoulder: Secondary | ICD-10-CM | POA: Diagnosis not present

## 2017-09-20 ENCOUNTER — Encounter: Payer: Self-pay | Admitting: Internal Medicine

## 2017-09-20 DIAGNOSIS — M5416 Radiculopathy, lumbar region: Secondary | ICD-10-CM | POA: Diagnosis not present

## 2017-09-20 DIAGNOSIS — M961 Postlaminectomy syndrome, not elsewhere classified: Secondary | ICD-10-CM | POA: Diagnosis not present

## 2017-09-20 DIAGNOSIS — Z79891 Long term (current) use of opiate analgesic: Secondary | ICD-10-CM | POA: Diagnosis not present

## 2017-09-20 DIAGNOSIS — G894 Chronic pain syndrome: Secondary | ICD-10-CM | POA: Diagnosis not present

## 2017-09-20 DIAGNOSIS — M47812 Spondylosis without myelopathy or radiculopathy, cervical region: Secondary | ICD-10-CM | POA: Diagnosis not present

## 2017-09-22 ENCOUNTER — Ambulatory Visit (INDEPENDENT_AMBULATORY_CARE_PROVIDER_SITE_OTHER): Payer: Medicare Other

## 2017-09-22 ENCOUNTER — Encounter: Payer: Self-pay | Admitting: Podiatry

## 2017-09-22 ENCOUNTER — Ambulatory Visit (INDEPENDENT_AMBULATORY_CARE_PROVIDER_SITE_OTHER): Payer: Medicare Other | Admitting: Podiatry

## 2017-09-22 DIAGNOSIS — M205X2 Other deformities of toe(s) (acquired), left foot: Secondary | ICD-10-CM

## 2017-09-22 DIAGNOSIS — M21619 Bunion of unspecified foot: Secondary | ICD-10-CM

## 2017-09-22 NOTE — Progress Notes (Signed)
Subjective:    Patient ID: April Berg, female    DOB: [birth year], 71 y.o.   MRN: [id number]  HPI  71 year old female presents the office today for concerns of pain to the left foot she points on the bunion site. This has been ongoing for about 2 years. She does that she wear sandals it does help when she was closing she starts to hurt. She's had no other treatment for this. She denies any numbness or tingling. Denies any swelling. She has no other concerns today.   Review of Systems  All other systems reviewed and are negative.  Past Medical History:  Diagnosis Date  . Arthritis    osteoarthritis  . Complication of anesthesia 90's   block for elbow surgery and pt had a seizure... surgery since with no problem   . Depression   . GERD (gastroesophageal reflux disease)   . Glaucoma of both eyes   . H/O hiatal hernia   . History of pericarditis    DEC 2008 AND SMALL PERICARDIAL EFFUSION--   RESOLVED  . Hyperlipidemia   . Hypothyroidism   . IBS (irritable bowel syndrome)   . Migraines   . Pelvic pain   . Spinal stenosis   . Vitamin D deficiency     Past Surgical History:  Procedure Laterality Date  . CARDIAC CATHETERIZATION  10-27-2007  DR Eustace Quail   NORMAL CORONARY ARTERIES/ NORMAL LVF  . CARDIOVASCULAR STRESS TEST  02-25-2010  DR CRENSHAW   ANTERIOR ATTENUATION NO SCAR OR ISCHEMIA/ EF 81%  . CATARACT EXTRACTION W/ INTRAOCULAR LENS  IMPLANT, BILATERAL    . CESAREAN SECTION  1975  . ELBOW TENDON RELEASE Left 1996  . GLAUCOMA SURGERY Bilateral 2004  . RHINOPLASTY  2010  . THUMB TENDON TRANSPOSITION Right 2012  . TONSILLECTOMY  1951  . TOTAL ABDOMINAL HYSTERECTOMY W/ BILATERAL SALPINGOOPHORECTOMY  1982  . TRANSTHORACIC ECHOCARDIOGRAM  03-04-2010   MODERATE LVH/ NORMAL LVSF/ MILD DIASTOLIC DYSFUNCTION/ EF 40%/ MILD AI     Current Outpatient Medications:  .  famciclovir (FAMVIR) 500 MG tablet, Take by mouth 3 (three) times daily., Disp: , Rfl:  .  zolpidem  (AMBIEN) 10 MG tablet, Take 10 mg by mouth at bedtime as needed for sleep., Disp: , Rfl:  .  baclofen (LIORESAL) 10 MG tablet, Take 10 mg by mouth 4 (four) times daily as needed for muscle spasms., Disp: , Rfl:  .  Calcium Carbonate (CALCIUM 600 PO), Take 1,200 mg by mouth daily., Disp: , Rfl:  .  cholecalciferol (VITAMIN D) 1000 units tablet, Take 2,000 Units by mouth daily., Disp: , Rfl:  .  dexlansoprazole (DEXILANT) 60 MG capsule, Take 60 mg by mouth daily., Disp: , Rfl:  .  escitalopram (LEXAPRO) 10 MG tablet, Take 10 mg by mouth daily. , Disp: , Rfl:  .  fexofenadine (ALLEGRA) 180 MG tablet, Take 180 mg by mouth daily., Disp: , Rfl:  .  fluticasone (FLONASE) 50 MCG/ACT nasal spray, Place 1 spray into both nostrils daily as needed for allergies. , Disp: , Rfl:  .  guaiFENesin (MUCINEX) 600 MG 12 hr tablet, Take 600 mg by mouth 2 (two) times daily as needed for cough., Disp: , Rfl:  .  HYDROmorphone (DILAUDID) 4 MG tablet, Take 1 tablet (4 mg total) by mouth every 6 (six) hours as needed for moderate pain or severe pain. (Patient not taking: Reported on 09/22/2017), Disp: 30 tablet, Rfl: 0 .  levothyroxine (SYNTHROID, LEVOTHROID) 125 MCG tablet, Take  125 mcg by mouth daily before breakfast., Disp: , Rfl:  .  meclizine (ANTIVERT) 50 MG tablet, Take 1 tablet (50 mg total) by mouth 3 (three) times daily as needed. (Patient not taking: Reported on 03/30/2016), Disp: 30 tablet, Rfl: 0 .  Multiple Vitamins-Minerals (MULTIVITAMINS THER. W/MINERALS) TABS, Take 1 tablet by mouth daily., Disp: , Rfl:  .  ondansetron (ZOFRAN ODT) 4 MG disintegrating tablet, Take 1 tablet (4 mg total) by mouth every 8 (eight) hours as needed for nausea or vomiting. (Patient not taking: Reported on 09/22/2017), Disp: 20 tablet, Rfl: 0 .  oxyCODONE (OXY IR/ROXICODONE) 5 MG immediate release tablet, Take 5-10 mg by mouth every 4 (four) hours as needed for moderate pain or severe pain., Disp: , Rfl:  .  polyethylene glycol (MIRALAX)  packet, Use three times daily until bowels move - Max 3 consecutive days, Disp: 10 each, Rfl: 0 .  sennosides-docusate sodium (SENOKOT-S) 8.6-50 MG tablet, Take 1 tablet by mouth 2 (two) times daily., Disp: , Rfl:  .  SUMAtriptan (IMITREX) 100 MG tablet, Take 1 tablet (100 mg total) by mouth every 2 (two) hours as needed for migraine. May repeat in 2 hours if headache persists or recurs., Disp: 10 tablet, Rfl: 0 .  telmisartan (MICARDIS) 80 MG tablet, Take 40 mg by mouth daily. , Disp: , Rfl:  .  timolol (TIMOPTIC) 0.5 % ophthalmic solution, Place 1 drop into both eyes daily. , Disp: , Rfl:  .  topiramate (TOPAMAX) 100 MG tablet, Take 100 mg by mouth every evening., Disp: , Rfl:  .  Vitamin D, Ergocalciferol, (DRISDOL) 50000 UNITS CAPS, Take 50,000 Units by mouth every 7 (seven) days. On Wednesdays., Disp: , Rfl:   Allergies  Allergen Reactions  . Adhesive [Tape] Other (See Comments)    BLISTERS  . Aspirin Other (See Comments)    Chest pain  . Avelox [Moxifloxacin] Hives  . Chlorthalidone Other (See Comments)    Unknown   . Hydrochlorothiazide     unknown  . Neomycin Other (See Comments)    EYE IRRITATION (EYE OINTMENT)  . Nsaids Other (See Comments)    Chest pain  . Sulfa Antibiotics Hives    Social History   Socioeconomic History  . Marital status: Married    Spouse name: Not on file  . Number of children: 1  . Years of education: Not on file  . Highest education level: Not on file  Social Needs  . Financial resource strain: Not on file  . Food insecurity - worry: Not on file  . Food insecurity - inability: Not on file  . Transportation needs - medical: Not on file  . Transportation needs - non-medical: Not on file  Occupational History  . Occupation: Retired  Tobacco Use  . Smoking status: Never Smoker  . Smokeless tobacco: Never Used  Substance and Sexual Activity  . Alcohol use: Yes    Alcohol/week: 1.2 oz    Types: 2 Glasses of wine per week  . Drug use: No  .  Sexual activity: Not on file  Other Topics Concern  . Not on file  Social History Narrative  . Not on file        Objective:   Physical Exam  General: AAO x3, NAD  Dermatological: Skin is warm, dry and supple bilateral. Nails x 10 are well manicured; remaining integument appears unremarkable at this time. There are no open sores, no preulcerative lesions, no rash or signs of infection present.  Vascular: Dorsalis  Pedis artery and Posterior Tibial artery pedal pulses are 2/4 bilateral with immedate capillary fill time.  There is no pain with calf compression, swelling, warmth, erythema.   Neruologic: Grossly intact via light touch bilateral. Protective threshold with Semmes Wienstein monofilament intact to all pedal sites bilateral.   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 There is decreased range of motion the first MTPJ. There is tenderness on the dorsal aspect of the first MTPJ. There is slight erythema along the bunion site from irritation site issues there is no swelling. Is no first ray hypermobility present. There is no other areas of tenderness than from this time. Muscular strength 5/5 in all groups tested bilateral.  Gait: Unassisted, Nonantalgic.     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 No evidence of acute fracture identified. Arthritic changes present the first MTPJ. HAV is present. -We discussed both conservative as well as surgical treatment options. She wished off on any surgical intervention. We discussed the steroid injection but she declines this today. Discussed shoe modifications, offloading and also inserts for her shoes.  Celesta Gentile, DPM

## 2017-09-26 DIAGNOSIS — N3000 Acute cystitis without hematuria: Secondary | ICD-10-CM | POA: Diagnosis not present

## 2017-09-26 DIAGNOSIS — N3010 Interstitial cystitis (chronic) without hematuria: Secondary | ICD-10-CM | POA: Diagnosis not present

## 2017-09-26 IMAGING — CR DG ABDOMEN ACUTE W/ 1V CHEST
3 series · 3 of 3 positions shown · non-contrast
Comparison: CT of the abdomen pelvis dated 04/19/2013

CLINICAL DATA: 70-year-old female with constipation.

EXAM:
DG ABDOMEN ACUTE W/ 1V CHEST

[w chest pa]
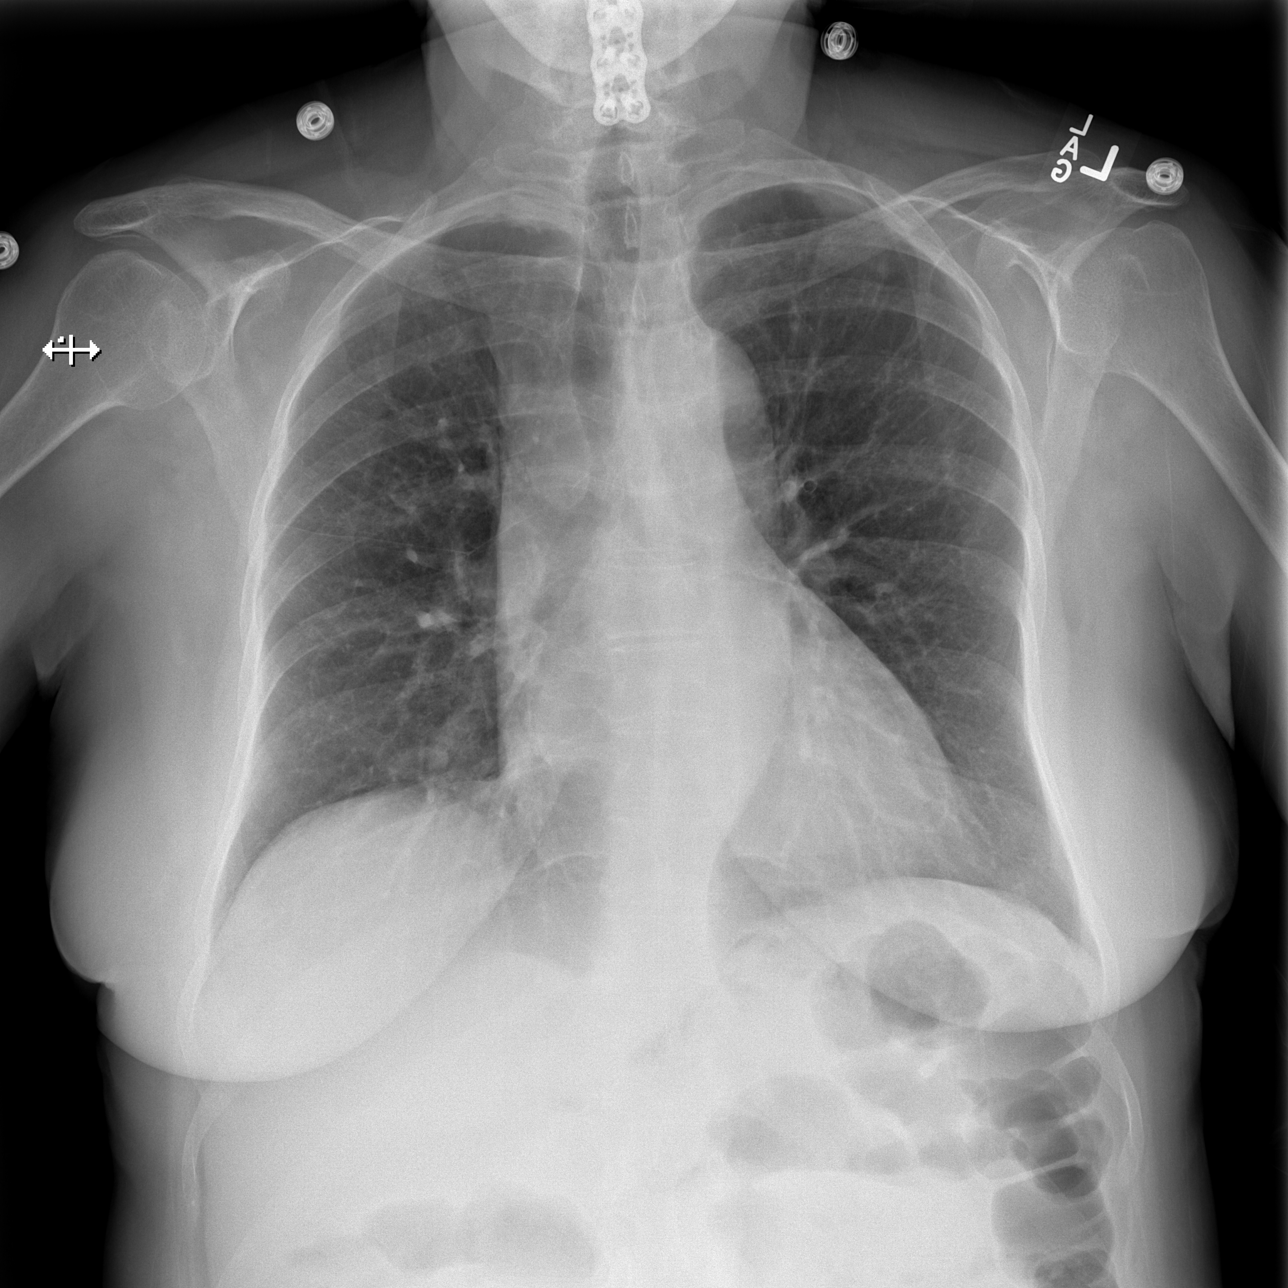

[w abdomen upright]
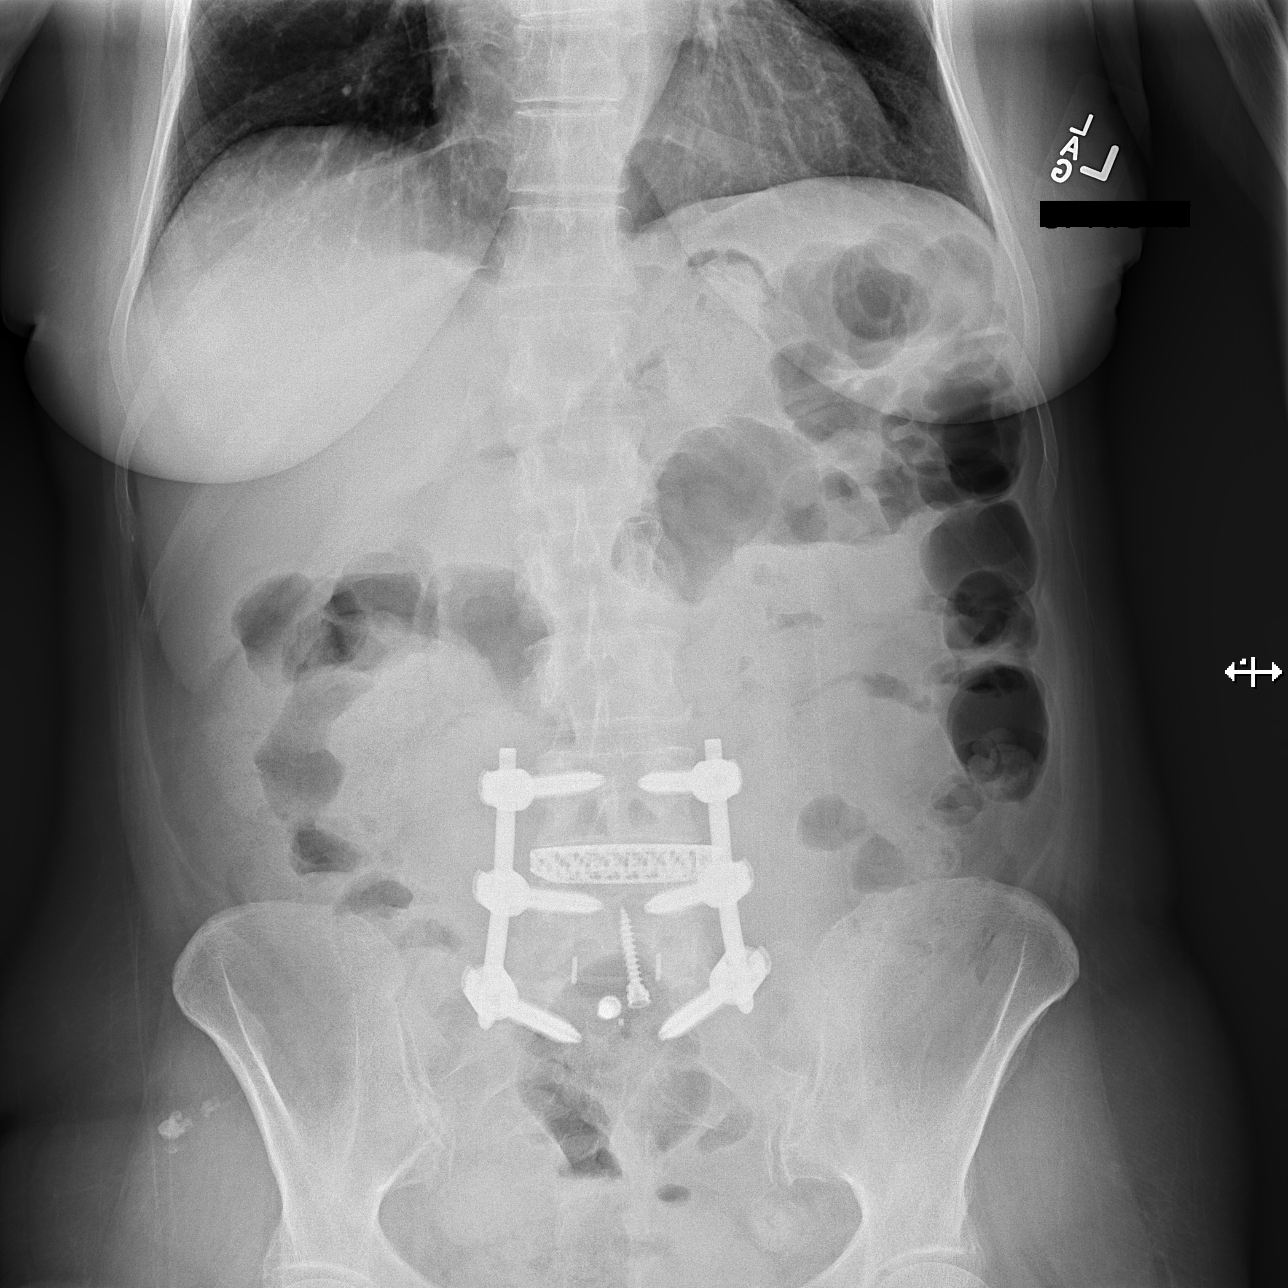

[t abdomen supine]
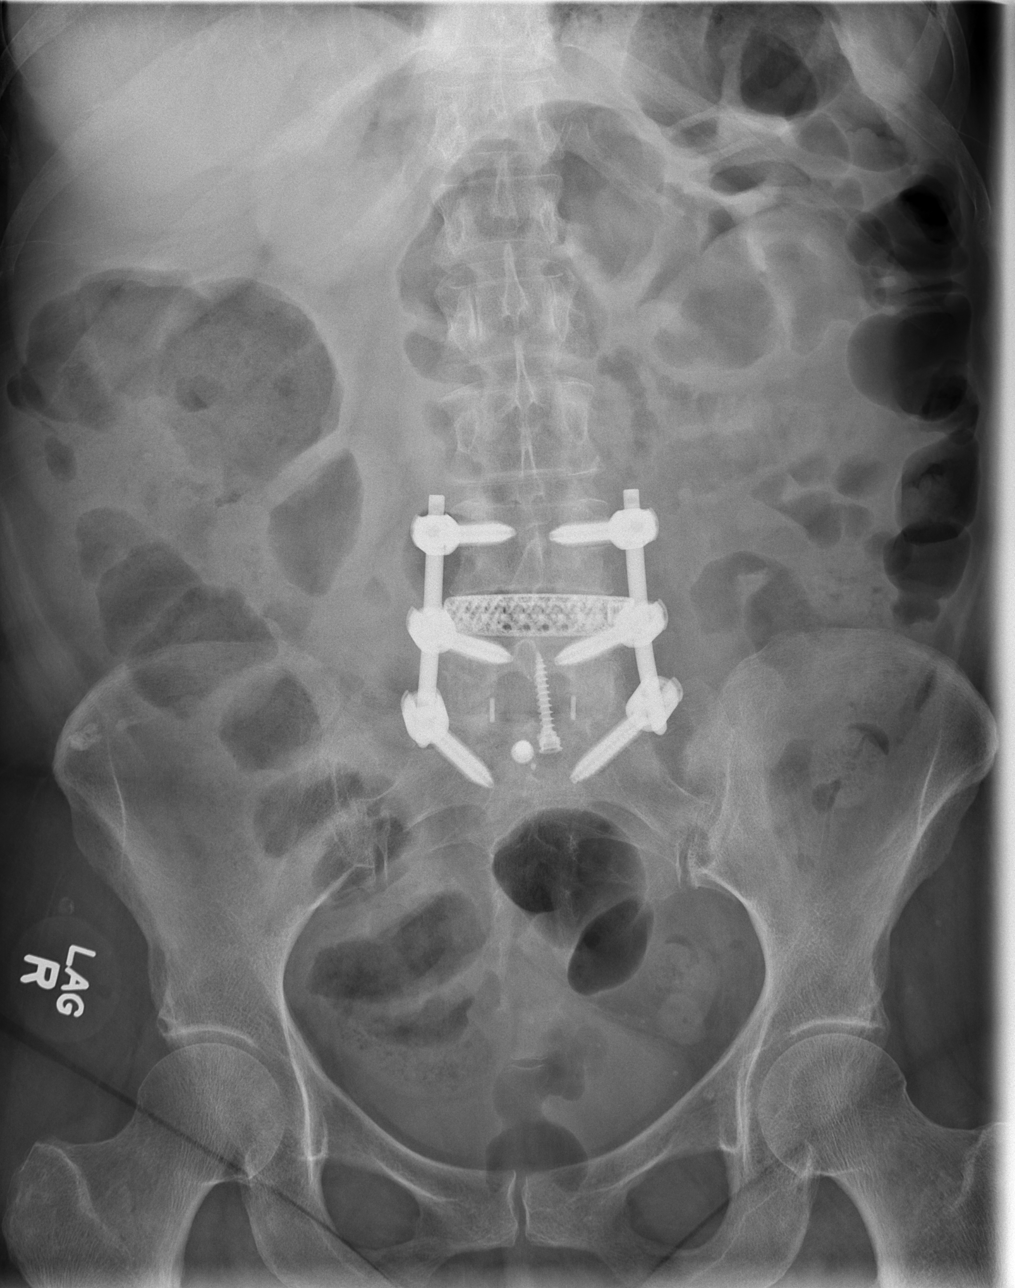

[3 of 3 positions shown; findings below may reference images not displayed]

FINDINGS: The lungs are clear. No pleural effusion or pneumothorax. The
cardiac silhouette is within normal limits. Cervical fusion plate
and screw noted.

Moderate amount of stool noted throughout the colon. No evidence of
bowel obstruction or free air. No radiopaque calculi identified.
L4-L5 disc spacer and lower lumbar fixation hardware noted. No acute
fracture.
IMPRESSION: No acute cardiopulmonary process.

Constipation.  No bowel obstruction.

## 2017-10-04 DIAGNOSIS — J328 Other chronic sinusitis: Secondary | ICD-10-CM | POA: Diagnosis not present

## 2017-10-04 DIAGNOSIS — Z888 Allergy status to other drugs, medicaments and biological substances status: Secondary | ICD-10-CM | POA: Diagnosis not present

## 2017-10-04 DIAGNOSIS — Z981 Arthrodesis status: Secondary | ICD-10-CM | POA: Diagnosis not present

## 2017-10-04 DIAGNOSIS — J329 Chronic sinusitis, unspecified: Secondary | ICD-10-CM | POA: Diagnosis not present

## 2017-10-04 DIAGNOSIS — Z881 Allergy status to other antibiotic agents status: Secondary | ICD-10-CM | POA: Diagnosis not present

## 2017-10-04 DIAGNOSIS — R2689 Other abnormalities of gait and mobility: Secondary | ICD-10-CM | POA: Diagnosis not present

## 2017-10-04 DIAGNOSIS — E039 Hypothyroidism, unspecified: Secondary | ICD-10-CM | POA: Diagnosis not present

## 2017-10-04 DIAGNOSIS — J309 Allergic rhinitis, unspecified: Secondary | ICD-10-CM | POA: Diagnosis not present

## 2017-10-04 DIAGNOSIS — H8101 Meniere's disease, right ear: Secondary | ICD-10-CM | POA: Diagnosis not present

## 2017-10-04 DIAGNOSIS — M48061 Spinal stenosis, lumbar region without neurogenic claudication: Secondary | ICD-10-CM | POA: Diagnosis not present

## 2017-10-04 DIAGNOSIS — M5412 Radiculopathy, cervical region: Secondary | ICD-10-CM | POA: Diagnosis not present

## 2017-10-04 DIAGNOSIS — Z882 Allergy status to sulfonamides status: Secondary | ICD-10-CM | POA: Diagnosis not present

## 2017-10-04 DIAGNOSIS — K219 Gastro-esophageal reflux disease without esophagitis: Secondary | ICD-10-CM | POA: Diagnosis not present

## 2017-10-04 DIAGNOSIS — I10 Essential (primary) hypertension: Secondary | ICD-10-CM | POA: Diagnosis not present

## 2017-10-04 DIAGNOSIS — H9311 Tinnitus, right ear: Secondary | ICD-10-CM | POA: Diagnosis not present

## 2017-10-04 DIAGNOSIS — Z79899 Other long term (current) drug therapy: Secondary | ICD-10-CM | POA: Diagnosis not present

## 2017-10-04 DIAGNOSIS — M25511 Pain in right shoulder: Secondary | ICD-10-CM | POA: Diagnosis not present

## 2017-10-04 DIAGNOSIS — Z886 Allergy status to analgesic agent status: Secondary | ICD-10-CM | POA: Diagnosis not present

## 2017-10-12 ENCOUNTER — Ambulatory Visit (INDEPENDENT_AMBULATORY_CARE_PROVIDER_SITE_OTHER): Payer: Medicare Other | Admitting: Internal Medicine

## 2017-10-12 ENCOUNTER — Encounter: Payer: Self-pay | Admitting: Internal Medicine

## 2017-10-12 VITALS — BP 112/68 | HR 66 | Ht 62.0 in | Wt 138.2 lb

## 2017-10-12 DIAGNOSIS — R194 Change in bowel habit: Secondary | ICD-10-CM

## 2017-10-12 DIAGNOSIS — R140 Abdominal distension (gaseous): Secondary | ICD-10-CM | POA: Diagnosis not present

## 2017-10-12 DIAGNOSIS — Z86010 Personal history of colonic polyps: Secondary | ICD-10-CM

## 2017-10-12 DIAGNOSIS — K219 Gastro-esophageal reflux disease without esophagitis: Secondary | ICD-10-CM | POA: Diagnosis not present

## 2017-10-12 MED ORDER — NA SULFATE-K SULFATE-MG SULF 17.5-3.13-1.6 GM/177ML PO SOLN: 1.0000 | Freq: Once | ORAL | 0 refills | Status: AC

## 2017-10-12 NOTE — Progress Notes (Signed)
HISTORY OF PRESENT ILLNESS:  April Berg is a 71 y.o. female with multiple medical problems who was last evaluated in this office August 2015 for screening colonoscopy and ongoing problems with dysphagia. She also has a history of atypical noncardiac chest pain, and GERD. Last colonoscopy and upper endoscopy were performed October 2015. He was found to have multiple (8) adenomatous polyps with follow-up in 3 years recommended. Upper endoscopy was normal. Empiric esophageal dilation carried out. Some improvement in dysphagia. She continues on Dexilant. Her chief complaint today is change in bowel habits. She tells me that after her spinal surgery in May 2017 she began to develop problems with loose bowels. She had been on opioids. She now takes one half oxycodone twice weekly. More recently has developed constipation. Not using any particular agents. No rectal bleeding. She does have associated abdominal bloating discomfort. Weight is been stable. She is on no fiber. Review of outside blood work reveals unremarkable comprehensive metabolic panel and CBC. CT scan from May 2017 shows postoperative changes from spinal surgery but no intra-abdominal issues.  REVIEW OF SYSTEMS:  All non-GI ROS negative except for sinus and allergy, arthritis, back pain, migraine headaches, hearing problems, urinary frequency (interstitial cystitis)  Past Medical History:  Diagnosis Date  . Arthritis    osteoarthritis  . Complication of anesthesia 90's   block for elbow surgery and pt had a seizure... surgery since with no problem   . Depression   . GERD (gastroesophageal reflux disease)   . Glaucoma of both eyes   . H/O hiatal hernia   . History of pericarditis    DEC 2008 AND SMALL PERICARDIAL EFFUSION--   RESOLVED  . Hyperlipidemia   . Hypothyroidism   . IBS (irritable bowel syndrome)   . Migraines   . Pelvic pain   . Spinal stenosis   . Vitamin D deficiency     Past Surgical History:  Procedure  Laterality Date  . ANTERIOR CERVICAL DECOMP/DISCECTOMY FUSION  12/08/2011   Procedure: ANTERIOR CERVICAL DECOMPRESSION/DISCECTOMY FUSION 3 LEVELS;  Surgeon: Sinclair Ship, MD;  Location: [facility];  Service: Orthopedics;  Laterality: N/A;  C 3-6 ACDF  . CARDIAC CATHETERIZATION  10-27-2007  DR Eustace Quail   NORMAL CORONARY ARTERIES/ NORMAL LVF  . CARDIOVASCULAR STRESS TEST  02-25-2010  DR CRENSHAW   ANTERIOR ATTENUATION NO SCAR OR ISCHEMIA/ EF 81%  . CATARACT EXTRACTION W/ INTRAOCULAR LENS  IMPLANT, BILATERAL    . CESAREAN SECTION  1975  . CYSTO WITH HYDRODISTENSION N/A 06/14/2013   Procedure: CYSTOSCOPY/HYDRODISTENSION/INSTILLATION OF MARCAINE AND PYRIDIUM;  Surgeon: Reece Packer, MD;  Location: [facility];  Service: Urology;  Laterality: N/A;  . ELBOW TENDON RELEASE Left 1996  . GLAUCOMA SURGERY Bilateral 2004  . RHINOPLASTY  2010  . THUMB TENDON TRANSPOSITION Right 2012  . TONSILLECTOMY  1951  . TOTAL ABDOMINAL HYSTERECTOMY W/ BILATERAL SALPINGOOPHORECTOMY  1982  . TRANSTHORACIC ECHOCARDIOGRAM  03-04-2010   MODERATE LVH/ NORMAL LVSF/ MILD DIASTOLIC DYSFUNCTION/ EF 87%/ MILD AI    Social History April Berg  reports that  has never smoked. she has never used smokeless tobacco. She reports that she drinks about 1.2 oz of alcohol per week. She reports that she does not use drugs.  family history includes Lung disease in her father; Pulmonary fibrosis in her mother.  Allergies  Allergen Reactions  . Adhesive [Tape] Other (See Comments)    BLISTERS  . Aspirin Other (See Comments)    Chest pain  .  Avelox [Moxifloxacin] Hives  . Chlorthalidone Other (See Comments)    Unknown   . Hydrochlorothiazide     unknown  . Neomycin Other (See Comments)    EYE IRRITATION (EYE OINTMENT)  . Nsaids Other (See Comments)    Chest pain  . Sulfa Antibiotics Hives       PHYSICAL EXAMINATION: Vital signs: BP 112/68   Pulse 66   Ht 5\' 2"  (1.575 m)   Wt 138 lb 4 oz  (62.7 kg)   BMI 25.29 kg/m   Constitutional: generally well-appearing, no acute distress Psychiatric: alert and oriented x3, cooperative Eyes: extraocular movements intact, anicteric, conjunctiva pink Mouth: oral pharynx moist, no lesions Neck: supple no lymphadenopathy Cardiovascular: heart regular rate and rhythm, no murmur Lungs: clear to auscultation bilaterally Abdomen: soft, nontender, nondistended, no obvious ascites, no peritoneal signs, normal bowel sounds, no organomegaly Rectal: Deferred until colonoscopy Extremities: no clubbing, cyanosis, or lower extremity edema bilaterally Skin: no lesions on visible extremities Neuro: No focal deficits. Cranial nerves intact  ASSESSMENT:  #1. Change in bowel habits as described. Likely related to opioids #2. History of multiple adenomatous colon polyps. October 2015. Due for surveillance #3. GERD. Classic symptoms controlled on PPI   PLAN:  #1. Colonoscopy to provide surveillance and addressed bowel habit change.The nature of the procedure, as well as the risks, benefits, and alternatives were carefully and thoroughly reviewed with the patient. Ample time for discussion and questions allowed. The patient understood, was satisfied, and agreed to proceed. #2. Daily fiber supplementation #3. MiraLAX for constipation #4. Reflux precautions #5. Continue PPI to control GERD symptoms. Lowest effective dose recommended #6. Routine office follow-up annually, or sooner if needed

## 2017-10-12 NOTE — Patient Instructions (Signed)

## 2017-10-18 DIAGNOSIS — N3010 Interstitial cystitis (chronic) without hematuria: Secondary | ICD-10-CM | POA: Diagnosis not present

## 2017-10-24 ENCOUNTER — Other Ambulatory Visit: Payer: Self-pay | Admitting: Internal Medicine

## 2017-10-24 DIAGNOSIS — Z1231 Encounter for screening mammogram for malignant neoplasm of breast: Secondary | ICD-10-CM

## 2017-10-26 DIAGNOSIS — R350 Frequency of micturition: Secondary | ICD-10-CM | POA: Diagnosis not present

## 2017-10-26 DIAGNOSIS — N3010 Interstitial cystitis (chronic) without hematuria: Secondary | ICD-10-CM | POA: Diagnosis not present

## 2017-11-07 DIAGNOSIS — N281 Cyst of kidney, acquired: Secondary | ICD-10-CM | POA: Diagnosis not present

## 2017-11-07 DIAGNOSIS — N3010 Interstitial cystitis (chronic) without hematuria: Secondary | ICD-10-CM | POA: Diagnosis not present

## 2017-11-28 ENCOUNTER — Ambulatory Visit: Payer: Medicare Other

## 2017-12-06 ENCOUNTER — Encounter: Payer: Self-pay | Admitting: Internal Medicine

## 2017-12-06 ENCOUNTER — Other Ambulatory Visit: Payer: Self-pay

## 2017-12-06 ENCOUNTER — Ambulatory Visit (AMBULATORY_SURGERY_CENTER): Payer: Medicare Other | Admitting: Internal Medicine

## 2017-12-06 VITALS — BP 119/44 | HR 66 | Temp 95.0°F | Resp 14 | Ht 62.0 in | Wt 138.0 lb

## 2017-12-06 DIAGNOSIS — R194 Change in bowel habit: Secondary | ICD-10-CM

## 2017-12-06 DIAGNOSIS — D122 Benign neoplasm of ascending colon: Secondary | ICD-10-CM | POA: Diagnosis not present

## 2017-12-06 DIAGNOSIS — Z86010 Personal history of colonic polyps: Secondary | ICD-10-CM

## 2017-12-06 MED ORDER — SODIUM CHLORIDE 0.9 % IV SOLN: 500.0000 mL | Freq: Once | INTRAVENOUS | Status: DC

## 2017-12-06 NOTE — Progress Notes (Signed)
Pt. Stated that" I did not bring my calender so I will have to call back to schedule upper endoscopy.

## 2017-12-06 NOTE — Progress Notes (Signed)
Called to room to assist during endoscopic procedure.  Patient ID and intended procedure confirmed with present staff. Received instructions for my participation in the procedure from the performing physician.  

## 2017-12-06 NOTE — Progress Notes (Signed)
Patient consents to observer being present for procedure.   

## 2017-12-06 NOTE — Progress Notes (Signed)
To Pacu, VSS. Report to [email or web address] 

## 2017-12-06 NOTE — Op Note (Signed)
[place] Patient Name: April Berg Procedure Date: 12/06/2017 2:44 PM MRN: [id number] Endoscopist: Docia Chuck. Henrene Pastor , MD Age: 72 Referring MD:  Date of Birth: [birth year] Gender: Female Account #: [id number] Procedure:                Colonoscopy, With cold snare polypectomy x 1 Indications:              Surveillance: Personal history of adenomatous                            polyps on last colonoscopy 3 years ago, High risk                            colon cancer surveillance: Personal history of                            multiple (3 or more) adenomas Medicines:                Monitored Anesthesia Care Procedure:                Pre-Anesthesia Assessment:                           - Prior to the procedure, a History and Physical                            was performed, and patient medications and                            allergies were reviewed. The patient's tolerance of                            previous anesthesia was also reviewed. The risks                            and benefits of the procedure and the sedation                            options and risks were discussed with the patient.                            All questions were answered, and informed consent                            was obtained. Prior Anticoagulants: The patient has                            taken no previous anticoagulant or antiplatelet                            agents. ASA Grade Assessment: II - A patient with                            mild systemic disease. After reviewing the risks  and benefits, the patient was deemed in                            satisfactory condition to undergo the procedure.                           After obtaining informed consent, the colonoscope                            was passed under direct vision. Throughout the                            procedure, the patient's blood pressure, pulse, and                            oxygen  saturations were monitored continuously. The                            Colonoscope was introduced through the anus and                            advanced to the the cecum, identified by                            appendiceal orifice and ileocecal valve. The                            ileocecal valve, appendiceal orifice, and rectum                            were photographed. The quality of the bowel                            preparation was excellent. The colonoscopy was                            performed without difficulty. The patient tolerated                            the procedure well. The bowel preparation used was                            SUPREP. Scope In: 2:52:41 PM Scope Out: 3:07:59 PM Scope Withdrawal Time: 0 hours 10 minutes 37 seconds  Total Procedure Duration: 0 hours 15 minutes 18 seconds  Findings:                 Two polyps were found in the ascending colon. The                            polyps were 1 to 2 mm in size. These polyps were                            removed with a cold snare. Resection and retrieval  were complete.                           Internal hemorrhoids were found during retroflexion.                           The exam was otherwise without abnormality on                            direct and retroflexion views. Complications:            No immediate complications. Estimated blood loss:                            None. Estimated Blood Loss:     Estimated blood loss: none. Impression:               - Two 1 to 2 mm polyps in the ascending colon,                            removed with a cold snare. Resected and retrieved.                           - Internal hemorrhoids.                           - The examination was otherwise normal on direct                            and retroflexion views.                           NOTE: Patient complains of recurrent epigastric                            pain. Not previously  evaluated Recommendation:           - Repeat colonoscopy in 5 years for surveillance.                           - Patient has a contact number available for                            emergencies. The signs and symptoms of potential                            delayed complications were discussed with the                            patient. Return to normal activities tomorrow.                            Written discharge instructions were provided to the                            patient.                           -  Resume previous diet.                           - Continue present medications. Increase MiraLAX to                            achieve desired result.                           - Await pathology results.                           Note: 1. Schedule abdominal ultrasound "epigastric                            pain"                           2. Schedule upper endoscopy in the [place] to evaluate                            "epigastric pain" Docia Chuck. Henrene Pastor, MD 12/06/2017 3:12:32 PM This report has been signed electronically.

## 2017-12-06 NOTE — Patient Instructions (Signed)
YOU HAD AN ENDOSCOPIC PROCEDURE TODAY AT [facility]:   Refer to the procedure report that was given to you for any specific questions about what was found during the examination.  If the procedure report does not answer your questions, please call your gastroenterologist to clarify.  If you requested that your care partner not be given the details of your procedure findings, then the procedure report has been included in a sealed envelope for you to review at your convenience later.  YOU SHOULD EXPECT: Some feelings of bloating in the abdomen. Passage of more gas than usual.  Walking can help get rid of the air that was put into your GI tract during the procedure and reduce the bloating. If you had a lower endoscopy (such as a colonoscopy or flexible sigmoidoscopy) you may notice spotting of blood in your stool or on the toilet paper. If you underwent a bowel prep for your procedure, you may not have a normal bowel movement for a few days.  Please Note:  You might notice some irritation and congestion in your nose or some drainage.  This is from the oxygen used during your procedure.  There is no need for concern and it should clear up in a day or so.  SYMPTOMS TO REPORT IMMEDIATELY:   Following lower endoscopy (colonoscopy or flexible sigmoidoscopy):  Excessive amounts of blood in the stool  Significant tenderness or worsening of abdominal pains  Swelling of the abdomen that is new, acute  Fever of 100F or higher    For urgent or emergent issues, a gastroenterologist can be reached at any hour by calling [phone].   DIET:  We do recommend a small meal at first, but then you may proceed to your regular diet.  Drink plenty of fluids but you should avoid alcoholic beverages for 24 hours.  ACTIVITY:  You should plan to take it easy for the rest of today and you should NOT DRIVE or use heavy machinery until tomorrow (because of the sedation medicines used during the test).     FOLLOW UP: Our staff will call the number listed on your records the next business day following your procedure to check on you and address any questions or concerns that you may have regarding the information given to you following your procedure. If we do not reach you, we will leave a message.  However, if you are feeling well and you are not experiencing any problems, there is no need to return our call.  We will assume that you have returned to your regular daily activities without incident.  If any biopsies were taken you will be contacted by phone or by letter within the next 1-3 weeks.  Please call us at [phone] if you have not heard about the biopsies in 3 weeks.    SIGNATURES/CONFIDENTIALITY: You and/or your care partner have signed paperwork which will be entered into your electronic medical record.  These signatures attest to the fact that that the information above on your After Visit Summary has been reviewed and is understood.  Full responsibility of the confidentiality of this discharge information lies with you and/or your care-partner.   Resume medications. Information given on polyps and hemorrhoids. Office will schedule Ultrasound and notify you with appointment. Please call back to schedule EGD when you check schedule.

## 2017-12-07 ENCOUNTER — Other Ambulatory Visit: Payer: Self-pay

## 2017-12-07 ENCOUNTER — Telehealth: Payer: Self-pay | Admitting: *Deleted

## 2017-12-07 ENCOUNTER — Telehealth: Payer: Self-pay

## 2017-12-07 DIAGNOSIS — R1013 Epigastric pain: Secondary | ICD-10-CM

## 2017-12-07 NOTE — Telephone Encounter (Signed)
  Follow up Call-  Call back number 12/06/2017  Post procedure Call Back phone  # [phone]  Permission to leave phone message Yes  Some recent data might be hidden     Patient questions:  Do you have a fever, pain , or abdominal swelling? No. Pain Score  0 *  Have you tolerated food without any problems? Yes.    Have you been able to return to your normal activities? Yes.    Do you have any questions about your discharge instructions: Diet   No. Medications  No. Follow up visit  Yes  Do you have questions or concerns about your Care? No.  Actions: * If pain score is 4 or above: No action needed, pain <4. Pt. Wanted to know about scheduling for endoscopy and ultrasound.  She was advised that staff from Dr. Blanch Media office would be in touch with her to schedule both procedures.  She will contact Dr. Blanch Media office Friday if she has not heard from them by then.  I was advised by Pollyann Kennedy RN that she did not want to  schedule endoscopy yesterday because she didn't have her calendar available for dates.

## 2017-12-07 NOTE — Telephone Encounter (Signed)
Pt scheduled for [place] of abdomen at [facility] 12/09/17@7am , pt to arrive there at 6:45am. Pt scheduled for previsit 12/14/17 at 10am, Propofol EGD scheduled in the [facility] 12/20/17@9am . Pt aware of appts.

## 2017-12-09 ENCOUNTER — Ambulatory Visit (HOSPITAL_COMMUNITY)
Admission: RE | Admit: 2017-12-09 | Discharge: 2017-12-09 | Disposition: A | Discharge status: Home / Self Care | Payer: Medicare Other | Source: Ambulatory Visit | Attending: Internal Medicine | Admitting: Internal Medicine

## 2017-12-09 DIAGNOSIS — N281 Cyst of kidney, acquired: Secondary | ICD-10-CM | POA: Diagnosis not present

## 2017-12-09 DIAGNOSIS — R1013 Epigastric pain: Secondary | ICD-10-CM | POA: Diagnosis not present

## 2017-12-13 ENCOUNTER — Encounter: Payer: Self-pay | Admitting: Internal Medicine

## 2017-12-14 ENCOUNTER — Other Ambulatory Visit: Payer: Self-pay

## 2017-12-14 ENCOUNTER — Ambulatory Visit (AMBULATORY_SURGERY_CENTER): Payer: Self-pay | Admitting: *Deleted

## 2017-12-14 VITALS — Ht 62.0 in | Wt 139.0 lb

## 2017-12-14 DIAGNOSIS — K219 Gastro-esophageal reflux disease without esophagitis: Secondary | ICD-10-CM

## 2017-12-14 NOTE — Progress Notes (Signed)
Patient denies any allergies to eggs or soy. Patient denies any problems with anesthesia/sedation. Patient denies any oxygen use at home. Patient denies taking any diet/weight loss medications or blood thinners. EMMI education declined by pt. Patient states no changes in medical,surgical or medicines since procedure  With Dr.Perry on 12-06-17.

## 2017-12-15 ENCOUNTER — Ambulatory Visit
Admission: RE | Admit: 2017-12-15 | Discharge: 2017-12-15 | Disposition: A | Discharge status: Home / Self Care | Payer: Medicare Other | Source: Ambulatory Visit | Attending: Internal Medicine | Admitting: Internal Medicine

## 2017-12-15 DIAGNOSIS — Z1231 Encounter for screening mammogram for malignant neoplasm of breast: Secondary | ICD-10-CM

## 2017-12-20 ENCOUNTER — Ambulatory Visit (AMBULATORY_SURGERY_CENTER): Payer: Medicare Other | Admitting: Internal Medicine

## 2017-12-20 ENCOUNTER — Other Ambulatory Visit: Payer: Self-pay

## 2017-12-20 ENCOUNTER — Encounter: Payer: Self-pay | Admitting: Internal Medicine

## 2017-12-20 VITALS — BP 127/61 | HR 64 | Temp 98.2°F | Resp 9 | Ht 62.0 in | Wt 139.0 lb

## 2017-12-20 DIAGNOSIS — K222 Esophageal obstruction: Secondary | ICD-10-CM | POA: Diagnosis not present

## 2017-12-20 DIAGNOSIS — R1310 Dysphagia, unspecified: Secondary | ICD-10-CM

## 2017-12-20 DIAGNOSIS — K219 Gastro-esophageal reflux disease without esophagitis: Secondary | ICD-10-CM | POA: Diagnosis not present

## 2017-12-20 DIAGNOSIS — R1319 Other dysphagia: Secondary | ICD-10-CM

## 2017-12-20 MED ORDER — SODIUM CHLORIDE 0.9 % IV SOLN: 500.0000 mL | Freq: Once | INTRAVENOUS | Status: AC

## 2017-12-20 NOTE — Progress Notes (Signed)
Pt's states no medical or surgical changes since previsit or office visit. 

## 2017-12-20 NOTE — Op Note (Signed)
[facility] Patient Name: April Berg Procedure Date: 12/20/2017 9:08 AM MRN: [id number] Endoscopist: Docia Chuck. Henrene Pastor , MD Age: 72 Referring MD:  Date of Birth: 06-17-1946 Gender: Female Account #: [id number] Procedure:                Upper GI endoscopy, with Venia Minks dilation of the                            esophagus?"20F Indications:              Dysphagia, Epigastric abdominal pain, Esophageal                            reflux Medicines:                Monitored Anesthesia Care Procedure:                Pre-Anesthesia Assessment:                           - Prior to the procedure, a History and Physical                            was performed, and patient medications and                            allergies were reviewed. The patient's tolerance of                            previous anesthesia was also reviewed. The risks                            and benefits of the procedure and the sedation                            options and risks were discussed with the patient.                            All questions were answered, and informed consent                            was obtained. Prior Anticoagulants: The patient has                            taken no previous anticoagulant or antiplatelet                            agents. After reviewing the risks and benefits, the                            patient was deemed in satisfactory condition to                            undergo the procedure.  After obtaining informed consent, the endoscope was                            passed under direct vision. Throughout the                            procedure, the patient's blood pressure, pulse, and                            oxygen saturations were monitored continuously. The                            Endoscope was introduced through the mouth, and                            advanced to the second part of duodenum. The upper          GI endoscopy was accomplished without difficulty.                            The patient tolerated the procedure well. Scope In: Scope Out: Findings:                 One mild benign-appearing, intrinsic stenosis was                            found 40 cm from the incisors. The scope was                            withdrawn. Dilation was performed with a Maloney                            dilator with no resistance at 15 Fr.                           The exam of the esophagus was otherwise normal.                           The stomach revealed atrophic gastric mucosa but                            was otherwise normal.                           The examined duodenum was normal.                           The cardia and gastric fundus were normal on                            retroflexion. Complications:            No immediate complications. Estimated Blood Loss:     Estimated blood loss: none. Impression:               - Benign-appearing esophageal stenosis. Dilated.                           -  Otherwise normal EGD. Recommendation:           - Patient has a contact number available for                            emergencies. The signs and symptoms of potential                            delayed complications were discussed with the                            patient. Return to normal activities tomorrow.                            Written discharge instructions were provided to the                            patient.                           - Post dilation diet.                           - Continue present medications.                           - Contrast-enhanced CT scan of the abdomen and                            pelvis "epigastric pain of 6 months duration"                           - Routine office follow-up with Dr. Henrene Pastor in 6-8                            weeks Docia Chuck. Henrene Pastor, MD 12/20/2017 9:31:03 AM This report has been signed electronically.

## 2017-12-20 NOTE — Progress Notes (Signed)
A and O x3. Report to RN. Tolerated MAC anesthesia well.Teeth unchanged after procedure.

## 2017-12-20 NOTE — Patient Instructions (Signed)
YOU HAD AN ENDOSCOPIC PROCEDURE TODAY AT [facility]:   Refer to the procedure report that was given to you for any specific questions about what was found during the examination.  If the procedure report does not answer your questions, please call your gastroenterologist to clarify.  If you requested that your care partner not be given the details of your procedure findings, then the procedure report has been included in a sealed envelope for you to review at your convenience later.  YOU SHOULD EXPECT: Some feelings of bloating in the abdomen. Passage of more gas than usual.  Walking can help get rid of the air that was put into your GI tract during the procedure and reduce the bloating. If you had a lower endoscopy (such as a colonoscopy or flexible sigmoidoscopy) you may notice spotting of blood in your stool or on the toilet paper. If you underwent a bowel prep for your procedure, you may not have a normal bowel movement for a few days.  Please Note:  You might notice some irritation and congestion in your nose or some drainage.  This is from the oxygen used during your procedure.  There is no need for concern and it should clear up in a day or so.  SYMPTOMS TO REPORT IMMEDIATELY:   Following upper endoscopy (EGD)  Vomiting of blood or coffee ground material  New chest pain or pain under the shoulder blades  Painful or persistently difficult swallowing  New shortness of breath  Fever of 100F or higher  Black, tarry-looking stools  For urgent or emergent issues, a gastroenterologist can be reached at any hour by calling [phone].   DIET:  Follow a post-dilation diet (clear liquids starting at 10:30am for one hour, soft diet starting at 11:30am for the remainder of today, resume regular diet tomorrow as tolerated). Please see post-dilation diet handout given to you by your recovery nurse.  Drink plenty of fluids but you should avoid alcoholic beverages for 24  hours.  MEDICATIONS: Continue present medications.  FOLLOW UP: Contrast-enhanced CT scan of the abdomen and pelvis "epigastric pain of 6 months duration". Linda from Dr. Blanch Media office will call you later today or tomorrow to schedule that appointment. 2 bottles of contrast media given to patient prior to discharge. Routine office follow-up with Dr. Henrene Pastor in 6-8 weeks.  ACTIVITY:  You should plan to take it easy for the rest of today and you should NOT DRIVE or use heavy machinery until tomorrow (because of the sedation medicines used during the test).    FOLLOW UP: Our staff will call the number listed on your records the next business day following your procedure to check on you and address any questions or concerns that you may have regarding the information given to you following your procedure. If we do not reach you, we will leave a message.  However, if you are feeling well and you are not experiencing any problems, there is no need to return our call.  We will assume that you have returned to your regular daily activities without incident.  If any biopsies were taken you will be contacted by phone or by letter within the next 1-3 weeks.  Please call us at [phone] if you have not heard about the biopsies in 3 weeks.   Thank you for allowing [place] to provide for your healthcare today.  SIGNATURES/CONFIDENTIALITY: You and/or your care partner have signed paperwork which will be entered into your electronic medical  record.  These signatures attest to the fact that that the information above on your After Visit Summary has been reviewed and is understood.  Full responsibility of the confidentiality of this discharge information lies with you and/or your care-partner. 

## 2017-12-20 NOTE — Progress Notes (Signed)
Called to room to assist during endoscopic procedure.  Patient ID and intended procedure confirmed with present staff. Received instructions for my participation in the procedure from the performing physician.  

## 2017-12-21 ENCOUNTER — Other Ambulatory Visit: Payer: Self-pay

## 2017-12-21 ENCOUNTER — Telehealth: Payer: Self-pay | Admitting: *Deleted

## 2017-12-21 ENCOUNTER — Telehealth: Payer: Self-pay

## 2017-12-21 DIAGNOSIS — R1013 Epigastric pain: Secondary | ICD-10-CM

## 2017-12-21 NOTE — Telephone Encounter (Signed)
Left message on 2nd f/u call 

## 2017-12-21 NOTE — Telephone Encounter (Signed)
Pt scheduled for CT of A/P at [facility] 01/03/18@10 :30am, pt to arrive there at 10:15am. Pt to be NPO after 6:30am except for bottle 1 of contrast at 8:30am and bottle 2 at 9:30am. Pt to come to our lab for BMET prior to CT scan. Pt scheduled to see Dr. Henrene Pastor 02/06/18@10am . Left message for pt to call back.

## 2017-12-21 NOTE — Telephone Encounter (Signed)
Left message on f/u call 

## 2017-12-22 NOTE — Telephone Encounter (Signed)
Left message for pt to call back  °

## 2017-12-23 NOTE — Telephone Encounter (Signed)
Left detailed message for pt on voicemail. Pt has not returned 2 previous calls.

## 2017-12-26 NOTE — Telephone Encounter (Signed)
Have been unable to reach pt by phone after leaving multiple messages. Appointment information mailed to pt regarding CT scan, instructions, labs, and follow-up appointment.

## 2018-01-03 ENCOUNTER — Inpatient Hospital Stay: Admission: RE | Admit: 2018-01-03 | Payer: Medicare Other | Source: Ambulatory Visit

## 2018-01-03 NOTE — Telephone Encounter (Signed)
Pt states she did not receive any messages regarding her CT appt. Pt was left multiple voicemail messages and  also sent a letter. Pt states she just got the letter yesterday. Pt does not want to have CT scan. Appt cancelled. Dr. Henrene Pastor notified.

## 2018-01-13 DIAGNOSIS — N3010 Interstitial cystitis (chronic) without hematuria: Secondary | ICD-10-CM | POA: Diagnosis not present

## 2018-01-13 DIAGNOSIS — R102 Pelvic and perineal pain: Secondary | ICD-10-CM | POA: Diagnosis not present

## 2018-01-18 DIAGNOSIS — Z981 Arthrodesis status: Secondary | ICD-10-CM | POA: Diagnosis not present

## 2018-01-18 DIAGNOSIS — M5412 Radiculopathy, cervical region: Secondary | ICD-10-CM | POA: Diagnosis not present

## 2018-01-18 DIAGNOSIS — M542 Cervicalgia: Secondary | ICD-10-CM | POA: Diagnosis not present

## 2018-01-18 DIAGNOSIS — M25511 Pain in right shoulder: Secondary | ICD-10-CM | POA: Diagnosis not present

## 2018-01-23 DIAGNOSIS — H401132 Primary open-angle glaucoma, bilateral, moderate stage: Secondary | ICD-10-CM | POA: Diagnosis not present

## 2018-01-23 DIAGNOSIS — D3132 Benign neoplasm of left choroid: Secondary | ICD-10-CM | POA: Diagnosis not present

## 2018-01-23 DIAGNOSIS — H35373 Puckering of macula, bilateral: Secondary | ICD-10-CM | POA: Diagnosis not present

## 2018-02-06 ENCOUNTER — Ambulatory Visit: Payer: Medicare Other | Admitting: Internal Medicine

## 2018-02-06 DIAGNOSIS — G43719 Chronic migraine without aura, intractable, without status migrainosus: Secondary | ICD-10-CM | POA: Diagnosis not present

## 2018-02-06 DIAGNOSIS — G43019 Migraine without aura, intractable, without status migrainosus: Secondary | ICD-10-CM | POA: Diagnosis not present

## 2018-02-06 DIAGNOSIS — G43809 Other migraine, not intractable, without status migrainosus: Secondary | ICD-10-CM | POA: Diagnosis not present

## 2018-02-08 DIAGNOSIS — H9190 Unspecified hearing loss, unspecified ear: Secondary | ICD-10-CM | POA: Diagnosis not present

## 2018-02-08 DIAGNOSIS — R509 Fever, unspecified: Secondary | ICD-10-CM | POA: Diagnosis not present

## 2018-02-08 DIAGNOSIS — N3010 Interstitial cystitis (chronic) without hematuria: Secondary | ICD-10-CM | POA: Diagnosis not present

## 2018-02-13 DIAGNOSIS — Z822 Family history of deafness and hearing loss: Secondary | ICD-10-CM | POA: Diagnosis not present

## 2018-02-13 DIAGNOSIS — H90A22 Sensorineural hearing loss, unilateral, left ear, with restricted hearing on the contralateral side: Secondary | ICD-10-CM | POA: Diagnosis not present

## 2018-02-13 DIAGNOSIS — H90A31 Mixed conductive and sensorineural hearing loss, unilateral, right ear with restricted hearing on the contralateral side: Secondary | ICD-10-CM | POA: Diagnosis not present

## 2018-02-17 DIAGNOSIS — J3489 Other specified disorders of nose and nasal sinuses: Secondary | ICD-10-CM | POA: Diagnosis not present

## 2018-02-17 DIAGNOSIS — R200 Anesthesia of skin: Secondary | ICD-10-CM | POA: Diagnosis not present

## 2018-02-17 DIAGNOSIS — J309 Allergic rhinitis, unspecified: Secondary | ICD-10-CM | POA: Diagnosis not present

## 2018-02-17 DIAGNOSIS — J328 Other chronic sinusitis: Secondary | ICD-10-CM | POA: Diagnosis not present

## 2018-02-17 DIAGNOSIS — R208 Other disturbances of skin sensation: Secondary | ICD-10-CM | POA: Diagnosis not present

## 2018-02-17 DIAGNOSIS — Z8709 Personal history of other diseases of the respiratory system: Secondary | ICD-10-CM | POA: Diagnosis not present

## 2018-03-21 DIAGNOSIS — M961 Postlaminectomy syndrome, not elsewhere classified: Secondary | ICD-10-CM | POA: Diagnosis not present

## 2018-03-21 DIAGNOSIS — M5416 Radiculopathy, lumbar region: Secondary | ICD-10-CM | POA: Diagnosis not present

## 2018-03-21 DIAGNOSIS — M47812 Spondylosis without myelopathy or radiculopathy, cervical region: Secondary | ICD-10-CM | POA: Diagnosis not present

## 2018-03-21 DIAGNOSIS — G894 Chronic pain syndrome: Secondary | ICD-10-CM | POA: Diagnosis not present

## 2018-03-22 DIAGNOSIS — M25512 Pain in left shoulder: Secondary | ICD-10-CM | POA: Insufficient documentation

## 2018-03-22 DIAGNOSIS — M25511 Pain in right shoulder: Secondary | ICD-10-CM | POA: Insufficient documentation

## 2018-04-27 DIAGNOSIS — D3132 Benign neoplasm of left choroid: Secondary | ICD-10-CM | POA: Diagnosis not present

## 2018-04-27 DIAGNOSIS — H35372 Puckering of macula, left eye: Secondary | ICD-10-CM | POA: Diagnosis not present

## 2018-04-27 DIAGNOSIS — Z961 Presence of intraocular lens: Secondary | ICD-10-CM | POA: Diagnosis not present

## 2018-04-27 DIAGNOSIS — H353132 Nonexudative age-related macular degeneration, bilateral, intermediate dry stage: Secondary | ICD-10-CM | POA: Diagnosis not present

## 2018-05-10 DIAGNOSIS — M79642 Pain in left hand: Secondary | ICD-10-CM | POA: Insufficient documentation

## 2018-05-10 DIAGNOSIS — S61432A Puncture wound without foreign body of left hand, initial encounter: Secondary | ICD-10-CM | POA: Diagnosis not present

## 2018-05-10 DIAGNOSIS — M13842 Other specified arthritis, left hand: Secondary | ICD-10-CM | POA: Diagnosis not present

## 2018-05-11 DIAGNOSIS — M25511 Pain in right shoulder: Secondary | ICD-10-CM | POA: Diagnosis not present

## 2018-05-11 DIAGNOSIS — M25512 Pain in left shoulder: Secondary | ICD-10-CM | POA: Diagnosis not present

## 2018-05-16 DIAGNOSIS — M79644 Pain in right finger(s): Secondary | ICD-10-CM | POA: Diagnosis not present

## 2018-05-16 DIAGNOSIS — Z981 Arthrodesis status: Secondary | ICD-10-CM | POA: Diagnosis not present

## 2018-05-16 DIAGNOSIS — M25511 Pain in right shoulder: Secondary | ICD-10-CM | POA: Diagnosis not present

## 2018-05-16 DIAGNOSIS — M542 Cervicalgia: Secondary | ICD-10-CM | POA: Diagnosis not present

## 2018-06-21 DIAGNOSIS — M48062 Spinal stenosis, lumbar region with neurogenic claudication: Secondary | ICD-10-CM | POA: Diagnosis not present

## 2018-07-12 DIAGNOSIS — N3010 Interstitial cystitis (chronic) without hematuria: Secondary | ICD-10-CM | POA: Diagnosis not present

## 2018-07-29 DIAGNOSIS — Z23 Encounter for immunization: Secondary | ICD-10-CM | POA: Diagnosis not present

## 2018-08-03 DIAGNOSIS — D3132 Benign neoplasm of left choroid: Secondary | ICD-10-CM | POA: Diagnosis not present

## 2018-08-03 DIAGNOSIS — H52203 Unspecified astigmatism, bilateral: Secondary | ICD-10-CM | POA: Diagnosis not present

## 2018-08-03 DIAGNOSIS — H401132 Primary open-angle glaucoma, bilateral, moderate stage: Secondary | ICD-10-CM | POA: Diagnosis not present

## 2018-08-03 DIAGNOSIS — Z961 Presence of intraocular lens: Secondary | ICD-10-CM | POA: Diagnosis not present

## 2018-08-11 DIAGNOSIS — G43809 Other migraine, not intractable, without status migrainosus: Secondary | ICD-10-CM | POA: Diagnosis not present

## 2018-08-11 DIAGNOSIS — G43719 Chronic migraine without aura, intractable, without status migrainosus: Secondary | ICD-10-CM | POA: Diagnosis not present

## 2018-08-11 DIAGNOSIS — G43019 Migraine without aura, intractable, without status migrainosus: Secondary | ICD-10-CM | POA: Diagnosis not present

## 2018-08-17 DIAGNOSIS — Z981 Arthrodesis status: Secondary | ICD-10-CM | POA: Diagnosis not present

## 2018-08-17 DIAGNOSIS — M542 Cervicalgia: Secondary | ICD-10-CM | POA: Diagnosis not present

## 2018-08-17 DIAGNOSIS — M5412 Radiculopathy, cervical region: Secondary | ICD-10-CM | POA: Diagnosis not present

## 2018-08-17 DIAGNOSIS — M25511 Pain in right shoulder: Secondary | ICD-10-CM | POA: Diagnosis not present

## 2018-08-22 DIAGNOSIS — Z6824 Body mass index (BMI) 24.0-24.9, adult: Secondary | ICD-10-CM | POA: Diagnosis not present

## 2018-08-22 DIAGNOSIS — H6690 Otitis media, unspecified, unspecified ear: Secondary | ICD-10-CM | POA: Diagnosis not present

## 2018-09-05 ENCOUNTER — Encounter: Payer: Self-pay | Admitting: Podiatry

## 2018-09-05 ENCOUNTER — Ambulatory Visit (INDEPENDENT_AMBULATORY_CARE_PROVIDER_SITE_OTHER): Payer: Medicare Other | Admitting: Podiatry

## 2018-09-05 VITALS — BP 172/88 | Temp 97.9°F

## 2018-09-05 DIAGNOSIS — L600 Ingrowing nail: Secondary | ICD-10-CM

## 2018-09-05 NOTE — Patient Instructions (Signed)

## 2018-09-06 ENCOUNTER — Telehealth: Payer: Self-pay

## 2018-09-06 NOTE — Telephone Encounter (Signed)
Spoke with patient this morning to see how she was feeling after her procedure that was done yesterday.  She had partial nail removal done on 09/05/2018.  The patient did experience an episode where she passed out momentarily.  When she woke she felt slightly weak.  All vitals were taken at this time and they were all stable blood pressure was little high at 172/88.  Patient did just back from a weeklong vacation in [place], she stated that she did not feel like she was hydrated enough, and was very tired from jet lag.  Patient was released to the care of her daughter, daughter drove her home.  Today she states that she is feeling much better, and is not having any complications with her toe at this time.  I did advise her to call with any questions or concerns and to keep her follow-up appointment.

## 2018-09-11 DIAGNOSIS — L600 Ingrowing nail: Secondary | ICD-10-CM | POA: Insufficient documentation

## 2018-09-11 NOTE — Progress Notes (Signed)
Subjective: 72 year old female presents the office today for concerns of ingrown toenail, pain to the right big toe which is been ongoing for the last 4 days.  She states that she recently was just on vacation she was on a walking this caused the symptoms to worsen.  Denies any redness or drainage. Denies any systemic complaints such as fevers, chills, nausea, vomiting. No acute changes since last appointment, and no other complaints at this time.   Objective: AAO x3, NAD DP/PT pulses palpable bilaterally, CRT less than 3 seconds Incurvation present to the medial aspect of the right hallux toenail with tenderness palpation.  Localized edema there is faint erythema more from inflammation as opposed to infection.  There is no ascending cellulitis.  There is no drainage or pus.  Tenderness palpation to the nail corner. No open lesions or pre-ulcerative lesions.  No pain with calf compression, swelling, warmth, erythema  Assessment: Right hallux symptomatic ingrown toenail  Plan: -All treatment options discussed with the patient including all alternatives, risks, complications.  -At this time, the patient is requesting partial nail removal with chemical matricectomy to the symptomatic portion of the nail. Risks and complications were discussed with the patient for which they understand and written consent was obtained. Under sterile conditions a total of 3 mL of a mixture of 2% lidocaine plain and 0.5% Marcaine plain was infiltrated in a hallux block fashion. Once anesthetized, the skin was prepped in sterile fashion. A tourniquet was then applied. Next the medial aspect of hallux nail border was then sharply excised making sure to remove the entire offending nail border. Once the nails were ensured to be removed area was debrided and the underlying skin was intact. There is no purulence identified in the procedure. Next phenol was then applied under standard conditions and copiously irrigated. Silvadene  was applied. A dry sterile dressing was applied. After application of the dressing the tourniquet was removed and there is found to be an immediate capillary refill time to the digit.  Post procedure instructions were discussed the patient for which he verbally understood. Follow-up in one week for nail check or sooner if any problems are to arise. Discussed signs/symptoms of infection and directed to call the office immediately should any occur or go directly to the emergency room. In the meantime, encouraged to call the office with any questions, concerns, changes symptoms. -Patient encouraged to call the office with any questions, concerns, change in symptoms.   *During the procedure the patient did pass out. Her vital signs remained stable.  She came out of it very quickly and she was AAO x 3.  She recently just came back from vacation yesterday and she was in a lot of traveling she has not been drinking enough water or eating.  Her daughter came to pick her up.  We did call her later on to check on her she was doing well.  Trula Slade DPM

## 2018-09-18 ENCOUNTER — Ambulatory Visit (INDEPENDENT_AMBULATORY_CARE_PROVIDER_SITE_OTHER): Payer: Self-pay | Admitting: Podiatry

## 2018-09-18 DIAGNOSIS — Z9889 Other specified postprocedural states: Secondary | ICD-10-CM

## 2018-09-18 DIAGNOSIS — L600 Ingrowing nail: Secondary | ICD-10-CM

## 2018-09-18 NOTE — Patient Instructions (Signed)

## 2018-09-19 DIAGNOSIS — N3020 Other chronic cystitis without hematuria: Secondary | ICD-10-CM | POA: Diagnosis not present

## 2018-09-19 DIAGNOSIS — R350 Frequency of micturition: Secondary | ICD-10-CM | POA: Diagnosis not present

## 2018-09-19 DIAGNOSIS — R109 Unspecified abdominal pain: Secondary | ICD-10-CM | POA: Diagnosis not present

## 2018-09-19 DIAGNOSIS — R311 Benign essential microscopic hematuria: Secondary | ICD-10-CM | POA: Diagnosis not present

## 2018-09-20 DIAGNOSIS — M961 Postlaminectomy syndrome, not elsewhere classified: Secondary | ICD-10-CM | POA: Diagnosis not present

## 2018-09-20 DIAGNOSIS — M5416 Radiculopathy, lumbar region: Secondary | ICD-10-CM | POA: Diagnosis not present

## 2018-09-20 DIAGNOSIS — M47812 Spondylosis without myelopathy or radiculopathy, cervical region: Secondary | ICD-10-CM | POA: Diagnosis not present

## 2018-09-20 DIAGNOSIS — G894 Chronic pain syndrome: Secondary | ICD-10-CM | POA: Diagnosis not present

## 2018-09-21 DIAGNOSIS — N3010 Interstitial cystitis (chronic) without hematuria: Secondary | ICD-10-CM | POA: Diagnosis not present

## 2018-09-24 NOTE — Progress Notes (Signed)
Subjective: April Berg is a 72 y.o.  female returns to office today for follow up evaluation after having right Hallux medial partial nail avulsion performed. Patient has been soaking using epsom salts and applying topical antibiotic covered with bandaid daily.  She states is doing well she has no pain, redness or drainage or any swelling or pus.  Patient denies fevers, chills, nausea, vomiting. Denies any calf pain, chest pain, SOB.   Objective:  Vitals: Reviewed  General: Well developed, nourished, in no acute distress, alert and oriented x3   Dermatology: Skin is warm, dry and supple bilateral. Hallux nail border appears to be clean, dry, with mild granular tissue and surrounding scab. There is no surrounding erythema, edema, drainage/purulence. The remaining nails appear unremarkable at this time. There are no other lesions or other signs of infection present.  Neurovascular status: Intact. No lower extremity swelling; No pain with calf compression bilateral.  Musculoskeletal: Decreased tenderness to palpation of the hallux medial nail fold. Muscular strength within normal limits bilateral.   Assesement and Plan: S/p partial nail avulsion, doing well.   -Continue soaking in epsom salts twice a day followed by antibiotic ointment and a band-aid. Can leave uncovered at night. Continue this until completely healed.  -If the area has not healed in 2 weeks, call the office for follow-up appointment, or sooner if any problems arise.  -Monitor for any signs/symptoms of infection. Call the office immediately if any occur or go directly to the emergency room. Call with any questions/concerns.  Celesta Gentile, DPM

## 2018-09-25 DIAGNOSIS — N281 Cyst of kidney, acquired: Secondary | ICD-10-CM | POA: Diagnosis not present

## 2018-09-25 DIAGNOSIS — R3121 Asymptomatic microscopic hematuria: Secondary | ICD-10-CM | POA: Diagnosis not present

## 2018-09-26 DIAGNOSIS — N3010 Interstitial cystitis (chronic) without hematuria: Secondary | ICD-10-CM | POA: Diagnosis not present

## 2018-10-17 ENCOUNTER — Ambulatory Visit (HOSPITAL_COMMUNITY)
Admission: EM | Admit: 2018-10-17 | Discharge: 2018-10-17 | Disposition: A | Discharge status: Home / Self Care | Payer: Medicare Other | Attending: Family Medicine | Admitting: Family Medicine

## 2018-10-17 ENCOUNTER — Ambulatory Visit (INDEPENDENT_AMBULATORY_CARE_PROVIDER_SITE_OTHER): Payer: Medicare Other

## 2018-10-17 ENCOUNTER — Encounter (HOSPITAL_COMMUNITY): Payer: Self-pay | Admitting: Emergency Medicine

## 2018-10-17 DIAGNOSIS — R05 Cough: Secondary | ICD-10-CM | POA: Diagnosis not present

## 2018-10-17 DIAGNOSIS — J181 Lobar pneumonia, unspecified organism: Secondary | ICD-10-CM | POA: Diagnosis not present

## 2018-10-17 DIAGNOSIS — J189 Pneumonia, unspecified organism: Secondary | ICD-10-CM

## 2018-10-17 MED ORDER — AZITHROMYCIN 250 MG PO TABS: 250.0000 mg | ORAL_TABLET | Freq: Every day | ORAL | 0 refills | Status: DC

## 2018-10-17 NOTE — Discharge Instructions (Signed)
Rest Push fluids Call for any problems

## 2018-10-17 NOTE — ED Provider Notes (Signed)
[facility]    CSN: [id number] Arrival date & time: 10/17/18  1155     History   Chief Complaint Chief Complaint  Patient presents with  . Fever    HPI April Berg is a 72 y.o. female.   HPI  She has symptoms of upper respiratory infection.  Mild sinus pressure on the right cheek.  Postnasal drip.  Headache.  She is coughing and chest congestion.  She feels body aches and fatigue.  This morning she had a temperature to 103. She does have a history of chronic sinus infection.  She was prescribed Augmentin last month, but only took 4 days and felt better. She has not had underlying asthma or COPD, or history of pneumonia.  She is up-to-date with all of her immunizations. She is here with her husband who is a retired Sales promotion account executive.  Past Medical History:  Diagnosis Date  . Anxiety   . Arthritis    osteoarthritis  . Cataract   . Complication of anesthesia 90's   block for elbow surgery and pt had a seizure... surgery since with no problem   . Depression   . GERD (gastroesophageal reflux disease)   . Glaucoma of both eyes   . H/O hiatal hernia   . History of pericarditis    DEC 2008 AND SMALL PERICARDIAL EFFUSION--   RESOLVED  . Hyperlipidemia   . Hypertension   . Hypothyroidism   . IBS (irritable bowel syndrome)   . Interstitial cystitis   . Migraines   . Pelvic pain   . Seizures ([place])    one occurence in the 1990's after anesthesia, no problems since  . Spinal stenosis   . Vitamin D deficiency     Patient Active Problem List   Diagnosis Date Noted  . Ingrown toenail 09/11/2018  . Pain of left hand 05/10/2018  . Bilateral shoulder pain 03/22/2018  . Fusion of spine of lumbosacral region 03/25/2016  . 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 Epiretinal membrane (ERM) of left eye 12/16/2015  . Intermediate stage nonexudative age-related macular degeneration of both eyes 12/16/2015  . Pseudophakia of both eyes 12/16/2015  . Vertigo  01/30/2015  . Neck pain 01/30/2015  . Other cervical disc degeneration, unspecified cervical region 01/20/2015  . Laryngopharyngeal reflux (LPR) 08/15/2013  . Cervical radiculopathy 10/03/2012  . Balance problems 07/10/2012  . 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 Tinnitus 07/10/2012  . Allergic rhinitis 05/02/2012  . Chronic sinusitis 05/02/2012  . Facet arthritis of cervical region 12/08/2011  . ABDOMINAL PAIN-RUQ 03/26/2010  . EPIGASTRIC PAIN 03/23/2010  . NONSPECIFIC ABNORMAL FIND RAD&OTH EXAM GI TRACT 07/10/2008  . HYPOTHYROIDISM 07/09/2008  . Major depression 07/09/2008  . GLAUCOMA 07/09/2008  . GERD 07/09/2008  . IRRITABLE BOWEL SYNDROME 07/09/2008  . ARTHRITIS 07/09/2008  . CHEST PAIN 07/09/2008  . Glaucoma 07/09/2008    Past Surgical History:  Procedure Laterality Date  . ANTERIOR CERVICAL DECOMP/DISCECTOMY FUSION  12/08/2011   Procedure: ANTERIOR CERVICAL DECOMPRESSION/DISCECTOMY FUSION 3 LEVELS;  Surgeon: Sinclair Ship, MD;  Location: [facility];  Service: Orthopedics;  Laterality: N/A;  C 3-6 ACDF  . CARDIAC CATHETERIZATION  10-27-2007  DR Eustace Quail   NORMAL CORONARY ARTERIES/ NORMAL LVF  . CARDIOVASCULAR STRESS TEST  02-25-2010  DR CRENSHAW   ANTERIOR ATTENUATION NO SCAR OR ISCHEMIA/ EF 81%  . CATARACT EXTRACTION W/ INTRAOCULAR LENS  IMPLANT,  BILATERAL    . CESAREAN SECTION  1975  . COLONOSCOPY    . CYSTO WITH HYDRODISTENSION N/A 06/14/2013   Procedure: CYSTOSCOPY/HYDRODISTENSION/INSTILLATION OF MARCAINE AND PYRIDIUM;  Surgeon: Reece Packer, MD;  Location: [facility];  Service: Urology;  Laterality: N/A;  . ELBOW TENDON RELEASE Left 1996  . GLAUCOMA SURGERY Bilateral 2004  . RHINOPLASTY  2010  . THUMB TENDON TRANSPOSITION Right 2012  . TONSILLECTOMY  1951  . TOTAL ABDOMINAL HYSTERECTOMY W/ BILATERAL SALPINGOOPHORECTOMY  1982    . TRANSTHORACIC ECHOCARDIOGRAM  03-04-2010   MODERATE LVH/ NORMAL LVSF/ MILD DIASTOLIC DYSFUNCTION/ EF 94%/ MILD AI  . UPPER GASTROINTESTINAL ENDOSCOPY      OB History   None      Home Medications    Prior to Admission medications   Medication Sig Start Date End Date Taking? Authorizing Provider  azithromycin (ZITHROMAX) 250 MG tablet Take 1 tablet (250 mg total) by mouth daily. Take first 2 tablets together, then 1 every day until finished. 10/17/18   Raylene Everts, MD  baclofen (LIORESAL) 10 MG tablet Take 10 mg by mouth 4 (four) times daily as needed for muscle spasms.    [provider]  Calcium Carbonate (CALCIUM 600 PO) Take 1,200 mg by mouth daily.    [provider]  cholecalciferol (VITAMIN D) 1000 units tablet Take 2,000 Units by mouth daily.    [provider]  dexlansoprazole (DEXILANT) 60 MG capsule Take 60 mg by mouth daily.    [provider]  escitalopram (LEXAPRO) 10 MG tablet Take 10 mg by mouth daily.  08/18/15   [provider]  famciclovir (FAMVIR) 500 MG tablet Take by mouth 3 (three) times daily.    [provider]  fexofenadine (ALLEGRA) 180 MG tablet Take 180 mg by mouth daily.    [provider]  fluticasone (FLONASE) 50 MCG/ACT nasal spray Place 1 spray into both nostrils daily as needed for allergies.     [provider]  guaiFENesin (MUCINEX) 600 MG 12 hr tablet Take 600 mg by mouth 2 (two) times daily as needed for cough.    [provider]  levothyroxine (SYNTHROID, LEVOTHROID) 125 MCG tablet Take 125 mcg by mouth daily before breakfast.    [provider]  meclizine (ANTIVERT) 50 MG tablet Take 1 tablet (50 mg total) by mouth 3 (three) times daily as needed. 08/26/15   Fransico Meadow, PA-C  Multiple Vitamins-Minerals (MULTIVITAMINS THER. W/MINERALS) TABS Take 1 tablet by mouth daily.    [provider]  oxyCODONE (OXY IR/ROXICODONE) 5 MG immediate  release tablet Take 10-325 mg by mouth every 4 (four) hours as needed for moderate pain or severe pain.     [provider]  polyethylene glycol [facility]) packet Use three times daily until bowels move - Max 3 consecutive days 03/31/16   Charlann Lange, PA-C  SUMAtriptan (IMITREX) 100 MG tablet Take 1 tablet (100 mg total) by mouth every 2 (two) hours as needed for migraine. May repeat in 2 hours if headache persists or recurs. 08/26/15   Fransico Meadow, PA-C  telmisartan (MICARDIS) 80 MG tablet Take 40 mg by mouth daily.  08/20/15   [provider]  timolol (TIMOPTIC) 0.5 % ophthalmic solution Place 1 drop into both eyes daily.     [provider]  topiramate (TOPAMAX) 100 MG tablet Take 100 mg by mouth every evening.    [provider]  Vitamin D, Ergocalciferol, (DRISDOL) 50000 UNITS CAPS  Take 50,000 Units by mouth every 7 (seven) days. On Wednesdays.    [provider]    Family History Family History  Problem Relation Age of Onset  . Pulmonary fibrosis Mother   . Lung disease Father   . Colon cancer Neg Hx   . Esophageal cancer Neg Hx   . Rectal cancer Neg Hx   . Stomach cancer Neg Hx     Social History Social History   Tobacco Use  . Smoking status: Never Smoker  . Smokeless tobacco: Never Used  Substance Use Topics  . Alcohol use: Yes    Alcohol/week: 2.0 standard drinks    Types: 2 Glasses of wine per week  . Drug use: No     Allergies   Moxifloxacin hcl in nacl; Nsaids; Sulfa antibiotics; Adhesive [tape]; Aspirin; Avelox [moxifloxacin]; Chlorthalidone; Hydrochlorothiazide; and Neomycin   Review of Systems Review of Systems  Constitutional: Positive for activity change, chills, fatigue and fever.  HENT: Positive for congestion, postnasal drip, rhinorrhea and sinus pressure. Negative for dental problem, ear pain and sore throat.   Eyes: Negative for pain and visual disturbance.  Respiratory: Positive for cough and chest  tightness. Negative for shortness of breath.   Cardiovascular: Negative for chest pain and palpitations.  Gastrointestinal: Negative for abdominal distention, abdominal pain, nausea and vomiting.  Genitourinary: Negative for dysuria and hematuria.  Musculoskeletal: Negative for arthralgias and back pain.  Skin: Negative for color change and rash.  Neurological: Negative for seizures and syncope.  Psychiatric/Behavioral: Negative for sleep disturbance. The patient is not nervous/anxious.   All other systems reviewed and are negative.    Physical Exam Triage Vital Signs ED Triage Vitals  Enc Vitals Group     BP 10/17/18 1324 129/61     Pulse Rate 10/17/18 1324 (!) 120     Resp 10/17/18 1324 18     Temp 10/17/18 1324 99.8 F (37.7 C)     Temp Source 10/17/18 1324 Oral     SpO2 10/17/18 1324 96 %     Weight --      Height --      Head Circumference --      Peak Flow --      Pain Score 10/17/18 1325 5     Pain Loc --      Pain Edu? --      Excl. in [place]? --    No data found.  Updated Vital Signs BP 129/61 (BP Location: Right Arm)   Pulse (!) 120   Temp 99.8 F (37.7 C) (Oral)   Resp 18   SpO2 96%       Physical Exam  Constitutional: She appears well-developed and well-nourished. She appears distressed.  HENT:  Head: Normocephalic and atraumatic.  Right Ear: External ear normal.  Left Ear: External ear normal.  Mouth/Throat: Oropharynx is clear and moist.  Posterior pharynx clear.  Clear rhinorrhea.  No sinus tenderness.  Eyes: Pupils are equal, round, and reactive to light. Conjunctivae are normal.  Neck: Normal range of motion. Neck supple.  Cardiovascular: Normal rate, regular rhythm and normal heart sounds.  Pulmonary/Chest: Effort normal. No respiratory distress. She has wheezes.  Patient has faint wheezes bilaterally.  Left greater than right.  No rales or rhonchi  Abdominal: Soft. Bowel sounds are normal. She exhibits no distension.  No organomegaly   Musculoskeletal: Normal range of motion. She exhibits no edema.  Lymphadenopathy:    She has no cervical adenopathy.  Neurological: She is alert.  Skin: Skin is warm. She is diaphoretic.  Psychiatric: She has a normal mood and affect. Her behavior is normal.     UC Treatments / Results  Labs (all labs ordered are listed, but only abnormal results are displayed) Labs Reviewed - No data to display  EKG None  Radiology Dg Chest 2 View  Result Date: 10/17/2018 CLINICAL DATA:  fever and cough EXAM: CHEST - 2 VIEW COMPARISON:  PA chest x-ray of Mar 30, 2016 FINDINGS: The lungs are well-expanded. There is no focal infiltrate. There is subtle nodular density lateral to the left cardiac apex on the frontal view only. The heart and pulmonary vascularity are normal. The mediastinum is normal in width. The bony thorax exhibits no acute abnormality. IMPRESSION: There is no acute pneumonia nor CHF. Mild chronic bronchitic changes are likely present. There is subtle nodularity on the left lateral to the cardiac apex that is not clearly a nipple shadow. A repeat chest x-ray with nipple markers would be useful. If the nodule does not correspond to a nipple shadow, noncontrast chest CT scanning would be recommended. Electronically Signed   By: David  Martinique M.D.   On: 10/17/2018 14:19    Procedures Procedures (including critical care time)  Medications Ordered in UC Medications - No data to display  Initial Impression / Assessment and Plan / UC Course  I have reviewed the triage vital signs and the nursing notes.  Pertinent labs & imaging results that were available during my care of the patient were reviewed by me and considered in my medical decision making (see chart for details).     I pulled up the x-rays and showed them to Dr. and Mrs. Naeve.  I discussed treatment of community-acquired pneumonia.  Reviewed that either azithromycin or Levaquin would be appropriate.  Dr. Teena Dunk states that his  wife cannot take Levaquin.  She has medicine for pain.  She has medicine for cough.  She is prescribed azithromycin.  Needs close follow-up with her PCP. Final Clinical Impressions(s) / UC Diagnoses   Final diagnoses:  Community acquired pneumonia of left lower lobe of lung [facility])     Discharge Instructions     Rest Push fluids Call for any problems   ED Prescriptions    Medication Sig Dispense Auth. Provider   azithromycin (ZITHROMAX) 250 MG tablet Take 1 tablet (250 mg total) by mouth daily. Take first 2 tablets together, then 1 every day until finished. 6 tablet Raylene Everts, MD     Controlled Substance Prescriptions [place] Controlled Substance Registry consulted? Not Applicable   Raylene Everts, MD 10/17/18 2048

## 2018-10-17 NOTE — ED Triage Notes (Addendum)
Pt here for fever and URI sx with body aches; temp was 103 this am

## 2018-10-24 DIAGNOSIS — J209 Acute bronchitis, unspecified: Secondary | ICD-10-CM | POA: Diagnosis not present

## 2018-10-24 DIAGNOSIS — Z6825 Body mass index (BMI) 25.0-25.9, adult: Secondary | ICD-10-CM | POA: Diagnosis not present

## 2018-11-08 ENCOUNTER — Other Ambulatory Visit: Payer: Self-pay | Admitting: Internal Medicine

## 2018-11-08 DIAGNOSIS — Z1231 Encounter for screening mammogram for malignant neoplasm of breast: Secondary | ICD-10-CM

## 2018-11-28 DIAGNOSIS — H903 Sensorineural hearing loss, bilateral: Secondary | ICD-10-CM | POA: Diagnosis not present

## 2018-11-28 DIAGNOSIS — M7062 Trochanteric bursitis, left hip: Secondary | ICD-10-CM | POA: Diagnosis not present

## 2018-11-28 DIAGNOSIS — M25552 Pain in left hip: Secondary | ICD-10-CM | POA: Diagnosis not present

## 2018-11-28 DIAGNOSIS — Z9622 Myringotomy tube(s) status: Secondary | ICD-10-CM | POA: Diagnosis not present

## 2018-12-19 ENCOUNTER — Ambulatory Visit
Admission: RE | Admit: 2018-12-19 | Discharge: 2018-12-19 | Disposition: A | Discharge status: Home / Self Care | Payer: Medicare Other | Source: Ambulatory Visit | Attending: Internal Medicine | Admitting: Internal Medicine

## 2018-12-19 DIAGNOSIS — Z1231 Encounter for screening mammogram for malignant neoplasm of breast: Secondary | ICD-10-CM

## 2018-12-28 DIAGNOSIS — H6691 Otitis media, unspecified, right ear: Secondary | ICD-10-CM | POA: Diagnosis not present

## 2018-12-28 DIAGNOSIS — Z6824 Body mass index (BMI) 24.0-24.9, adult: Secondary | ICD-10-CM | POA: Diagnosis not present

## 2019-01-01 DIAGNOSIS — I10 Essential (primary) hypertension: Secondary | ICD-10-CM | POA: Diagnosis not present

## 2019-01-01 DIAGNOSIS — E7849 Other hyperlipidemia: Secondary | ICD-10-CM | POA: Diagnosis not present

## 2019-01-01 DIAGNOSIS — E038 Other specified hypothyroidism: Secondary | ICD-10-CM | POA: Diagnosis not present

## 2019-01-01 DIAGNOSIS — R82998 Other abnormal findings in urine: Secondary | ICD-10-CM | POA: Diagnosis not present

## 2019-01-01 DIAGNOSIS — M859 Disorder of bone density and structure, unspecified: Secondary | ICD-10-CM | POA: Diagnosis not present

## 2019-01-02 DIAGNOSIS — H6991 Unspecified Eustachian tube disorder, right ear: Secondary | ICD-10-CM | POA: Diagnosis not present

## 2019-01-04 DIAGNOSIS — M810 Age-related osteoporosis without current pathological fracture: Secondary | ICD-10-CM | POA: Diagnosis not present

## 2019-01-04 DIAGNOSIS — F3342 Major depressive disorder, recurrent, in full remission: Secondary | ICD-10-CM | POA: Diagnosis not present

## 2019-01-04 DIAGNOSIS — K219 Gastro-esophageal reflux disease without esophagitis: Secondary | ICD-10-CM | POA: Diagnosis not present

## 2019-01-04 DIAGNOSIS — G43909 Migraine, unspecified, not intractable, without status migrainosus: Secondary | ICD-10-CM | POA: Diagnosis not present

## 2019-01-04 DIAGNOSIS — I10 Essential (primary) hypertension: Secondary | ICD-10-CM | POA: Diagnosis not present

## 2019-01-04 DIAGNOSIS — Z6824 Body mass index (BMI) 24.0-24.9, adult: Secondary | ICD-10-CM | POA: Diagnosis not present

## 2019-01-04 DIAGNOSIS — E7849 Other hyperlipidemia: Secondary | ICD-10-CM | POA: Diagnosis not present

## 2019-01-04 DIAGNOSIS — E038 Other specified hypothyroidism: Secondary | ICD-10-CM | POA: Diagnosis not present

## 2019-01-04 DIAGNOSIS — N3010 Interstitial cystitis (chronic) without hematuria: Secondary | ICD-10-CM | POA: Diagnosis not present

## 2019-01-04 DIAGNOSIS — Z0000 Encounter for general adult medical examination without abnormal findings: Secondary | ICD-10-CM | POA: Diagnosis not present

## 2019-01-04 DIAGNOSIS — M542 Cervicalgia: Secondary | ICD-10-CM | POA: Diagnosis not present

## 2019-01-04 DIAGNOSIS — H6691 Otitis media, unspecified, right ear: Secondary | ICD-10-CM | POA: Diagnosis not present

## 2019-01-05 DIAGNOSIS — Z1331 Encounter for screening for depression: Secondary | ICD-10-CM | POA: Diagnosis not present

## 2019-01-05 DIAGNOSIS — Z1339 Encounter for screening examination for other mental health and behavioral disorders: Secondary | ICD-10-CM | POA: Diagnosis not present

## 2019-01-08 DIAGNOSIS — N3010 Interstitial cystitis (chronic) without hematuria: Secondary | ICD-10-CM | POA: Diagnosis not present

## 2019-01-08 DIAGNOSIS — R102 Pelvic and perineal pain: Secondary | ICD-10-CM | POA: Diagnosis not present

## 2019-01-08 DIAGNOSIS — G894 Chronic pain syndrome: Secondary | ICD-10-CM | POA: Diagnosis not present

## 2019-01-08 DIAGNOSIS — M961 Postlaminectomy syndrome, not elsewhere classified: Secondary | ICD-10-CM | POA: Diagnosis not present

## 2019-01-12 DIAGNOSIS — Z1212 Encounter for screening for malignant neoplasm of rectum: Secondary | ICD-10-CM | POA: Diagnosis not present

## 2019-01-25 DIAGNOSIS — M810 Age-related osteoporosis without current pathological fracture: Secondary | ICD-10-CM | POA: Diagnosis not present

## 2019-01-25 DIAGNOSIS — Z6824 Body mass index (BMI) 24.0-24.9, adult: Secondary | ICD-10-CM | POA: Diagnosis not present

## 2019-01-26 ENCOUNTER — Other Ambulatory Visit (HOSPITAL_COMMUNITY): Payer: Self-pay | Admitting: *Deleted

## 2019-01-29 ENCOUNTER — Other Ambulatory Visit: Payer: Self-pay

## 2019-01-29 ENCOUNTER — Ambulatory Visit (HOSPITAL_COMMUNITY)
Admission: RE | Admit: 2019-01-29 | Discharge: 2019-01-29 | Disposition: A | Discharge status: Home / Self Care | Payer: Medicare Other | Source: Ambulatory Visit | Attending: Internal Medicine | Admitting: Internal Medicine

## 2019-01-29 DIAGNOSIS — M810 Age-related osteoporosis without current pathological fracture: Secondary | ICD-10-CM | POA: Insufficient documentation

## 2019-01-29 MED ORDER — ZOLEDRONIC ACID 5 MG/100ML IV SOLN
5.0000 mg | Freq: Once | INTRAVENOUS | Status: AC
  Administered 2019-01-29: 5 mg via INTRAVENOUS

## 2019-01-29 MED ORDER — ZOLEDRONIC ACID 5 MG/100ML IV SOLN
INTRAVENOUS | Status: AC
  Administered 2019-01-29: 5 mg via INTRAVENOUS
  Filled 2019-01-29: qty 100

## 2019-01-29 NOTE — Discharge Instructions (Signed)

## 2019-02-07 DIAGNOSIS — H35372 Puckering of macula, left eye: Secondary | ICD-10-CM | POA: Diagnosis not present

## 2019-02-07 DIAGNOSIS — Z961 Presence of intraocular lens: Secondary | ICD-10-CM | POA: Diagnosis not present

## 2019-02-07 DIAGNOSIS — H5712 Ocular pain, left eye: Secondary | ICD-10-CM | POA: Diagnosis not present

## 2019-02-07 DIAGNOSIS — H02055 Trichiasis without entropian left lower eyelid: Secondary | ICD-10-CM | POA: Diagnosis not present

## 2019-03-28 DIAGNOSIS — G43719 Chronic migraine without aura, intractable, without status migrainosus: Secondary | ICD-10-CM | POA: Diagnosis not present

## 2019-03-28 DIAGNOSIS — G43809 Other migraine, not intractable, without status migrainosus: Secondary | ICD-10-CM | POA: Diagnosis not present

## 2019-03-28 DIAGNOSIS — G43019 Migraine without aura, intractable, without status migrainosus: Secondary | ICD-10-CM | POA: Diagnosis not present

## 2019-04-02 DIAGNOSIS — Z79891 Long term (current) use of opiate analgesic: Secondary | ICD-10-CM | POA: Diagnosis not present

## 2019-04-02 DIAGNOSIS — G894 Chronic pain syndrome: Secondary | ICD-10-CM | POA: Diagnosis not present

## 2019-04-02 DIAGNOSIS — N3010 Interstitial cystitis (chronic) without hematuria: Secondary | ICD-10-CM | POA: Diagnosis not present

## 2019-04-02 DIAGNOSIS — M961 Postlaminectomy syndrome, not elsewhere classified: Secondary | ICD-10-CM | POA: Diagnosis not present

## 2019-04-02 DIAGNOSIS — R102 Pelvic and perineal pain: Secondary | ICD-10-CM | POA: Diagnosis not present

## 2019-04-04 DIAGNOSIS — H04123 Dry eye syndrome of bilateral lacrimal glands: Secondary | ICD-10-CM | POA: Diagnosis not present

## 2019-04-04 DIAGNOSIS — H10413 Chronic giant papillary conjunctivitis, bilateral: Secondary | ICD-10-CM | POA: Diagnosis not present

## 2019-04-12 DIAGNOSIS — J45909 Unspecified asthma, uncomplicated: Secondary | ICD-10-CM | POA: Diagnosis not present

## 2019-04-12 DIAGNOSIS — R05 Cough: Secondary | ICD-10-CM | POA: Diagnosis not present

## 2019-04-12 DIAGNOSIS — J309 Allergic rhinitis, unspecified: Secondary | ICD-10-CM | POA: Diagnosis not present

## 2019-04-25 DIAGNOSIS — H401131 Primary open-angle glaucoma, bilateral, mild stage: Secondary | ICD-10-CM | POA: Diagnosis not present

## 2019-04-25 DIAGNOSIS — H35372 Puckering of macula, left eye: Secondary | ICD-10-CM | POA: Diagnosis not present

## 2019-04-25 DIAGNOSIS — H353131 Nonexudative age-related macular degeneration, bilateral, early dry stage: Secondary | ICD-10-CM | POA: Diagnosis not present

## 2019-04-25 DIAGNOSIS — D3132 Benign neoplasm of left choroid: Secondary | ICD-10-CM | POA: Diagnosis not present

## 2019-05-10 DIAGNOSIS — K582 Mixed irritable bowel syndrome: Secondary | ICD-10-CM | POA: Diagnosis not present

## 2019-05-10 DIAGNOSIS — N3010 Interstitial cystitis (chronic) without hematuria: Secondary | ICD-10-CM | POA: Diagnosis not present

## 2019-05-30 DIAGNOSIS — M25511 Pain in right shoulder: Secondary | ICD-10-CM | POA: Diagnosis not present

## 2019-05-30 DIAGNOSIS — M25512 Pain in left shoulder: Secondary | ICD-10-CM | POA: Diagnosis not present

## 2019-05-31 DIAGNOSIS — H401131 Primary open-angle glaucoma, bilateral, mild stage: Secondary | ICD-10-CM | POA: Diagnosis not present

## 2019-05-31 DIAGNOSIS — D3132 Benign neoplasm of left choroid: Secondary | ICD-10-CM | POA: Diagnosis not present

## 2019-05-31 DIAGNOSIS — Z961 Presence of intraocular lens: Secondary | ICD-10-CM | POA: Diagnosis not present

## 2019-05-31 DIAGNOSIS — H353132 Nonexudative age-related macular degeneration, bilateral, intermediate dry stage: Secondary | ICD-10-CM | POA: Diagnosis not present

## 2019-05-31 DIAGNOSIS — H35372 Puckering of macula, left eye: Secondary | ICD-10-CM | POA: Diagnosis not present

## 2019-06-06 DIAGNOSIS — M25552 Pain in left hip: Secondary | ICD-10-CM | POA: Diagnosis not present

## 2019-06-06 DIAGNOSIS — M7062 Trochanteric bursitis, left hip: Secondary | ICD-10-CM | POA: Diagnosis not present

## 2019-06-07 DIAGNOSIS — M5412 Radiculopathy, cervical region: Secondary | ICD-10-CM | POA: Diagnosis not present

## 2019-06-07 DIAGNOSIS — Z981 Arthrodesis status: Secondary | ICD-10-CM | POA: Diagnosis not present

## 2019-06-07 DIAGNOSIS — M542 Cervicalgia: Secondary | ICD-10-CM | POA: Diagnosis not present

## 2019-06-07 DIAGNOSIS — M5410 Radiculopathy, site unspecified: Secondary | ICD-10-CM | POA: Diagnosis not present

## 2019-06-28 DIAGNOSIS — N3010 Interstitial cystitis (chronic) without hematuria: Secondary | ICD-10-CM | POA: Diagnosis not present

## 2019-06-28 DIAGNOSIS — R102 Pelvic and perineal pain: Secondary | ICD-10-CM | POA: Diagnosis not present

## 2019-07-09 DIAGNOSIS — N3010 Interstitial cystitis (chronic) without hematuria: Secondary | ICD-10-CM | POA: Diagnosis not present

## 2019-07-09 DIAGNOSIS — R102 Pelvic and perineal pain: Secondary | ICD-10-CM | POA: Diagnosis not present

## 2019-07-09 DIAGNOSIS — M961 Postlaminectomy syndrome, not elsewhere classified: Secondary | ICD-10-CM | POA: Diagnosis not present

## 2019-07-09 DIAGNOSIS — G894 Chronic pain syndrome: Secondary | ICD-10-CM | POA: Diagnosis not present

## 2019-07-17 DIAGNOSIS — R062 Wheezing: Secondary | ICD-10-CM | POA: Diagnosis not present

## 2019-07-17 DIAGNOSIS — H1045 Other chronic allergic conjunctivitis: Secondary | ICD-10-CM | POA: Diagnosis not present

## 2019-07-17 DIAGNOSIS — J3089 Other allergic rhinitis: Secondary | ICD-10-CM | POA: Diagnosis not present

## 2019-07-18 DIAGNOSIS — M79672 Pain in left foot: Secondary | ICD-10-CM | POA: Diagnosis not present

## 2019-07-18 DIAGNOSIS — M2022 Hallux rigidus, left foot: Secondary | ICD-10-CM | POA: Diagnosis not present

## 2019-08-06 DIAGNOSIS — M7062 Trochanteric bursitis, left hip: Secondary | ICD-10-CM | POA: Diagnosis not present

## 2019-08-06 DIAGNOSIS — Z981 Arthrodesis status: Secondary | ICD-10-CM | POA: Diagnosis not present

## 2019-08-06 DIAGNOSIS — S39012A Strain of muscle, fascia and tendon of lower back, initial encounter: Secondary | ICD-10-CM | POA: Diagnosis not present

## 2019-08-06 DIAGNOSIS — M25552 Pain in left hip: Secondary | ICD-10-CM | POA: Diagnosis not present

## 2019-08-23 DIAGNOSIS — Z23 Encounter for immunization: Secondary | ICD-10-CM | POA: Diagnosis not present

## 2019-08-26 ENCOUNTER — Emergency Department (HOSPITAL_COMMUNITY)
Admission: EM | Admit: 2019-08-26 | Discharge: 2019-08-26 | Disposition: A | Discharge status: Home / Self Care | Payer: Medicare Other | Attending: Emergency Medicine | Admitting: Emergency Medicine

## 2019-08-26 ENCOUNTER — Emergency Department (HOSPITAL_COMMUNITY): Payer: Medicare Other

## 2019-08-26 ENCOUNTER — Other Ambulatory Visit: Payer: Self-pay

## 2019-08-26 ENCOUNTER — Encounter (HOSPITAL_COMMUNITY): Payer: Self-pay | Admitting: Emergency Medicine

## 2019-08-26 DIAGNOSIS — E039 Hypothyroidism, unspecified: Secondary | ICD-10-CM | POA: Diagnosis not present

## 2019-08-26 DIAGNOSIS — R1010 Upper abdominal pain, unspecified: Secondary | ICD-10-CM

## 2019-08-26 DIAGNOSIS — I10 Essential (primary) hypertension: Secondary | ICD-10-CM | POA: Insufficient documentation

## 2019-08-26 DIAGNOSIS — R109 Unspecified abdominal pain: Secondary | ICD-10-CM | POA: Diagnosis not present

## 2019-08-26 DIAGNOSIS — R1011 Right upper quadrant pain: Secondary | ICD-10-CM | POA: Insufficient documentation

## 2019-08-26 DIAGNOSIS — Z79899 Other long term (current) drug therapy: Secondary | ICD-10-CM | POA: Diagnosis not present

## 2019-08-26 LAB — COMPREHENSIVE METABOLIC PANEL
ALT: 16 U/L (ref 0–44)
AST: 15 U/L (ref 15–41)
Albumin: 4.1 g/dL (ref 3.5–5.0)
Alkaline Phosphatase: 63 U/L (ref 38–126)
Anion gap: 7 (ref 5–15)
BUN: 21 mg/dL (ref 8–23)
CO2: 27 mmol/L (ref 22–32)
Calcium: 8.8 mg/dL — ABNORMAL LOW (ref 8.9–10.3)
Chloride: 105 mmol/L (ref 98–111)
Creatinine, Ser: 0.88 mg/dL (ref 0.44–1.00)
GFR calc Af Amer: >60 mL/min (ref >60)
GFR calc non Af Amer: >60 mL/min (ref >60)
Glucose, Bld: 103 mg/dL — ABNORMAL HIGH (ref 70–99)
Potassium: 4.6 mmol/L (ref 3.5–5.1)
Sodium: 139 mmol/L (ref 135–145)
Total Bilirubin: 0.5 mg/dL (ref 0.3–1.2)
Total Protein: 7.1 g/dL (ref 6.5–8.1)

## 2019-08-26 LAB — URINALYSIS, ROUTINE W REFLEX MICROSCOPIC
Bilirubin Urine: NEGATIVE
Glucose, UA: NEGATIVE mg/dL
Hgb urine dipstick: NEGATIVE
Ketones, ur: NEGATIVE mg/dL
Nitrite: NEGATIVE
Protein, ur: NEGATIVE mg/dL
Specific Gravity, Urine: 1.016 (ref 1.005–1.030)
pH: 5 (ref 5.0–8.0)

## 2019-08-26 LAB — CBC
HCT: 45 % (ref 36.0–46.0)
Hemoglobin: 14.5 g/dL (ref 12.0–15.0)
MCH: 30.3 pg (ref 26.0–34.0)
MCHC: 32.2 g/dL (ref 30.0–36.0)
MCV: 94.1 fL (ref 80.0–100.0)
Platelets: 231 10*3/uL (ref 150–400)
RBC: 4.78 MIL/uL (ref 3.87–5.11)
RDW: 13.5 % (ref 11.5–15.5)
WBC: 13.4 10*3/uL — ABNORMAL HIGH (ref 4.0–10.5)
nRBC: 0 % (ref 0.0–0.2)

## 2019-08-26 LAB — LIPASE, BLOOD: Lipase: 24 U/L (ref 11–51)

## 2019-08-26 MED ORDER — SODIUM CHLORIDE (PF) 0.9 % IJ SOLN
INTRAMUSCULAR | Status: AC
  Filled 2019-08-26: qty 50

## 2019-08-26 MED ORDER — ONDANSETRON HCL 4 MG/2ML IJ SOLN
4.0000 mg | Freq: Once | INTRAMUSCULAR | Status: AC
  Administered 2019-08-26: 08:00:00 4 mg via INTRAVENOUS
  Filled 2019-08-26: qty 2

## 2019-08-26 MED ORDER — ALUM & MAG HYDROXIDE-SIMETH 200-200-20 MG/5ML PO SUSP
15.0000 mL | Freq: Once | ORAL | Status: AC
  Administered 2019-08-26: 15 mL via ORAL
  Filled 2019-08-26: qty 30

## 2019-08-26 MED ORDER — FAMOTIDINE 20 MG PO TABS
20.0000 mg | ORAL_TABLET | Freq: Once | ORAL | Status: AC
  Administered 2019-08-26: 20 mg via ORAL
  Filled 2019-08-26: qty 1

## 2019-08-26 MED ORDER — IOHEXOL 300 MG/ML  SOLN
100.0000 mL | Freq: Once | INTRAMUSCULAR | Status: AC | PRN
  Administered 2019-08-26: 08:00:00 100 mL via INTRAVENOUS

## 2019-08-26 MED ORDER — SODIUM CHLORIDE 0.9% FLUSH
3.0000 mL | Freq: Once | INTRAVENOUS | Status: AC
  Administered 2019-08-26: 08:00:00 3 mL via INTRAVENOUS

## 2019-08-26 MED ORDER — MORPHINE SULFATE (PF) 4 MG/ML IV SOLN
4.0000 mg | Freq: Once | INTRAVENOUS | Status: AC
  Administered 2019-08-26: 4 mg via INTRAVENOUS
  Filled 2019-08-26: qty 1

## 2019-08-26 NOTE — ED Triage Notes (Signed)
Pt reports having RUQ pain that started yesterday along with nausea but no vomiting.

## 2019-08-26 NOTE — Discharge Instructions (Addendum)
It was our pleasure to provide your ER care today - we hope that you feel better.  Your CT scan was read as showing no acute process, no acute infection - it did show a moderate amount of stool in colon - if constipated, try: ensure adequate fiber in diet, drink plenty of fluids/water, and take colace 2x/day (stool softener) and miralax 1x/day as need (laxative).  You may try pepcid, maalox, or gas-x for symptom relief.  If recurrent symptoms, follow up with primary care doctor in the next 1-2 weeks - discuss possible outpatient HIDA scan, and/or referral to GI doctor.  Return to ER if worse, new symptoms, fevers, persistent vomiting, severe pain, or other concern.  You were given pain medication in the ER - no driving for the next 6 hours.

## 2019-08-26 NOTE — ED Provider Notes (Signed)
[facility] DEPT Provider Note   CSN: [id number] Arrival date & time: 08/26/19  0447     History   Chief Complaint Chief Complaint  Patient presents with   Abdominal Pain    HPI April Berg is a 73 y.o. female.     Patient c/o epigastric and ruq pain since yesterday. Symptoms acute onset, moderate-sev, constant, dull, non radiating. No vomiting or diarrhea. No abd distension or constipation. Denies fever or chills. No dysuria or gu c/o. No flank pain. No hx gallstones or pancreatitis. No hx ulcer disease. Remote hx hysterectomy.   The history is provided by the patient.  Abdominal Pain Associated symptoms: no chest pain, no cough, no diarrhea, no dysuria, no fever, no shortness of breath, no sore throat and no vomiting     Past Medical History:  Diagnosis Date   Anxiety    Arthritis    osteoarthritis   Cataract    Complication of anesthesia 90's   block for elbow surgery and pt had a seizure... surgery since with no problem    Depression    GERD (gastroesophageal reflux disease)    Glaucoma of both eyes    H/O hiatal hernia    History of pericarditis    DEC 2008 AND SMALL PERICARDIAL EFFUSION--   RESOLVED   Hyperlipidemia    Hypertension    Hypothyroidism    IBS (irritable bowel syndrome)    Interstitial cystitis    Migraines    Pelvic pain    Seizures ([place])    one occurence in the 1990's after anesthesia, no problems since   Spinal stenosis    Vitamin D deficiency     Patient Active Problem List   Diagnosis Date Noted   Ingrown toenail 09/11/2018   Pain of left hand 05/10/2018   Bilateral shoulder pain 03/22/2018   Fusion of spine of lumbosacral region 03/25/2016   Choroidal nevus, left 12/16/2015   Epiretinal membrane (ERM) of left eye 12/16/2015   Intermediate stage nonexudative age-related macular degeneration of both eyes 12/16/2015   Pseudophakia of both eyes 12/16/2015   Vertigo  01/30/2015   Neck pain 01/30/2015   Other cervical disc degeneration, unspecified cervical region 01/20/2015   Laryngopharyngeal reflux (LPR) 08/15/2013   Cervical radiculopathy 10/03/2012   Balance problems 07/10/2012   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 07/10/2012   Tinnitus 07/10/2012   Allergic rhinitis 05/02/2012   Chronic sinusitis 05/02/2012   Facet arthritis of cervical region 12/08/2011   ABDOMINAL PAIN-RUQ 03/26/2010   EPIGASTRIC PAIN 03/23/2010   NONSPECIFIC ABNORMAL FIND RAD&OTH EXAM GI TRACT 07/10/2008   HYPOTHYROIDISM 07/09/2008   Major depression 07/09/2008   GLAUCOMA 07/09/2008   GERD 07/09/2008   IRRITABLE BOWEL SYNDROME 07/09/2008   ARTHRITIS 07/09/2008   CHEST PAIN 07/09/2008   Glaucoma 07/09/2008    Past Surgical History:  Procedure Laterality Date   ANTERIOR CERVICAL DECOMP/DISCECTOMY FUSION  12/08/2011   Procedure: ANTERIOR CERVICAL DECOMPRESSION/DISCECTOMY FUSION 3 LEVELS;  Surgeon: Sinclair Ship, MD;  Location: [facility];  Service: Orthopedics;  Laterality: N/A;  C 3-6 ACDF   BREAST BIOPSY Right    CARDIAC CATHETERIZATION  10-27-2007  DR Eustace Quail   NORMAL CORONARY ARTERIES/ NORMAL LVF   CARDIOVASCULAR STRESS TEST  02-25-2010  DR CRENSHAW   ANTERIOR ATTENUATION NO SCAR OR ISCHEMIA/ EF 81%   CATARACT EXTRACTION W/ INTRAOCULAR LENS  IMPLANT, BILATERAL  CESAREAN SECTION  1975   COLONOSCOPY     CYSTO WITH HYDRODISTENSION N/A 06/14/2013   Procedure: CYSTOSCOPY/HYDRODISTENSION/INSTILLATION OF MARCAINE AND PYRIDIUM;  Surgeon: Reece Packer, MD;  Location: [facility];  Service: Urology;  Laterality: N/A;   ELBOW TENDON RELEASE Left 1996   GLAUCOMA SURGERY Bilateral 2004   RHINOPLASTY  2010   THUMB TENDON TRANSPOSITION Right 2012   TONSILLECTOMY  1951   TOTAL ABDOMINAL HYSTERECTOMY W/ BILATERAL  SALPINGOOPHORECTOMY  1982   TRANSTHORACIC ECHOCARDIOGRAM  03-04-2010   MODERATE LVH/ NORMAL LVSF/ MILD DIASTOLIC DYSFUNCTION/ EF [id number] MILD AI   UPPER GASTROINTESTINAL ENDOSCOPY       OB History   No obstetric history on file.      Home Medications    Prior to Admission medications   Medication Sig Start Date End Date Taking? Authorizing Provider  azithromycin (ZITHROMAX) 250 MG tablet Take 1 tablet (250 mg total) by mouth daily. Take first 2 tablets together, then 1 every day until finished. 10/17/18   Raylene Everts, MD  baclofen (LIORESAL) 10 MG tablet Take 10 mg by mouth 4 (four) times daily as needed for muscle spasms.    [provider]  Calcium Carbonate (CALCIUM 600 PO) Take 1,200 mg by mouth daily.    [provider]  cholecalciferol (VITAMIN D) 1000 units tablet Take 2,000 Units by mouth daily.    [provider]  dexlansoprazole (DEXILANT) 60 MG capsule Take 60 mg by mouth daily.    [provider]  escitalopram (LEXAPRO) 10 MG tablet Take 10 mg by mouth daily.  08/18/15   [provider]  famciclovir (FAMVIR) 500 MG tablet Take by mouth 3 (three) times daily.    [provider]  fexofenadine (ALLEGRA) 180 MG tablet Take 180 mg by mouth daily.    [provider]  fluticasone (FLONASE) 50 MCG/ACT nasal spray Place 1 spray into both nostrils daily as needed for allergies.     [provider]  guaiFENesin (MUCINEX) 600 MG 12 hr tablet Take 600 mg by mouth 2 (two) times daily as needed for cough.    [provider]  levothyroxine (SYNTHROID, LEVOTHROID) 125 MCG tablet Take 125 mcg by mouth daily before breakfast.    [provider]  meclizine (ANTIVERT) 50 MG tablet Take 1 tablet (50 mg total) by mouth 3 (three) times daily as needed. 08/26/15   Fransico Meadow, PA-C  Multiple Vitamins-Minerals (MULTIVITAMINS THER. W/MINERALS) TABS Take 1 tablet by mouth daily.    [provider]    oxyCODONE (OXY IR/ROXICODONE) 5 MG immediate release tablet Take 10-325 mg by mouth every 4 (four) hours as needed for moderate pain or severe pain.     [provider]  polyethylene glycol [facility]) packet Use three times daily until bowels move - Max 3 consecutive days 03/31/16   Charlann Lange, PA-C  SUMAtriptan (IMITREX) 100 MG tablet Take 1 tablet (100 mg total) by mouth every 2 (two) hours as needed for migraine. May repeat in 2 hours if headache persists or recurs. 08/26/15   Fransico Meadow, PA-C  telmisartan (MICARDIS) 80 MG tablet Take 40 mg by mouth daily.  08/20/15   [provider]  timolol (TIMOPTIC) 0.5 % ophthalmic solution Place 1 drop into both eyes daily.     [provider]  topiramate (TOPAMAX) 100 MG tablet Take 100 mg by mouth every evening.    [provider]  Vitamin D, Ergocalciferol, (DRISDOL) 50000 UNITS CAPS  Take 50,000 Units by mouth every 7 (seven) days. On Wednesdays.    [provider]    Family History Family History  Problem Relation Age of Onset   Pulmonary fibrosis Mother    Lung disease Father    Colon cancer Neg Hx    Esophageal cancer Neg Hx    Rectal cancer Neg Hx    Stomach cancer Neg Hx     Social History Social History   Tobacco Use   Smoking status: Never Smoker   Smokeless tobacco: Never Used  Substance Use Topics   Alcohol use: Yes    Alcohol/week: 2.0 standard drinks    Types: 2 Glasses of wine per week   Drug use: No     Allergies   Moxifloxacin hcl in nacl, Nsaids, Sulfa antibiotics, Adhesive [tape], Aspirin, Avelox [moxifloxacin], Chlorthalidone, Hydrochlorothiazide, and Neomycin   Review of Systems Review of Systems  Constitutional: Negative for fever.  HENT: Negative for sore throat.   Eyes: Negative for redness.  Respiratory: Negative for cough and shortness of breath.   Cardiovascular: Negative for chest pain.  Gastrointestinal: Positive for abdominal pain.  Negative for diarrhea and vomiting.  Endocrine: Negative for polyuria.  Genitourinary: Negative for dysuria and flank pain.  Musculoskeletal: Negative for neck pain.  Skin: Negative for rash.  Neurological: Negative for headaches.  Hematological: Does not bruise/bleed easily.  Psychiatric/Behavioral: Negative for confusion.     Physical Exam Updated Vital Signs BP 111/69 (BP Location: Left Arm)    Pulse (!) 118    Temp 98.4 F (36.9 C) (Oral)    Resp 18    Ht 1.6 m (5\' 3" )    Wt 69.4 kg    SpO2 99%    BMI 27.10 kg/m   Physical Exam Vitals signs and nursing note reviewed.  Constitutional:      Appearance: Normal appearance. She is well-developed.  HENT:     Head: Atraumatic.     Nose: Nose normal.     Mouth/Throat:     Mouth: Mucous membranes are moist.  Eyes:     General: No scleral icterus.    Conjunctiva/sclera: Conjunctivae normal.  Neck:     Musculoskeletal: Normal range of motion and neck supple. No neck rigidity or muscular tenderness.     Trachea: No tracheal deviation.  Cardiovascular:     Rate and Rhythm: Normal rate and regular rhythm.     Pulses: Normal pulses.     Heart sounds: Normal heart sounds. No murmur. No friction rub. No gallop.   Pulmonary:     Effort: Pulmonary effort is normal. No respiratory distress.     Breath sounds: Normal breath sounds.  Abdominal:     General: Bowel sounds are normal. There is no distension.     Palpations: Abdomen is soft. There is no mass.     Tenderness: There is abdominal tenderness. There is no guarding or rebound.     Hernia: No hernia is present.     Comments: Mid abd and ruq tenderness.   Genitourinary:    Comments: No cva tenderness.  Musculoskeletal:        General: No swelling or tenderness.     Right lower leg: No edema.     Left lower leg: No edema.  Skin:    General: Skin is warm and dry.     Findings: No rash.  Neurological:     Mental Status: She is alert.     Comments: Alert, speech normal.  Psychiatric:        Mood and Affect: Mood normal.      ED Treatments / Results  Labs (all labs ordered are listed, but only abnormal results are displayed) Results for orders placed or performed during the hospital encounter of 08/26/19  Lipase, blood  Result Value Ref Range   Lipase 24 11 - 51 U/L  Comprehensive metabolic panel  Result Value Ref Range   Sodium 139 135 - 145 mmol/L   Potassium 4.6 3.5 - 5.1 mmol/L   Chloride 105 98 - 111 mmol/L   CO2 27 22 - 32 mmol/L   Glucose, Bld 103 (H) 70 - 99 mg/dL   BUN 21 8 - 23 mg/dL   Creatinine, Ser 0.88 0.44 - 1.00 mg/dL   Calcium 8.8 (L) 8.9 - 10.3 mg/dL   Total Protein 7.1 6.5 - 8.1 g/dL   Albumin 4.1 3.5 - 5.0 g/dL   AST 15 15 - 41 U/L   ALT 16 0 - 44 U/L   Alkaline Phosphatase 63 38 - 126 U/L   Total Bilirubin 0.5 0.3 - 1.2 mg/dL   GFR calc non Af Amer >60 >60 mL/min   GFR calc Af Amer >60 >60 mL/min   Anion gap 7 5 - 15  CBC  Result Value Ref Range   WBC 13.4 (H) 4.0 - 10.5 K/uL   RBC 4.78 3.87 - 5.11 MIL/uL   Hemoglobin 14.5 12.0 - 15.0 g/dL   HCT 45.0 36.0 - 46.0 %   MCV 94.1 80.0 - 100.0 fL   MCH 30.3 26.0 - 34.0 pg   MCHC 32.2 30.0 - 36.0 g/dL   RDW 13.5 11.5 - 15.5 %   Platelets 231 150 - 400 K/uL   nRBC 0.0 0.0 - 0.2 %  Urinalysis, Routine w reflex microscopic  Result Value Ref Range   Color, Urine YELLOW YELLOW   APPearance HAZY (A) CLEAR   Specific Gravity, Urine 1.016 1.005 - 1.030   pH 5.0 5.0 - 8.0   Glucose, UA NEGATIVE NEGATIVE mg/dL   Hgb urine dipstick NEGATIVE NEGATIVE   Bilirubin Urine NEGATIVE NEGATIVE   Ketones, ur NEGATIVE NEGATIVE mg/dL   Protein, ur NEGATIVE NEGATIVE mg/dL   Nitrite NEGATIVE NEGATIVE   Leukocytes,Ua SMALL (A) NEGATIVE   RBC / HPF 0-5 0 - 5 RBC/hpf   WBC, UA 0-5 0 - 5 WBC/hpf   Bacteria, UA RARE (A) NONE SEEN   Squamous Epithelial / LPF 6-10 0 - 5   Mucus PRESENT    Non Squamous Epithelial 0-5 (A) NONE SEEN    EKG EKG Interpretation  Date/Time:  Sunday August 26 2019 05:27:26 EDT Ventricular Rate:  96 PR Interval:    QRS Duration: 89 QT Interval:  341 QTC Calculation: 431 R Axis:   35 Text Interpretation:  Sinus rhythm Low voltage, precordial leads Baseline wander No significant change since last tracing Confirmed by Lajean Saver [phone]) on 08/26/2019 6:54:06 AM   Radiology Ct Abdomen Pelvis W Contrast  Result Date: 08/26/2019 CLINICAL DATA:  73 year old female with a history of abdominal pain EXAM: CT ABDOMEN AND PELVIS WITH CONTRAST TECHNIQUE: Multidetector CT imaging of the abdomen and pelvis was performed using the standard protocol following bolus administration of intravenous contrast. CONTRAST:  144mL OMNIPAQUE IOHEXOL 300 MG/ML  SOLN COMPARISON:  03/31/2016 FINDINGS: Lower chest: No acute finding of the lower chest. Hepatobiliary: Unremarkable liver.  Unremarkable gallbladder. Pancreas: Unremarkable Spleen: Unremarkable Adrenals/Urinary Tract: Unremarkable appearance of the adrenal glands. No evidence  of hydronephrosis of the right or left kidney. No nephrolithiasis. Unremarkable course of the bilateral ureters. Unremarkable appearance of the urinary bladder. Septated cyst of the left kidney is unchanged dating to the CT of Mar 30, 2010. Compatible with a Bosniak 2 cyst. Stomach/Bowel: Unremarkable stomach. Unremarkable small bowel. Appendix is not visualized, however, no inflammatory changes are present adjacent to the cecum to indicate an appendicitis. Moderate formed stool burden. No focal inflammatory changes. Minimal diverticular disease. Vascular/Lymphatic: Mild atherosclerotic changes of the abdominal aorta. Bilateral iliac arteries and proximal femoral arteries patent. Mesenteric arteries and bilateral renal arteries are patent Reproductive: Hysterectomy Other: Surgical changes of the midline abdomen. Nonspecific pelvic floor laxity Musculoskeletal: No acute displaced fracture. Surgical changes of posterior lumbar interbody fusion of  L4-L5/L5-S1. IMPRESSION: No acute CT finding to account for abdominal pain. Moderate stool burden may indicate slow transit and/or constipation. Aortic Atherosclerosis (ICD10-I70.0). Additional ancillary findings as above. Electronically Signed   By: Corrie Mckusick D.O.   On: 08/26/2019 08:49    Procedures Procedures (including critical care time)  Medications Ordered in ED Medications  sodium chloride flush (NS) 0.9 % injection 3 mL (has no administration in time range)  morphine 4 MG/ML injection 4 mg (has no administration in time range)  ondansetron (ZOFRAN) injection 4 mg (has no administration in time range)     Initial Impression / Assessment and Plan / ED Course  I have reviewed the triage vital signs and the nursing notes.  Pertinent labs & imaging results that were available during my care of the patient were reviewed by me and considered in my medical decision making (see chart for details).  Iv ns. Labs sent. zofran iv. Morphine iv.   CT.  Reviewed nursing notes and prior charts for additional history.   pepcid po, maalox po.  Labs reviewed by me - wbc mildly elev. lfts normal.  CT reviewed by me - neg acute. No gallstones. Prior u/s imaging also reviewed - neg for gallstones then.   Pepcid, maalox po for symptom relieg.   Discussed w pt, if recurrent symptoms, discussed outpatient hida scan with pcp.   Vitals are normal.  Pt currently appears stable for d/c.   Return precautions provided.     Final Clinical Impressions(s) / ED Diagnoses   Final diagnoses:  None    ED Discharge Orders    None       Lajean Saver, MD 08/26/19 [phone]

## 2019-09-26 DIAGNOSIS — H401112 Primary open-angle glaucoma, right eye, moderate stage: Secondary | ICD-10-CM | POA: Diagnosis not present

## 2019-09-26 DIAGNOSIS — H35352 Cystoid macular degeneration, left eye: Secondary | ICD-10-CM | POA: Diagnosis not present

## 2019-09-26 DIAGNOSIS — H35372 Puckering of macula, left eye: Secondary | ICD-10-CM | POA: Diagnosis not present

## 2019-09-27 DIAGNOSIS — D3132 Benign neoplasm of left choroid: Secondary | ICD-10-CM | POA: Diagnosis not present

## 2019-09-27 DIAGNOSIS — Z961 Presence of intraocular lens: Secondary | ICD-10-CM | POA: Diagnosis not present

## 2019-09-27 DIAGNOSIS — H35372 Puckering of macula, left eye: Secondary | ICD-10-CM | POA: Diagnosis not present

## 2019-09-27 DIAGNOSIS — H353132 Nonexudative age-related macular degeneration, bilateral, intermediate dry stage: Secondary | ICD-10-CM | POA: Diagnosis not present

## 2019-10-04 DIAGNOSIS — G894 Chronic pain syndrome: Secondary | ICD-10-CM | POA: Diagnosis not present

## 2019-10-04 DIAGNOSIS — M47812 Spondylosis without myelopathy or radiculopathy, cervical region: Secondary | ICD-10-CM | POA: Diagnosis not present

## 2019-10-04 DIAGNOSIS — M5416 Radiculopathy, lumbar region: Secondary | ICD-10-CM | POA: Diagnosis not present

## 2019-10-04 DIAGNOSIS — M961 Postlaminectomy syndrome, not elsewhere classified: Secondary | ICD-10-CM | POA: Diagnosis not present

## 2019-10-05 DIAGNOSIS — M9973 Connective tissue and disc stenosis of intervertebral foramina of lumbar region: Secondary | ICD-10-CM | POA: Diagnosis not present

## 2019-10-05 DIAGNOSIS — M4326 Fusion of spine, lumbar region: Secondary | ICD-10-CM | POA: Diagnosis not present

## 2019-11-19 ENCOUNTER — Other Ambulatory Visit: Payer: Self-pay | Admitting: Internal Medicine

## 2019-11-19 DIAGNOSIS — Z1231 Encounter for screening mammogram for malignant neoplasm of breast: Secondary | ICD-10-CM

## 2019-11-20 DIAGNOSIS — R5381 Other malaise: Secondary | ICD-10-CM | POA: Diagnosis not present

## 2019-11-20 DIAGNOSIS — J329 Chronic sinusitis, unspecified: Secondary | ICD-10-CM | POA: Diagnosis not present

## 2019-11-20 DIAGNOSIS — Z20818 Contact with and (suspected) exposure to other bacterial communicable diseases: Secondary | ICD-10-CM | POA: Diagnosis not present

## 2019-11-20 DIAGNOSIS — R109 Unspecified abdominal pain: Secondary | ICD-10-CM | POA: Diagnosis not present

## 2019-11-20 DIAGNOSIS — N390 Urinary tract infection, site not specified: Secondary | ICD-10-CM | POA: Diagnosis not present

## 2019-11-27 DIAGNOSIS — I10 Essential (primary) hypertension: Secondary | ICD-10-CM | POA: Diagnosis not present

## 2019-12-06 DIAGNOSIS — Z23 Encounter for immunization: Secondary | ICD-10-CM | POA: Diagnosis not present

## 2019-12-24 DIAGNOSIS — R0781 Pleurodynia: Secondary | ICD-10-CM | POA: Diagnosis not present

## 2019-12-24 DIAGNOSIS — I10 Essential (primary) hypertension: Secondary | ICD-10-CM | POA: Diagnosis not present

## 2019-12-24 DIAGNOSIS — R9431 Abnormal electrocardiogram [ECG] [EKG]: Secondary | ICD-10-CM | POA: Diagnosis not present

## 2019-12-27 ENCOUNTER — Other Ambulatory Visit: Payer: Self-pay

## 2019-12-27 ENCOUNTER — Ambulatory Visit (INDEPENDENT_AMBULATORY_CARE_PROVIDER_SITE_OTHER): Payer: Medicare Other | Admitting: Cardiology

## 2019-12-27 ENCOUNTER — Encounter: Payer: Self-pay | Admitting: Cardiology

## 2019-12-27 VITALS — BP 130/63 | HR 68 | Temp 97.3°F | Ht 63.0 in | Wt 147.0 lb

## 2019-12-27 DIAGNOSIS — R079 Chest pain, unspecified: Secondary | ICD-10-CM | POA: Diagnosis not present

## 2019-12-27 DIAGNOSIS — E785 Hyperlipidemia, unspecified: Secondary | ICD-10-CM

## 2019-12-27 DIAGNOSIS — R9431 Abnormal electrocardiogram [ECG] [EKG]: Secondary | ICD-10-CM

## 2019-12-27 DIAGNOSIS — I10 Essential (primary) hypertension: Secondary | ICD-10-CM | POA: Diagnosis not present

## 2019-12-27 MED ORDER — METOPROLOL TARTRATE 50 MG PO TABS: ORAL_TABLET | ORAL | 0 refills | Status: DC

## 2019-12-27 NOTE — Progress Notes (Signed)
Cardiology Office Note:    Date:  01/05/2020   ID:  April Berg, April Berg 09/08/1946, MRN [id number]  PCP:  Marton Redwood, MD  Cardiologist:  No primary care provider on file.  Electrophysiologist:  None   Referring MD: Marton Redwood, MD   Chief Complaint  Patient presents with  . Chest Pain    History of Present Illness:    April Berg is a 74 y.o. female with a hx of hypertension, hypothyroidism, allergy-induced asthma who presents with chest pain.  Reports pain started last Sunday.  Had been in car for 6 hours and kept reaching in back seat.  Pain around left breast.  Also with shoulder pain.  Worse with taking breath.  D-dimer test was negative.  Chest pain almost resolved.  Has been continuous pain, worse with deep breathing.  Denies any chest pain prior to this.  States that she has not been exercising much recently but in warm weather will walk for 1 to 2 miles.  Denies any chest pain or dyspnea.  LDL 187 on 01/01/2019.  She has tried multiple statins but been unable to tolerate.  New smoking history.  No history of heart disease in immediate family.  Reports BP usually well controlled, usually BP is 110/65.  Past Medical History:  Diagnosis Date  . Anxiety   . Arthritis    osteoarthritis  . Cataract   . Complication of anesthesia 90's   block for elbow surgery and pt had a seizure... surgery since with no problem   . Depression   . GERD (gastroesophageal reflux disease)   . Glaucoma of both eyes   . H/O hiatal hernia   . History of pericarditis    DEC 2008 AND SMALL PERICARDIAL EFFUSION--   RESOLVED  . Hyperlipidemia   . Hypertension   . Hypothyroidism   . IBS (irritable bowel syndrome)   . Interstitial cystitis   . Migraines   . Pelvic pain   . Seizures ([place])    one occurence in the 1990's after anesthesia, no problems since  . Spinal stenosis   . Vitamin D deficiency     Past Surgical History:  Procedure Laterality Date  . ANTERIOR CERVICAL  DECOMP/DISCECTOMY FUSION  12/08/2011   Procedure: ANTERIOR CERVICAL DECOMPRESSION/DISCECTOMY FUSION 3 LEVELS;  Surgeon: Sinclair Ship, MD;  Location: [facility];  Service: Orthopedics;  Laterality: N/A;  C 3-6 ACDF  . BREAST BIOPSY Right   . CARDIAC CATHETERIZATION  10-27-2007  DR Eustace Quail   NORMAL CORONARY ARTERIES/ NORMAL LVF  . CARDIOVASCULAR STRESS TEST  02-25-2010  DR CRENSHAW   ANTERIOR ATTENUATION NO SCAR OR ISCHEMIA/ EF 81%  . CATARACT EXTRACTION W/ INTRAOCULAR LENS  IMPLANT, BILATERAL    . CESAREAN SECTION  1975  . COLONOSCOPY    . CYSTO WITH HYDRODISTENSION N/A 06/14/2013   Procedure: CYSTOSCOPY/HYDRODISTENSION/INSTILLATION OF MARCAINE AND PYRIDIUM;  Surgeon: Reece Packer, MD;  Location: [facility];  Service: Urology;  Laterality: N/A;  . ELBOW TENDON RELEASE Left 1996  . GLAUCOMA SURGERY Bilateral 2004  . RHINOPLASTY  2010  . THUMB TENDON TRANSPOSITION Right 2012  . TONSILLECTOMY  1951  . TOTAL ABDOMINAL HYSTERECTOMY W/ BILATERAL SALPINGOOPHORECTOMY  1982  . TRANSTHORACIC ECHOCARDIOGRAM  03-04-2010   MODERATE LVH/ NORMAL LVSF/ MILD DIASTOLIC DYSFUNCTION/ EF [id number] MILD AI  . UPPER GASTROINTESTINAL ENDOSCOPY      Current Medications: Current Meds  Medication Sig  . azithromycin (ZITHROMAX) 250 MG tablet Take 1 tablet (250 mg  total) by mouth daily. Take first 2 tablets together, then 1 every day until finished.  . baclofen (LIORESAL) 10 MG tablet Take 10 mg by mouth 4 (four) times daily as needed for muscle spasms.  . cholecalciferol (VITAMIN D) 1000 units tablet Take 2,000 Units by mouth daily.  . cyclobenzaprine (FLEXERIL) 10 MG tablet Flexeril 10 mg tablet  Take 1 tablet every 8 hours by oral route as needed for spasm for 10 days.  Marland Kitchen dexlansoprazole (DEXILANT) 60 MG capsule Take 60 mg by mouth daily.  Marland Kitchen escitalopram (LEXAPRO) 10 MG tablet Take 10 mg by mouth daily.   . famciclovir (FAMVIR) 500 MG tablet Take by mouth 3 (three) times daily as  needed (outbreak).   . fexofenadine (ALLEGRA) 180 MG tablet Take 180 mg by mouth daily.  . fluticasone (FLONASE) 50 MCG/ACT nasal spray Place 1 spray into both nostrils daily as needed for allergies.   Marland Kitchen gabapentin (NEURONTIN) 300 MG capsule Take 300 mg by mouth 3 (three) times daily.  Marland Kitchen guaiFENesin (MUCINEX) 600 MG 12 hr tablet Take 600 mg by mouth 2 (two) times daily as needed for cough.  . levothyroxine (SYNTHROID, LEVOTHROID) 125 MCG tablet Take 125 mcg by mouth daily before breakfast.  . LORazepam (ATIVAN) 1 MG tablet Take 1 mg by mouth daily as needed.  . meclizine (ANTIVERT) 50 MG tablet Take 1 tablet (50 mg total) by mouth 3 (three) times daily as needed.  . Multiple Vitamins-Minerals (MULTIVITAMINS THER. W/MINERALS) TABS Take 1 tablet by mouth daily.  Marland Kitchen oxyCODONE (OXY IR/ROXICODONE) 5 MG immediate release tablet Take 10-325 mg by mouth daily as needed for moderate pain or severe pain.   . polyethylene glycol (MIRALAX) packet Use three times daily until bowels move - Max 3 consecutive days  . SUMAtriptan (IMITREX) 100 MG tablet Take 1 tablet (100 mg total) by mouth every 2 (two) hours as needed for migraine. May repeat in 2 hours if headache persists or recurs.  . SYMBICORT 80-4.5 MCG/ACT inhaler Inhale 1 puff into the lungs daily.  Marland Kitchen telmisartan (MICARDIS) 80 MG tablet Take 40 mg by mouth daily.   . timolol (TIMOPTIC) 0.5 % ophthalmic solution Place 1 drop into both eyes daily.   Marland Kitchen zolpidem (AMBIEN) 10 MG tablet Take 10 mg by mouth at bedtime as needed.   Current Facility-Administered Medications for the 12/27/19 encounter (Office Visit) with Donato Heinz, MD  Medication  . 0.9 %  sodium chloride infusion     Allergies:   Moxifloxacin hcl in nacl, Nsaids, Sulfa antibiotics, Adhesive [tape], Aspirin, Avelox [moxifloxacin], Chlorthalidone, Hydrochlorothiazide, and Neomycin   Social History   Socioeconomic History  . Marital status: Married    Spouse name: Not on file  .  Number of children: 1  . Years of education: Not on file  . Highest education level: Not on file  Occupational History  . Occupation: Retired  Tobacco Use  . Smoking status: Never Smoker  . Smokeless tobacco: Never Used  Substance and Sexual Activity  . Alcohol use: Yes    Alcohol/week: 2.0 standard drinks    Types: 2 Glasses of wine per week  . Drug use: No  . Sexual activity: Not on file  Other Topics Concern  . Not on file  Social History Narrative  . Not on file   Social Determinants of Health   Financial Resource Strain:   . Difficulty of Paying Living Expenses: Not on file  Food Insecurity:   . Worried About Estate manager/land agent  of Food in the Last Year: Not on file  . Ran Out of Food in the Last Year: Not on file  Transportation Needs:   . Lack of Transportation (Medical): Not on file  . Lack of Transportation (Non-Medical): Not on file  Physical Activity:   . Days of Exercise per Week: Not on file  . Minutes of Exercise per Session: Not on file  Stress:   . Feeling of Stress : Not on file  Social Connections:   . Frequency of Communication with Friends and Family: Not on file  . Frequency of Social Gatherings with Friends and Family: Not on file  . Attends Religious Services: Not on file  . Active Member of Clubs or Organizations: Not on file  . Attends Archivist Meetings: Not on file  . Marital Status: Not on file     Family History: The patient's family history includes Lung disease in her father; Pulmonary fibrosis in her mother. There is no history of Colon cancer, Esophageal cancer, Rectal cancer, or Stomach cancer.  ROS:   Please see the history of present illness.    All other systems reviewed and are negative.  EKGs/Labs/Other Studies Reviewed:    The following studies were reviewed today:   EKG:  EKG is ordered today.  The ekg ordered today demonstrates normal sinus rhythm, rate 68, T wave inversions in leads V3-5  Recent Labs: 08/26/2019:  ALT 16; BUN 21; Creatinine, Ser 0.88; Hemoglobin 14.5; Platelets 231; Potassium 4.6; Sodium 139  Recent Lipid Panel No results found for: CHOL, TRIG, HDL, CHOLHDL, VLDL, LDLCALC, LDLDIRECT  Physical Exam:    VS:  BP 130/63   Pulse 68   Temp (!) 97.3 F (36.3 C)   Ht 5\' 3"  (1.6 m)   Wt 147 lb (66.7 kg)   SpO2 97%   BMI 26.04 kg/m     Wt Readings from Last 3 Encounters:  12/27/19 147 lb (66.7 kg)  08/26/19 153 lb (69.4 kg)  01/29/19 136 lb (61.7 kg)     GEN: Well nourished, well developed in no acute distress HEENT: Normal NECK: No JVD CARDIAC: RRR, no murmurs, rubs, gallops RESPIRATORY:  Clear to auscultation without rales, wheezing or rhonchi  ABDOMEN: Soft, non-tender, non-distended MUSCULOSKELETAL:  No edema; No deformity  SKIN: Warm and dry NEUROLOGIC:  Alert and oriented x 3 PSYCHIATRIC:  Normal affect   ASSESSMENT:    1. Chest pain, unspecified type   2. Essential hypertension   3. Abnormal EKG   4. Hyperlipidemia, unspecified hyperlipidemia type    PLAN:    In order of problems listed above:  Chest pain: Atypical in description but considering risk factors (age, hypertension, hyperlipidemia with inability to tolerate statins) would classify as intermediate risk of obstructive coronary disease -Coronary CTA  Hypertension: On telmisartan 40 mg daily.  Appears controlled  Hyperlipidemia: LDL 187 on 01/01/2019.  Has been unable to tolerate any statins.  Will follow up results of coronary CTA, if significant coronary disease then will likely need PCSK9 inhibitor  RTC in 3 months  Medication Adjustments/Labs and Tests Ordered: Current medicines are reviewed at length with the patient today.  Concerns regarding medicines are outlined above.  Orders Placed This Encounter  Procedures  . CT CORONARY MORPH W/CTA COR W/SCORE W/CA W/CM &/OR WO/CM  . CT CORONARY FRACTIONAL FLOW RESERVE DATA PREP  . CT CORONARY FRACTIONAL FLOW RESERVE FLUID ANALYSIS  . Basic  metabolic panel  . EKG 12-Lead   Meds ordered this  encounter  Medications  . metoprolol tartrate (LOPRESSOR) 50 MG tablet    Sig: Take 50 mg (1 tablet) TWO hours prior to CT    Dispense:  1 tablet    Refill:  0    Patient Instructions  Medication Instructions:  Your physician recommends that you continue on your current medications as directed. Please refer to the Current Medication list given to you today.  *If you need a refill on your cardiac medications before your next appointment, please call your pharmacy*  Lab Work: BMET ONE [place] If you have labs (blood work) drawn today and your tests are completely normal, you will receive your results only by: Marland Kitchen MyChart Message (if you have MyChart) OR . A paper copy in the mail If you have any lab test that is abnormal or we need to change your treatment, we will call you to review the results.  Testing/Procedures: Your physician has requested that you have cardiac CT. Cardiac computed tomography (CT) is a painless test that uses an x-ray machine to take clear, detailed pictures of your heart. For further information please visit HugeFiesta.tn. Please follow instruction sheet as given.  Follow-Up: At [facility], you and your health needs are our priority.  As part of our continuing mission to provide you with exceptional heart care, we have created designated Provider Care Teams.  These Care Teams include your primary Cardiologist (physician) and Advanced Practice Providers (APPs -  Physician Assistants and Nurse Practitioners) who all work together to provide you with the care you need, when you need it.  Your next appointment:   3 month(s)  The format for your next appointment:   In Person  Provider:   Oswaldo Milian, MD  Other Instructions   Your cardiac CT will be scheduled at one of the below locations:   [facility] [address] [place], [place] [address] [phone]  [facility] [address] [place], [place] [address] [phone]  If scheduled at [facility], please arrive at the [facility] main entrance of [facility] 30 minutes prior to test start time. Proceed to the [facility] Radiology Department (first floor) to check-in and test prep.  If scheduled at [facility], please arrive 15 mins early for check-in and test prep.  Please follow these instructions carefully (unless otherwise directed):  Hold all erectile dysfunction medications at least 3 days (72 hrs) prior to test.  On the Night Before the Test: . Be sure to Drink plenty of water. . Do not consume any caffeinated/decaffeinated beverages or chocolate 12 hours prior to your test. . Do not take any antihistamines 12 hours prior to your test. . If the patient has contrast allergy: ? Patient will need a prescription for Prednisone and very clear instructions (as follows): 1. Prednisone 50 mg - take 13 hours prior to test 2. Take another Prednisone 50 mg 7 hours prior to test 3. Take another Prednisone 50 mg 1 hour prior to test 4. Take Benadryl 50 mg 1 hour prior to test . Patient must complete all four doses of above prophylactic medications. . Patient will need a ride after test due to Benadryl.  On the Day of the Test: . Drink plenty of water. Do not drink any water within one hour of the test. . Do not eat any food 4 hours prior to the test. . You may take your regular medications  prior to the test.  . Take metoprolol (Lopressor) two hours prior to test. . HOLD Furosemide/Hydrochlorothiazide morning of the test. . FEMALES- please wear underwire-free bra if available       After the Test: . Drink plenty of water. . After receiving IV contrast, you may experience a mild flushed feeling. This is normal. . On occasion, you may experience a mild rash up to 24 hours after the  test. This is not dangerous. If this occurs, you can take Benadryl 25 mg and increase your fluid intake. . If you experience trouble breathing, this can be serious. If it is severe call 911 IMMEDIATELY. If it is mild, please call our office. . If you take any of these medications: Glipizide/Metformin, Avandament, Glucavance, please do not take 48 hours after completing test unless otherwise instructed.   Once we have confirmed authorization from your insurance company, we will call you to set up a date and time for your test.   For non-scheduling related questions, please contact the cardiac imaging nurse navigator should you have any questions/concerns: Marchia Bond, RN Navigator Cardiac Imaging [facility] [phone] mobile       Signed, Donato Heinz, MD  01/05/2020 5:56 PM    [place]

## 2019-12-27 NOTE — Patient Instructions (Addendum)
Medication Instructions:  Your physician recommends that you continue on your current medications as directed. Please refer to the Current Medication list given to you today.  *If you need a refill on your cardiac medications before your next appointment, please call your pharmacy*  Lab Work: BMET ONE [place] If you have labs (blood work) drawn today and your tests are completely normal, you will receive your results only by: Marland Kitchen MyChart Message (if you have MyChart) OR . A paper copy in the mail If you have any lab test that is abnormal or we need to change your treatment, we will call you to review the results.  Testing/Procedures: Your physician has requested that you have cardiac CT. Cardiac computed tomography (CT) is a painless test that uses an x-ray machine to take clear, detailed pictures of your heart. For further information please visit HugeFiesta.tn. Please follow instruction sheet as given.  Follow-Up: At [facility], you and your health needs are our priority.  As part of our continuing mission to provide you with exceptional heart care, we have created designated Provider Care Teams.  These Care Teams include your primary Cardiologist (physician) and Advanced Practice Providers (APPs -  Physician Assistants and Nurse Practitioners) who all work together to provide you with the care you need, when you need it.  Your next appointment:   3 month(s)  The format for your next appointment:   In Person  Provider:   Oswaldo Milian, MD  Other Instructions   Your cardiac CT will be scheduled at one of the below locations:   [facility] [address] [place], [place] [address] [phone]  [place] [address] [place], [place] [address] [phone]  If scheduled at [facility], please arrive at the [facility] main entrance of [facility] 30 minutes prior  to test start time. Proceed to the [facility] Radiology Department (first floor) to check-in and test prep.  If scheduled at [facility], please arrive 15 mins early for check-in and test prep.  Please follow these instructions carefully (unless otherwise directed):  Hold all erectile dysfunction medications at least 3 days (72 hrs) prior to test.  On the Night Before the Test: . Be sure to Drink plenty of water. . Do not consume any caffeinated/decaffeinated beverages or chocolate 12 hours prior to your test. . Do not take any antihistamines 12 hours prior to your test. . If the patient has contrast allergy: ? Patient will need a prescription for Prednisone and very clear instructions (as follows): 1. Prednisone 50 mg - take 13 hours prior to test 2. Take another Prednisone 50 mg 7 hours prior to test 3. Take another Prednisone 50 mg 1 hour prior to test 4. Take Benadryl 50 mg 1 hour prior to test . Patient must complete all four doses of above prophylactic medications. . Patient will need a ride after test due to Benadryl.  On the Day of the Test: . Drink plenty of water. Do not drink any water within one hour of the test. . Do not eat any food 4 hours prior to the test. . You may take your regular medications prior to the test.  . Take metoprolol (Lopressor) two hours prior to test. . HOLD Furosemide/Hydrochlorothiazide morning of the test. . FEMALES- please wear underwire-free bra if available       After the Test: . Drink plenty of water. Marland Kitchen  After receiving IV contrast, you may experience a mild flushed feeling. This is normal. . On occasion, you may experience a mild rash up to 24 hours after the test. This is not dangerous. If this occurs, you can take Benadryl 25 mg and increase your fluid intake. . If you experience trouble breathing, this can be serious. If it is severe call 911 IMMEDIATELY. If it is mild, please call our office. . If you take  any of these medications: Glipizide/Metformin, Avandament, Glucavance, please do not take 48 hours after completing test unless otherwise instructed.   Once we have confirmed authorization from your insurance company, we will call you to set up a date and time for your test.   For non-scheduling related questions, please contact the cardiac imaging nurse navigator should you have any questions/concerns: Marchia Bond, RN Navigator Cardiac Imaging [facility] [phone] mobile

## 2019-12-31 DIAGNOSIS — Z9622 Myringotomy tube(s) status: Secondary | ICD-10-CM | POA: Diagnosis not present

## 2019-12-31 DIAGNOSIS — Z7289 Other problems related to lifestyle: Secondary | ICD-10-CM | POA: Diagnosis not present

## 2019-12-31 DIAGNOSIS — Z9089 Acquired absence of other organs: Secondary | ICD-10-CM | POA: Diagnosis not present

## 2019-12-31 DIAGNOSIS — H6121 Impacted cerumen, right ear: Secondary | ICD-10-CM | POA: Diagnosis not present

## 2019-12-31 DIAGNOSIS — H903 Sensorineural hearing loss, bilateral: Secondary | ICD-10-CM | POA: Diagnosis not present

## 2019-12-31 DIAGNOSIS — Z974 Presence of external hearing-aid: Secondary | ICD-10-CM | POA: Diagnosis not present

## 2019-12-31 DIAGNOSIS — J343 Hypertrophy of nasal turbinates: Secondary | ICD-10-CM | POA: Diagnosis not present

## 2020-01-01 DIAGNOSIS — Z23 Encounter for immunization: Secondary | ICD-10-CM | POA: Diagnosis not present

## 2020-01-01 DIAGNOSIS — N3010 Interstitial cystitis (chronic) without hematuria: Secondary | ICD-10-CM | POA: Diagnosis not present

## 2020-01-01 DIAGNOSIS — K581 Irritable bowel syndrome with constipation: Secondary | ICD-10-CM | POA: Diagnosis not present

## 2020-01-07 ENCOUNTER — Other Ambulatory Visit (HOSPITAL_COMMUNITY): Payer: Self-pay | Admitting: Internal Medicine

## 2020-01-07 ENCOUNTER — Ambulatory Visit: Payer: Medicare Other

## 2020-01-07 DIAGNOSIS — R9431 Abnormal electrocardiogram [ECG] [EKG]: Secondary | ICD-10-CM

## 2020-01-08 ENCOUNTER — Ambulatory Visit (HOSPITAL_COMMUNITY): Payer: Medicare Other | Attending: Cardiovascular Disease

## 2020-01-08 ENCOUNTER — Other Ambulatory Visit: Payer: Self-pay

## 2020-01-08 DIAGNOSIS — R9431 Abnormal electrocardiogram [ECG] [EKG]: Secondary | ICD-10-CM

## 2020-01-08 DIAGNOSIS — E7849 Other hyperlipidemia: Secondary | ICD-10-CM | POA: Diagnosis not present

## 2020-01-08 DIAGNOSIS — E038 Other specified hypothyroidism: Secondary | ICD-10-CM | POA: Diagnosis not present

## 2020-01-08 DIAGNOSIS — M810 Age-related osteoporosis without current pathological fracture: Secondary | ICD-10-CM | POA: Diagnosis not present

## 2020-01-15 DIAGNOSIS — K219 Gastro-esophageal reflux disease without esophagitis: Secondary | ICD-10-CM | POA: Diagnosis not present

## 2020-01-15 DIAGNOSIS — G4700 Insomnia, unspecified: Secondary | ICD-10-CM | POA: Diagnosis not present

## 2020-01-15 DIAGNOSIS — M4800 Spinal stenosis, site unspecified: Secondary | ICD-10-CM | POA: Diagnosis not present

## 2020-01-15 DIAGNOSIS — Z1331 Encounter for screening for depression: Secondary | ICD-10-CM | POA: Diagnosis not present

## 2020-01-15 DIAGNOSIS — Z0000 Encounter for general adult medical examination without abnormal findings: Secondary | ICD-10-CM | POA: Diagnosis not present

## 2020-01-15 DIAGNOSIS — M810 Age-related osteoporosis without current pathological fracture: Secondary | ICD-10-CM | POA: Diagnosis not present

## 2020-01-15 DIAGNOSIS — J45909 Unspecified asthma, uncomplicated: Secondary | ICD-10-CM | POA: Diagnosis not present

## 2020-01-15 DIAGNOSIS — I10 Essential (primary) hypertension: Secondary | ICD-10-CM | POA: Diagnosis not present

## 2020-01-15 DIAGNOSIS — F3342 Major depressive disorder, recurrent, in full remission: Secondary | ICD-10-CM | POA: Diagnosis not present

## 2020-01-15 DIAGNOSIS — Z1339 Encounter for screening examination for other mental health and behavioral disorders: Secondary | ICD-10-CM | POA: Diagnosis not present

## 2020-01-15 DIAGNOSIS — E039 Hypothyroidism, unspecified: Secondary | ICD-10-CM | POA: Diagnosis not present

## 2020-01-15 DIAGNOSIS — R9431 Abnormal electrocardiogram [ECG] [EKG]: Secondary | ICD-10-CM | POA: Diagnosis not present

## 2020-01-15 DIAGNOSIS — E785 Hyperlipidemia, unspecified: Secondary | ICD-10-CM | POA: Diagnosis not present

## 2020-01-17 DIAGNOSIS — R82998 Other abnormal findings in urine: Secondary | ICD-10-CM | POA: Diagnosis not present

## 2020-01-17 DIAGNOSIS — I10 Essential (primary) hypertension: Secondary | ICD-10-CM | POA: Diagnosis not present

## 2020-01-29 DIAGNOSIS — D3132 Benign neoplasm of left choroid: Secondary | ICD-10-CM | POA: Diagnosis not present

## 2020-01-29 DIAGNOSIS — H353132 Nonexudative age-related macular degeneration, bilateral, intermediate dry stage: Secondary | ICD-10-CM | POA: Diagnosis not present

## 2020-01-29 DIAGNOSIS — Z961 Presence of intraocular lens: Secondary | ICD-10-CM | POA: Diagnosis not present

## 2020-01-29 DIAGNOSIS — H35373 Puckering of macula, bilateral: Secondary | ICD-10-CM | POA: Diagnosis not present

## 2020-01-29 DIAGNOSIS — D3131 Benign neoplasm of right choroid: Secondary | ICD-10-CM | POA: Diagnosis not present

## 2020-01-30 ENCOUNTER — Other Ambulatory Visit (HOSPITAL_COMMUNITY): Payer: Self-pay

## 2020-01-31 ENCOUNTER — Other Ambulatory Visit: Payer: Self-pay

## 2020-01-31 ENCOUNTER — Ambulatory Visit (HOSPITAL_COMMUNITY)
Admission: RE | Admit: 2020-01-31 | Discharge: 2020-01-31 | Disposition: A | Discharge status: Home / Self Care | Payer: Medicare Other | Source: Ambulatory Visit | Attending: Internal Medicine | Admitting: Internal Medicine

## 2020-01-31 DIAGNOSIS — R9431 Abnormal electrocardiogram [ECG] [EKG]: Secondary | ICD-10-CM | POA: Diagnosis not present

## 2020-01-31 DIAGNOSIS — M810 Age-related osteoporosis without current pathological fracture: Secondary | ICD-10-CM | POA: Diagnosis not present

## 2020-01-31 DIAGNOSIS — R079 Chest pain, unspecified: Secondary | ICD-10-CM | POA: Diagnosis not present

## 2020-01-31 LAB — BASIC METABOLIC PANEL
BUN/Creatinine Ratio: 24 (ref 12–28)
BUN: 25 mg/dL (ref 8–27)
CO2: 24 mmol/L (ref 20–29)
Calcium: 8.8 mg/dL (ref 8.7–10.3)
Chloride: 97 mmol/L (ref 96–106)
Creatinine, Ser: 1.03 mg/dL — ABNORMAL HIGH (ref 0.57–1.00)
GFR calc Af Amer: 63 mL/min/{1.73_m2} (ref >59)
GFR calc non Af Amer: 55 mL/min/{1.73_m2} — ABNORMAL LOW (ref >59)
Glucose: 94 mg/dL (ref 65–99)
Potassium: 5.6 mmol/L — ABNORMAL HIGH (ref 3.5–5.2)
Sodium: 133 mmol/L — ABNORMAL LOW (ref 134–144)

## 2020-01-31 MED ORDER — ZOLEDRONIC ACID 5 MG/100ML IV SOLN
INTRAVENOUS | Status: AC
  Administered 2020-01-31: 10:00:00 5 mg via INTRAVENOUS
  Filled 2020-01-31: qty 100

## 2020-01-31 MED ORDER — ZOLEDRONIC ACID 5 MG/100ML IV SOLN: 5.0000 mg | Freq: Once | INTRAVENOUS | Status: AC

## 2020-02-01 ENCOUNTER — Telehealth: Payer: Self-pay | Admitting: Cardiology

## 2020-02-01 DIAGNOSIS — I10 Essential (primary) hypertension: Secondary | ICD-10-CM | POA: Diagnosis not present

## 2020-02-01 DIAGNOSIS — E875 Hyperkalemia: Secondary | ICD-10-CM

## 2020-02-01 NOTE — Telephone Encounter (Signed)
New Message  Pt is returning call to retrieve results  Please call

## 2020-02-01 NOTE — Addendum Note (Signed)
Addended by: Cain Sieve on: 02/01/2020 08:38 AM   Modules accepted: Orders

## 2020-02-01 NOTE — Telephone Encounter (Signed)
Sounds reasonable, can continue telmisartan and recheck BMET next week

## 2020-02-01 NOTE — Telephone Encounter (Signed)
Contacted patient back- she states that she went today to [place] office today and had her labs rechecked, but she will see what happens and if any problems she will let us know.  Will notify MD. Thanks!

## 2020-02-01 NOTE — Telephone Encounter (Signed)
Pt states that she has tried every BP medication with Dr. Brigitte Pulse and does not tolerate any BP medications but Telmisartan. She states that she is very reluctant about trying something new because this is the only medication that has worked. She also stated that she gets osteoporosis infusions called "Reclast IV" and she had that 1 hr prior to lab draw. She also states that she has been on a diet for the past 3 months that includes a lot green leafy vegetables. Recommended pt come back in one week and get labs re-drawn - not on the same day as her infusion - educated pt to stay away from potassium rich foods and to stay hydrated. Verbalized understanding. Put in orders for labs and mailed slips.

## 2020-02-01 NOTE — Telephone Encounter (Signed)
LM2CB for results.

## 2020-02-06 ENCOUNTER — Encounter (HOSPITAL_COMMUNITY): Payer: Self-pay

## 2020-02-06 ENCOUNTER — Telehealth (HOSPITAL_COMMUNITY): Payer: Self-pay | Admitting: Emergency Medicine

## 2020-02-06 NOTE — Telephone Encounter (Signed)
Left message on voicemail with name and callback number Mustafa Potts RN Navigator Cardiac Imaging [facility] [phone] Office [phone] Cell  

## 2020-02-07 ENCOUNTER — Encounter (HOSPITAL_COMMUNITY): Payer: Self-pay | Admitting: Emergency Medicine

## 2020-02-07 ENCOUNTER — Other Ambulatory Visit: Payer: Self-pay

## 2020-02-07 ENCOUNTER — Ambulatory Visit (HOSPITAL_COMMUNITY)
Admission: RE | Admit: 2020-02-07 | Discharge: 2020-02-07 | Disposition: A | Discharge status: Home / Self Care | Payer: Medicare Other | Source: Ambulatory Visit | Attending: Cardiology | Admitting: Cardiology

## 2020-02-07 DIAGNOSIS — R9431 Abnormal electrocardiogram [ECG] [EKG]: Secondary | ICD-10-CM | POA: Insufficient documentation

## 2020-02-07 DIAGNOSIS — R079 Chest pain, unspecified: Secondary | ICD-10-CM | POA: Insufficient documentation

## 2020-02-07 DIAGNOSIS — Z006 Encounter for examination for normal comparison and control in clinical research program: Secondary | ICD-10-CM

## 2020-02-07 MED ORDER — NITROGLYCERIN 0.4 MG SL SUBL
0.8000 mg | SUBLINGUAL_TABLET | Freq: Once | SUBLINGUAL | Status: AC
  Administered 2020-02-07: 11:00:00 0.8 mg via SUBLINGUAL

## 2020-02-07 MED ORDER — NITROGLYCERIN 0.4 MG SL SUBL
SUBLINGUAL_TABLET | SUBLINGUAL | Status: AC
  Filled 2020-02-07: qty 2

## 2020-02-07 MED ORDER — IOHEXOL 350 MG/ML SOLN
100.0000 mL | Freq: Once | INTRAVENOUS | Status: AC | PRN
  Administered 2020-02-07: 100 mL via INTRAVENOUS

## 2020-02-07 NOTE — Research (Signed)
Cadfem Informed Consent    Patient Name: April Berg    Subject met inclusion and exclusion criteria.  The informed consent form, study requirements and expectations were reviewed with the subject and questions and concerns were addressed prior to the signing of the consent form.  The subject verbalized understanding of the trail requirements.  The subject agreed to participate in the CADFEM trial and signed the informed consent.  The informed consent was obtained prior to performance of any protocol-specific procedures for the subject.  A copy of the signed informed consent was given to the subject and a copy was placed in the subject's medical record.   Neva Seat

## 2020-02-08 ENCOUNTER — Ambulatory Visit
Admission: RE | Admit: 2020-02-08 | Discharge: 2020-02-08 | Disposition: A | Discharge status: Home / Self Care | Payer: Medicare Other | Source: Ambulatory Visit | Attending: Internal Medicine | Admitting: Internal Medicine

## 2020-02-08 DIAGNOSIS — Z1231 Encounter for screening mammogram for malignant neoplasm of breast: Secondary | ICD-10-CM | POA: Diagnosis not present

## 2020-02-11 ENCOUNTER — Ambulatory Visit (HOSPITAL_COMMUNITY): Payer: Medicare Other

## 2020-02-11 DIAGNOSIS — E875 Hyperkalemia: Secondary | ICD-10-CM | POA: Diagnosis not present

## 2020-02-12 LAB — BASIC METABOLIC PANEL
BUN/Creatinine Ratio: 34 — ABNORMAL HIGH (ref 12–28)
BUN: 31 mg/dL — ABNORMAL HIGH (ref 8–27)
CO2: 27 mmol/L (ref 20–29)
Calcium: 9.2 mg/dL (ref 8.7–10.3)
Chloride: 98 mmol/L (ref 96–106)
Creatinine, Ser: 0.91 mg/dL (ref 0.57–1.00)
GFR calc Af Amer: 73 mL/min/{1.73_m2} (ref >59)
GFR calc non Af Amer: 64 mL/min/{1.73_m2} (ref >59)
Glucose: 80 mg/dL (ref 65–99)
Potassium: 5.6 mmol/L — ABNORMAL HIGH (ref 3.5–5.2)
Sodium: 136 mmol/L (ref 134–144)

## 2020-02-18 ENCOUNTER — Telehealth: Payer: Self-pay | Admitting: Cardiology

## 2020-02-18 MED ORDER — AMLODIPINE BESYLATE 5 MG PO TABS: 5.0000 mg | ORAL_TABLET | Freq: Every day | ORAL | 3 refills | Status: DC

## 2020-02-18 NOTE — Telephone Encounter (Signed)
Patient was returning a call from [facility] to go over her lab results. Please call back

## 2020-02-18 NOTE — Telephone Encounter (Signed)
Contacted patient, gave lab results. Patient verbalized understanding.  Called in RX to pharmacy.

## 2020-02-19 ENCOUNTER — Encounter (HOSPITAL_COMMUNITY): Payer: Medicare Other

## 2020-03-13 DIAGNOSIS — M5442 Lumbago with sciatica, left side: Secondary | ICD-10-CM | POA: Diagnosis not present

## 2020-03-13 DIAGNOSIS — M4326 Fusion of spine, lumbar region: Secondary | ICD-10-CM | POA: Diagnosis not present

## 2020-03-13 DIAGNOSIS — M4807 Spinal stenosis, lumbosacral region: Secondary | ICD-10-CM | POA: Diagnosis not present

## 2020-03-13 DIAGNOSIS — M9973 Connective tissue and disc stenosis of intervertebral foramina of lumbar region: Secondary | ICD-10-CM | POA: Diagnosis not present

## 2020-03-13 DIAGNOSIS — G8929 Other chronic pain: Secondary | ICD-10-CM | POA: Diagnosis not present

## 2020-03-21 DIAGNOSIS — Z981 Arthrodesis status: Secondary | ICD-10-CM | POA: Diagnosis not present

## 2020-03-21 DIAGNOSIS — M4720 Other spondylosis with radiculopathy, site unspecified: Secondary | ICD-10-CM | POA: Diagnosis not present

## 2020-03-25 DIAGNOSIS — H532 Diplopia: Secondary | ICD-10-CM | POA: Diagnosis not present

## 2020-03-25 DIAGNOSIS — H401131 Primary open-angle glaucoma, bilateral, mild stage: Secondary | ICD-10-CM | POA: Diagnosis not present

## 2020-03-25 DIAGNOSIS — H5211 Myopia, right eye: Secondary | ICD-10-CM | POA: Diagnosis not present

## 2020-03-25 DIAGNOSIS — H35373 Puckering of macula, bilateral: Secondary | ICD-10-CM | POA: Diagnosis not present

## 2020-03-28 DIAGNOSIS — J309 Allergic rhinitis, unspecified: Secondary | ICD-10-CM | POA: Diagnosis not present

## 2020-03-28 DIAGNOSIS — J019 Acute sinusitis, unspecified: Secondary | ICD-10-CM | POA: Diagnosis not present

## 2020-03-30 NOTE — Progress Notes (Signed)
Cardiology Office Note:    Date:  04/01/2020   ID:  April Berg, DOB 04-29-1946, MRN [id number]  PCP:  Marton Redwood, MD  Cardiologist:  No primary care provider on file.  Electrophysiologist:  None   Referring MD: Marton Redwood, MD   Chief Complaint  Patient presents with  . Chest Pain    History of Present Illness:    April Berg is a 74 y.o. female with a hx of hypertension, hypothyroidism, allergy-induced asthma who presents for follow-up.  She was initially seen on 12/27/2019 for chest pain evaluation.  Reported atypical chest pain.  Coronary CTA was done on 02/07/2020, which showed no evidence of CAD (calcium score 0).  Labs prior to CTA showed hyperkalemia, it was recommended that she switch from telmisartan to amlodipine for her hypertension.  Since last clinic visit, she reports she has been doing well.  She denies any chest pain, dyspnea, lightheadedness, syncope, palpitations, lower extremity edema.  Reports BP has been well controlled, 110s to 120s over 60s to 70s.   Past Medical History:  Diagnosis Date  . Anxiety   . Arthritis    osteoarthritis  . Cataract   . Complication of anesthesia 90's   block for elbow surgery and pt had a seizure... surgery since with no problem   . Depression   . GERD (gastroesophageal reflux disease)   . Glaucoma of both eyes   . H/O hiatal hernia   . History of pericarditis    DEC 2008 AND SMALL PERICARDIAL EFFUSION--   RESOLVED  . Hyperlipidemia   . Hypertension   . Hypothyroidism   . IBS (irritable bowel syndrome)   . Interstitial cystitis   . Migraines   . Pelvic pain   . Seizures ([place])    one occurence in the 1990's after anesthesia, no problems since  . Spinal stenosis   . Vitamin D deficiency     Past Surgical History:  Procedure Laterality Date  . ANTERIOR CERVICAL DECOMP/DISCECTOMY FUSION  12/08/2011   Procedure: ANTERIOR CERVICAL DECOMPRESSION/DISCECTOMY FUSION 3 LEVELS;  Surgeon: Sinclair Ship, MD;   Location: [facility];  Service: Orthopedics;  Laterality: N/A;  C 3-6 ACDF  . BREAST BIOPSY Right   . CARDIAC CATHETERIZATION  10-27-2007  DR Eustace Quail   NORMAL CORONARY ARTERIES/ NORMAL LVF  . CARDIOVASCULAR STRESS TEST  02-25-2010  DR CRENSHAW   ANTERIOR ATTENUATION NO SCAR OR ISCHEMIA/ EF 81%  . CATARACT EXTRACTION W/ INTRAOCULAR LENS  IMPLANT, BILATERAL    . CESAREAN SECTION  1975  . COLONOSCOPY    . CYSTO WITH HYDRODISTENSION N/A 06/14/2013   Procedure: CYSTOSCOPY/HYDRODISTENSION/INSTILLATION OF MARCAINE AND PYRIDIUM;  Surgeon: Reece Packer, MD;  Location: [facility];  Service: Urology;  Laterality: N/A;  . ELBOW TENDON RELEASE Left 1996  . GLAUCOMA SURGERY Bilateral 2004  . RHINOPLASTY  2010  . THUMB TENDON TRANSPOSITION Right 2012  . TONSILLECTOMY  1951  . TOTAL ABDOMINAL HYSTERECTOMY W/ BILATERAL SALPINGOOPHORECTOMY  1982  . TRANSTHORACIC ECHOCARDIOGRAM  03-04-2010   MODERATE LVH/ NORMAL LVSF/ MILD DIASTOLIC DYSFUNCTION/ EF [id number] MILD AI  . UPPER GASTROINTESTINAL ENDOSCOPY      Current Medications: Current Meds  Medication Sig  . amLODipine (NORVASC) 5 MG tablet Take 1 tablet (5 mg total) by mouth daily.  Marland Kitchen amoxicillin-clavulanate (AUGMENTIN) 875-125 MG tablet Take 1 tablet by mouth 2 (two) times daily.  . cholecalciferol (VITAMIN D) 1000 units tablet Take 2,000 Units by mouth daily.  . cyclobenzaprine (FLEXERIL)  10 MG tablet Flexeril 10 mg tablet  Take 1 tablet every 8 hours by oral route as needed for spasm for 10 days.  Marland Kitchen dexlansoprazole (DEXILANT) 60 MG capsule Take 60 mg by mouth daily.  Marland Kitchen escitalopram (LEXAPRO) 10 MG tablet Take 10 mg by mouth daily.   . famciclovir (FAMVIR) 500 MG tablet Take by mouth 3 (three) times daily as needed (outbreak).   . fexofenadine (ALLEGRA) 180 MG tablet Take 180 mg by mouth daily.  . fluticasone (FLONASE) 50 MCG/ACT nasal spray Place 1 spray into both nostrils daily as needed for allergies.   Marland Kitchen gabapentin (NEURONTIN)  300 MG capsule Take 300 mg by mouth 3 (three) times daily.  Marland Kitchen guaiFENesin (MUCINEX) 600 MG 12 hr tablet Take 600 mg by mouth 2 (two) times daily as needed for cough.  . levothyroxine (SYNTHROID, LEVOTHROID) 125 MCG tablet Take 125 mcg by mouth daily before breakfast.  . LORazepam (ATIVAN) 1 MG tablet Take 1 mg by mouth daily as needed.  . meclizine (ANTIVERT) 50 MG tablet Take 1 tablet (50 mg total) by mouth 3 (three) times daily as needed.  . Multiple Vitamins-Minerals (MULTIVITAMINS THER. W/MINERALS) TABS Take 1 tablet by mouth daily.  Marland Kitchen oxyCODONE (OXY IR/ROXICODONE) 5 MG immediate release tablet Take 10-325 mg by mouth daily as needed for moderate pain or severe pain.   . polyethylene glycol (MIRALAX) packet Use three times daily until bowels move - Max 3 consecutive days  . SYMBICORT 80-4.5 MCG/ACT inhaler Inhale 1 puff into the lungs daily.  . timolol (TIMOPTIC) 0.5 % ophthalmic solution Place 1 drop into both eyes daily.   Marland Kitchen zolpidem (AMBIEN) 10 MG tablet Take 10 mg by mouth at bedtime as needed.   Current Facility-Administered Medications for the 04/01/20 encounter (Office Visit) with Donato Heinz, MD  Medication  . 0.9 %  sodium chloride infusion     Allergies:   Moxifloxacin hcl in nacl, Nsaids, Sulfa antibiotics, Aspirin, Avelox [moxifloxacin], Chlorthalidone, Hydrochlorothiazide, and Neomycin   Social History   Socioeconomic History  . Marital status: Married    Spouse name: Not on file  . Number of children: 1  . Years of education: Not on file  . Highest education level: Not on file  Occupational History  . Occupation: Retired  Tobacco Use  . Smoking status: Never Smoker  . Smokeless tobacco: Never Used  Substance and Sexual Activity  . Alcohol use: Yes    Alcohol/week: 2.0 standard drinks    Types: 2 Glasses of wine per week  . Drug use: No  . Sexual activity: Not on file  Other Topics Concern  . Not on file  Social History Narrative  . Not on file    Social Determinants of Health   Financial Resource Strain:   . Difficulty of Paying Living Expenses:   Food Insecurity:   . Worried About Charity fundraiser in the Last Year:   . Arboriculturist in the Last Year:   Transportation Needs:   . Film/video editor (Medical):   Marland Kitchen Lack of Transportation (Non-Medical):   Physical Activity:   . Days of Exercise per Week:   . Minutes of Exercise per Session:   Stress:   . Feeling of Stress :   Social Connections:   . Frequency of Communication with Friends and Family:   . Frequency of Social Gatherings with Friends and Family:   . Attends Religious Services:   . Active Member of Clubs or Organizations:   .  Attends Archivist Meetings:   Marland Kitchen Marital Status:      Family History: The patient's family history includes Lung disease in her father; Pulmonary fibrosis in her mother. There is no history of Colon cancer, Esophageal cancer, Rectal cancer, or Stomach cancer.  ROS:   Please see the history of present illness.    All other systems reviewed and are negative.  EKGs/Labs/Other Studies Reviewed:    The following studies were reviewed today:   EKG:  EKG is not ordered today.  The ekg ordered most recently demonstrates normal sinus rhythm, rate 68, T wave inversions in leads V3-5  Coronary CTA 02/07/20: 1. No evidence of CAD, CADRADS = 0.  2. Coronary calcium score of 0. This was 0 percentile for age and sex matched control.  3. Normal coronary origin with right dominance.  IMPRESSION: No acute extra cardiac abnormality.   Recent Labs: 08/26/2019: ALT 16; Hemoglobin 14.5; Platelets 231 02/11/2020: BUN 31; Creatinine, Ser 0.91; Potassium 5.6; Sodium 136  Recent Lipid Panel No results found for: CHOL, TRIG, HDL, CHOLHDL, VLDL, LDLCALC, LDLDIRECT  Physical Exam:    VS:  BP 120/68   Pulse 71   Ht 5\' 3"  (1.6 m)   Wt 142 lb 6.4 oz (64.6 kg)   SpO2 97%   BMI 25.23 kg/m     Wt Readings from Last 3  Encounters:  04/01/20 142 lb 6.4 oz (64.6 kg)  01/31/20 142 lb (64.4 kg)  12/27/19 147 lb (66.7 kg)     GEN: Well nourished, well developed in no acute distress HEENT: Normal NECK: No JVD CARDIAC: RRR, no murmurs, rubs, gallops RESPIRATORY:  Clear to auscultation without rales, wheezing or rhonchi  ABDOMEN: Soft, non-tender, non-distended MUSCULOSKELETAL:  No edema; No deformity  SKIN: Warm and dry NEUROLOGIC:  Alert and oriented x 3 PSYCHIATRIC:  Normal affect   ASSESSMENT:    1. Essential hypertension   2. Medication management   3. Chest pain, unspecified type   4. Hyperlipidemia, unspecified hyperlipidemia type   5. Hyperkalemia    PLAN:     Chest pain: Atypical in description.  Coronary CTA on 02/07/2020 showed no CAD (calcium score 0).  Reports no recent episodes of chest pain.  Hypertension: Had been on telmisartan 40 mg daily, but developed hyperkalemia.  Switched to amlodipine 5 mg daily.  Appears well controlled.  Will recheck BMP to ensure hyperkalemia resolved with discontinuing telmisartan  Hyperlipidemia: LDL 187 on 01/01/2019.  Has been unable to tolerate any statins.  Calcium score 0 as above.  She has been working on diet/exercise and repeat lipid panel in 01/08/2020 showed LDL improved to 121  RTC in 1 year  Medication Adjustments/Labs and Tests Ordered: Current medicines are reviewed at length with the patient today.  Concerns regarding medicines are outlined above.  Orders Placed This Encounter  Procedures  . Basic metabolic panel   No orders of the defined types were placed in this encounter.   Patient Instructions  Medication Instructions:  Your physician recommends that you continue on your current medications as directed. Please refer to the Current Medication list given to you today.  *If you need a refill on your cardiac medications before your next appointment, please call your pharmacy*   Lab Work: BMET today  If you have labs (blood  work) drawn today and your tests are completely normal, you will receive your results only by: Marland Kitchen MyChart Message (if you have MyChart) OR . A paper copy in the mail  If you have any lab test that is abnormal or we need to change your treatment, we will call you to review the results.  Follow-Up: At [facility], you and your health needs are our priority.  As part of our continuing mission to provide you with exceptional heart care, we have created designated Provider Care Teams.  These Care Teams include your primary Cardiologist (physician) and Advanced Practice Providers (APPs -  Physician Assistants and Nurse Practitioners) who all work together to provide you with the care you need, when you need it.  We recommend signing up for the patient portal called "MyChart".  Sign up information is provided on this After Visit Summary.  MyChart is used to connect with patients for Virtual Visits (Telemedicine).  Patients are able to view lab/test results, encounter notes, upcoming appointments, etc.  Non-urgent messages can be sent to your provider as well.   To learn more about what you can do with MyChart, go to [email or web address].    Your next appointment:   12 month(s)  The format for your next appointment:   In Person  Provider:   Oswaldo Milian, MD         Signed, Donato Heinz, MD  04/01/2020 10:45 PM    [place]

## 2020-04-01 ENCOUNTER — Other Ambulatory Visit: Payer: Self-pay

## 2020-04-01 ENCOUNTER — Ambulatory Visit (INDEPENDENT_AMBULATORY_CARE_PROVIDER_SITE_OTHER): Payer: Medicare Other | Admitting: Cardiology

## 2020-04-01 ENCOUNTER — Encounter: Payer: Self-pay | Admitting: Cardiology

## 2020-04-01 VITALS — BP 120/68 | HR 71 | Ht 63.0 in | Wt 142.4 lb

## 2020-04-01 DIAGNOSIS — Z79899 Other long term (current) drug therapy: Secondary | ICD-10-CM | POA: Diagnosis not present

## 2020-04-01 DIAGNOSIS — R079 Chest pain, unspecified: Secondary | ICD-10-CM | POA: Diagnosis not present

## 2020-04-01 DIAGNOSIS — I10 Essential (primary) hypertension: Secondary | ICD-10-CM | POA: Diagnosis not present

## 2020-04-01 DIAGNOSIS — E875 Hyperkalemia: Secondary | ICD-10-CM

## 2020-04-01 DIAGNOSIS — E785 Hyperlipidemia, unspecified: Secondary | ICD-10-CM

## 2020-04-01 NOTE — Patient Instructions (Signed)
Medication Instructions:  Your physician recommends that you continue on your current medications as directed. Please refer to the Current Medication list given to you today.  *If you need a refill on your cardiac medications before your next appointment, please call your pharmacy*   Lab Work: BMET today  If you have labs (blood work) drawn today and your tests are completely normal, you will receive your results only by: Marland Kitchen MyChart Message (if you have MyChart) OR . A paper copy in the mail If you have any lab test that is abnormal or we need to change your treatment, we will call you to review the results.  Follow-Up: At [facility], you and your health needs are our priority.  As part of our continuing mission to provide you with exceptional heart care, we have created designated Provider Care Teams.  These Care Teams include your primary Cardiologist (physician) and Advanced Practice Providers (APPs -  Physician Assistants and Nurse Practitioners) who all work together to provide you with the care you need, when you need it.  We recommend signing up for the patient portal called "MyChart".  Sign up information is provided on this After Visit Summary.  MyChart is used to connect with patients for Virtual Visits (Telemedicine).  Patients are able to view lab/test results, encounter notes, upcoming appointments, etc.  Non-urgent messages can be sent to your provider as well.   To learn more about what you can do with MyChart, go to [email or web address].    Your next appointment:   12 month(s)  The format for your next appointment:   In Person  Provider:   Oswaldo Milian, MD

## 2020-04-02 ENCOUNTER — Ambulatory Visit (INDEPENDENT_AMBULATORY_CARE_PROVIDER_SITE_OTHER): Payer: Medicare Other | Admitting: Podiatrist

## 2020-04-02 ENCOUNTER — Encounter: Payer: Self-pay | Admitting: Podiatrist

## 2020-04-02 DIAGNOSIS — L600 Ingrowing nail: Secondary | ICD-10-CM

## 2020-04-02 LAB — BASIC METABOLIC PANEL
BUN/Creatinine Ratio: 20 (ref 12–28)
BUN: 18 mg/dL (ref 8–27)
CO2: 26 mmol/L (ref 20–29)
Calcium: 9.2 mg/dL (ref 8.7–10.3)
Chloride: 102 mmol/L (ref 96–106)
Creatinine, Ser: 0.91 mg/dL (ref 0.57–1.00)
GFR calc Af Amer: 73 mL/min/{1.73_m2} (ref >59)
GFR calc non Af Amer: 63 mL/min/{1.73_m2} (ref >59)
Glucose: 95 mg/dL (ref 65–99)
Potassium: 4.7 mmol/L (ref 3.5–5.2)
Sodium: 140 mmol/L (ref 134–144)

## 2020-04-02 NOTE — Patient Instructions (Signed)
Soak Instructions    THE DAY AFTER THE PROCEDURE  Place 1/4 cup of epsom salts in a quart of warm tap water.  Submerge your foot or feet with outer bandage intact for the initial soak; this will allow the bandage to become moist and wet for easy lift off.  Once you remove your bandage, continue to soak in the solution for 20 minutes.  This soak should be done twice a day.  Next, remove your foot or feet from solution, blot dry the affected area and cover.  You may use a band aid large enough to cover the area or use gauze and tape.  Apply other medications to the area as directed by the doctor such as polysporin neosporin.  IF YOUR SKIN BECOMES IRRITATED WHILE USING THESE INSTRUCTIONS, IT IS OKAY TO SWITCH TO  antibacterial soap pump soap (Dial)  and water to keep the toe clean

## 2020-04-02 NOTE — Progress Notes (Signed)
  Chief Complaint  Patient presents with  . Nail Problem    Left 1st lateral ingrown. Pt states 6 month duration, denies drainage. Pt states painful.     HPI: Patient is 74 y.o. female who presents today for the concerns as listed above. Mrs. Blackley states that the lateral aspect of the left great toenail has been tender when pressed and she has been soaking it.  States it has improved but still painful.  She is getting ready to go on a family trip to the beach and would like conservative treatments if possible.   Review of Systems No fevers, chills, nausea, muscle aches, no difficulty breathing, no calf pain, no chest pain or shortness of breath.   Physical Exam  GENERAL APPEARANCE: Alert, conversant. Appropriately groomed. No acute distress.   VASCULAR: Pedal pulses palpable DP and PT bilateral.  Capillary refill time is immediate to all digits,  Proximal to distal cooling it warm to warm.  Digital hair growth is present bilateral   NEUROLOGIC: sensation is intact epicritically and protectively to 5.07 monofilament at 5/5 sites bilateral.  Light touch is intact bilateral, vibratory sensation intact bilateral, achilles tendon reflex is intact bilateral.   MUSCULOSKELETAL: acceptable muscle strength, tone and stability bilateral.  No gross boney pedal deformities noted.  No pain, crepitus or limitation noted with foot and ankle range of motion bilateral.   DERMATOLOGIC: skin is warm, supple, and dry.  Left hallux nail lateral border is painful with pressure.  No redness, no swelling, no sign of infection noted.  No drainage noted or reported.  Distal tip of the nail itself is not ingrown lateral nail border itself is.   Assessment   Ingrowing toenail left first lateral border  Plan  Discussed treatment options and alternatives.  Recommended continued soaks along with pulling the lateral nail border away from the nail with a Band-Aid or tape.  Discussed that she would need to have the  nail border removed likely in a permanent fashion and she will wait until after her trip to have this performed.  She is instructed to call if any redness, swelling, drainage or increased pain arise.

## 2020-04-29 ENCOUNTER — Ambulatory Visit: Payer: Medicare Other | Admitting: Podiatry

## 2020-04-29 DIAGNOSIS — M79652 Pain in left thigh: Secondary | ICD-10-CM | POA: Diagnosis not present

## 2020-04-29 DIAGNOSIS — M5416 Radiculopathy, lumbar region: Secondary | ICD-10-CM | POA: Diagnosis not present

## 2020-04-29 DIAGNOSIS — M545 Low back pain: Secondary | ICD-10-CM | POA: Diagnosis not present

## 2020-04-29 DIAGNOSIS — Z981 Arthrodesis status: Secondary | ICD-10-CM | POA: Diagnosis not present

## 2020-05-01 DIAGNOSIS — Z961 Presence of intraocular lens: Secondary | ICD-10-CM | POA: Diagnosis not present

## 2020-05-01 DIAGNOSIS — H401131 Primary open-angle glaucoma, bilateral, mild stage: Secondary | ICD-10-CM | POA: Diagnosis not present

## 2020-05-01 DIAGNOSIS — H35372 Puckering of macula, left eye: Secondary | ICD-10-CM | POA: Diagnosis not present

## 2020-05-01 DIAGNOSIS — D3132 Benign neoplasm of left choroid: Secondary | ICD-10-CM | POA: Diagnosis not present

## 2020-05-01 DIAGNOSIS — H353132 Nonexudative age-related macular degeneration, bilateral, intermediate dry stage: Secondary | ICD-10-CM | POA: Diagnosis not present

## 2020-05-02 DIAGNOSIS — M5416 Radiculopathy, lumbar region: Secondary | ICD-10-CM | POA: Diagnosis not present

## 2020-05-02 DIAGNOSIS — G894 Chronic pain syndrome: Secondary | ICD-10-CM | POA: Diagnosis not present

## 2020-05-02 DIAGNOSIS — M47812 Spondylosis without myelopathy or radiculopathy, cervical region: Secondary | ICD-10-CM | POA: Diagnosis not present

## 2020-05-02 DIAGNOSIS — M961 Postlaminectomy syndrome, not elsewhere classified: Secondary | ICD-10-CM | POA: Diagnosis not present

## 2020-05-06 ENCOUNTER — Ambulatory Visit: Payer: Medicare Other | Admitting: Podiatry

## 2020-06-26 DIAGNOSIS — C44729 Squamous cell carcinoma of skin of left lower limb, including hip: Secondary | ICD-10-CM | POA: Diagnosis not present

## 2020-06-26 DIAGNOSIS — L570 Actinic keratosis: Secondary | ICD-10-CM | POA: Diagnosis not present

## 2020-06-26 DIAGNOSIS — D485 Neoplasm of uncertain behavior of skin: Secondary | ICD-10-CM | POA: Diagnosis not present

## 2020-06-26 DIAGNOSIS — L821 Other seborrheic keratosis: Secondary | ICD-10-CM | POA: Diagnosis not present

## 2020-06-26 DIAGNOSIS — D1801 Hemangioma of skin and subcutaneous tissue: Secondary | ICD-10-CM | POA: Diagnosis not present

## 2020-07-08 DIAGNOSIS — D0472 Carcinoma in situ of skin of left lower limb, including hip: Secondary | ICD-10-CM | POA: Diagnosis not present

## 2020-07-17 DIAGNOSIS — R140 Abdominal distension (gaseous): Secondary | ICD-10-CM | POA: Diagnosis not present

## 2020-07-17 DIAGNOSIS — R1013 Epigastric pain: Secondary | ICD-10-CM | POA: Diagnosis not present

## 2020-07-29 DIAGNOSIS — M5416 Radiculopathy, lumbar region: Secondary | ICD-10-CM | POA: Diagnosis not present

## 2020-07-29 DIAGNOSIS — G894 Chronic pain syndrome: Secondary | ICD-10-CM | POA: Diagnosis not present

## 2020-07-29 DIAGNOSIS — M47812 Spondylosis without myelopathy or radiculopathy, cervical region: Secondary | ICD-10-CM | POA: Diagnosis not present

## 2020-07-29 DIAGNOSIS — Z79891 Long term (current) use of opiate analgesic: Secondary | ICD-10-CM | POA: Diagnosis not present

## 2020-07-29 DIAGNOSIS — M961 Postlaminectomy syndrome, not elsewhere classified: Secondary | ICD-10-CM | POA: Diagnosis not present

## 2020-07-30 DIAGNOSIS — D3132 Benign neoplasm of left choroid: Secondary | ICD-10-CM | POA: Diagnosis not present

## 2020-07-30 DIAGNOSIS — H43393 Other vitreous opacities, bilateral: Secondary | ICD-10-CM | POA: Diagnosis not present

## 2020-07-30 DIAGNOSIS — H35373 Puckering of macula, bilateral: Secondary | ICD-10-CM | POA: Diagnosis not present

## 2020-07-30 DIAGNOSIS — Z961 Presence of intraocular lens: Secondary | ICD-10-CM | POA: Diagnosis not present

## 2020-07-31 DIAGNOSIS — H9312 Tinnitus, left ear: Secondary | ICD-10-CM | POA: Diagnosis not present

## 2020-07-31 DIAGNOSIS — H903 Sensorineural hearing loss, bilateral: Secondary | ICD-10-CM | POA: Diagnosis not present

## 2020-07-31 DIAGNOSIS — Z822 Family history of deafness and hearing loss: Secondary | ICD-10-CM | POA: Diagnosis not present

## 2020-08-07 DIAGNOSIS — Z23 Encounter for immunization: Secondary | ICD-10-CM | POA: Diagnosis not present

## 2020-08-18 DIAGNOSIS — Z23 Encounter for immunization: Secondary | ICD-10-CM | POA: Diagnosis not present

## 2020-08-20 DIAGNOSIS — R300 Dysuria: Secondary | ICD-10-CM | POA: Diagnosis not present

## 2020-08-20 DIAGNOSIS — E039 Hypothyroidism, unspecified: Secondary | ICD-10-CM | POA: Diagnosis not present

## 2020-08-21 DIAGNOSIS — M25512 Pain in left shoulder: Secondary | ICD-10-CM | POA: Diagnosis not present

## 2020-08-21 DIAGNOSIS — M542 Cervicalgia: Secondary | ICD-10-CM | POA: Diagnosis not present

## 2020-08-26 DIAGNOSIS — M5416 Radiculopathy, lumbar region: Secondary | ICD-10-CM | POA: Diagnosis not present

## 2020-08-26 DIAGNOSIS — Z981 Arthrodesis status: Secondary | ICD-10-CM | POA: Diagnosis not present

## 2020-09-20 DIAGNOSIS — Z01812 Encounter for preprocedural laboratory examination: Secondary | ICD-10-CM | POA: Diagnosis not present

## 2020-09-20 DIAGNOSIS — Z20822 Contact with and (suspected) exposure to covid-19: Secondary | ICD-10-CM | POA: Diagnosis not present

## 2020-09-22 DIAGNOSIS — Z79899 Other long term (current) drug therapy: Secondary | ICD-10-CM | POA: Diagnosis not present

## 2020-09-22 DIAGNOSIS — F419 Anxiety disorder, unspecified: Secondary | ICD-10-CM | POA: Diagnosis not present

## 2020-09-22 DIAGNOSIS — Z79890 Hormone replacement therapy (postmenopausal): Secondary | ICD-10-CM | POA: Diagnosis not present

## 2020-09-22 DIAGNOSIS — I10 Essential (primary) hypertension: Secondary | ICD-10-CM | POA: Diagnosis not present

## 2020-09-22 DIAGNOSIS — Z886 Allergy status to analgesic agent status: Secondary | ICD-10-CM | POA: Diagnosis not present

## 2020-09-22 DIAGNOSIS — Z961 Presence of intraocular lens: Secondary | ICD-10-CM | POA: Diagnosis not present

## 2020-09-22 DIAGNOSIS — K219 Gastro-esophageal reflux disease without esophagitis: Secondary | ICD-10-CM | POA: Diagnosis not present

## 2020-09-22 DIAGNOSIS — K589 Irritable bowel syndrome without diarrhea: Secondary | ICD-10-CM | POA: Diagnosis not present

## 2020-09-22 DIAGNOSIS — E785 Hyperlipidemia, unspecified: Secondary | ICD-10-CM | POA: Diagnosis not present

## 2020-09-22 DIAGNOSIS — E039 Hypothyroidism, unspecified: Secondary | ICD-10-CM | POA: Diagnosis not present

## 2020-09-22 DIAGNOSIS — M1990 Unspecified osteoarthritis, unspecified site: Secondary | ICD-10-CM | POA: Diagnosis not present

## 2020-09-22 DIAGNOSIS — Z9842 Cataract extraction status, left eye: Secondary | ICD-10-CM | POA: Diagnosis not present

## 2020-09-22 DIAGNOSIS — H35372 Puckering of macula, left eye: Secondary | ICD-10-CM | POA: Diagnosis not present

## 2020-09-22 DIAGNOSIS — Z83511 Family history of glaucoma: Secondary | ICD-10-CM | POA: Diagnosis not present

## 2020-09-22 DIAGNOSIS — F329 Major depressive disorder, single episode, unspecified: Secondary | ICD-10-CM | POA: Diagnosis not present

## 2020-09-22 DIAGNOSIS — Z981 Arthrodesis status: Secondary | ICD-10-CM | POA: Diagnosis not present

## 2020-09-22 DIAGNOSIS — Z8249 Family history of ischemic heart disease and other diseases of the circulatory system: Secondary | ICD-10-CM | POA: Diagnosis not present

## 2020-09-22 DIAGNOSIS — Z7951 Long term (current) use of inhaled steroids: Secondary | ICD-10-CM | POA: Diagnosis not present

## 2020-09-23 DIAGNOSIS — H35372 Puckering of macula, left eye: Secondary | ICD-10-CM | POA: Diagnosis not present

## 2020-10-01 DIAGNOSIS — H35372 Puckering of macula, left eye: Secondary | ICD-10-CM | POA: Diagnosis not present

## 2020-10-27 DIAGNOSIS — M961 Postlaminectomy syndrome, not elsewhere classified: Secondary | ICD-10-CM | POA: Diagnosis not present

## 2020-10-27 DIAGNOSIS — G894 Chronic pain syndrome: Secondary | ICD-10-CM | POA: Diagnosis not present

## 2020-10-27 DIAGNOSIS — M47812 Spondylosis without myelopathy or radiculopathy, cervical region: Secondary | ICD-10-CM | POA: Diagnosis not present

## 2020-10-27 DIAGNOSIS — M5416 Radiculopathy, lumbar region: Secondary | ICD-10-CM | POA: Diagnosis not present

## 2020-11-28 DIAGNOSIS — M5459 Other low back pain: Secondary | ICD-10-CM | POA: Diagnosis not present

## 2020-11-28 DIAGNOSIS — M25552 Pain in left hip: Secondary | ICD-10-CM | POA: Diagnosis not present

## 2020-12-03 DIAGNOSIS — H35372 Puckering of macula, left eye: Secondary | ICD-10-CM | POA: Diagnosis not present

## 2020-12-09 DIAGNOSIS — I10 Essential (primary) hypertension: Secondary | ICD-10-CM | POA: Diagnosis not present

## 2020-12-09 DIAGNOSIS — M47816 Spondylosis without myelopathy or radiculopathy, lumbar region: Secondary | ICD-10-CM | POA: Diagnosis not present

## 2020-12-09 DIAGNOSIS — M461 Sacroiliitis, not elsewhere classified: Secondary | ICD-10-CM | POA: Diagnosis not present

## 2020-12-09 DIAGNOSIS — Z6827 Body mass index (BMI) 27.0-27.9, adult: Secondary | ICD-10-CM | POA: Diagnosis not present

## 2020-12-16 DIAGNOSIS — H02055 Trichiasis without entropian left lower eyelid: Secondary | ICD-10-CM | POA: Diagnosis not present

## 2020-12-22 DIAGNOSIS — M461 Sacroiliitis, not elsewhere classified: Secondary | ICD-10-CM | POA: Diagnosis not present

## 2021-01-08 ENCOUNTER — Ambulatory Visit: Payer: Medicare Other | Admitting: Internal Medicine

## 2021-01-13 DIAGNOSIS — M47816 Spondylosis without myelopathy or radiculopathy, lumbar region: Secondary | ICD-10-CM | POA: Diagnosis not present

## 2021-01-13 DIAGNOSIS — M461 Sacroiliitis, not elsewhere classified: Secondary | ICD-10-CM | POA: Diagnosis not present

## 2021-01-14 ENCOUNTER — Other Ambulatory Visit: Payer: Self-pay | Admitting: Internal Medicine

## 2021-01-14 DIAGNOSIS — Z1231 Encounter for screening mammogram for malignant neoplasm of breast: Secondary | ICD-10-CM

## 2021-01-19 DIAGNOSIS — E785 Hyperlipidemia, unspecified: Secondary | ICD-10-CM | POA: Diagnosis not present

## 2021-01-19 DIAGNOSIS — M8589 Other specified disorders of bone density and structure, multiple sites: Secondary | ICD-10-CM | POA: Diagnosis not present

## 2021-01-19 DIAGNOSIS — E039 Hypothyroidism, unspecified: Secondary | ICD-10-CM | POA: Diagnosis not present

## 2021-01-19 DIAGNOSIS — M810 Age-related osteoporosis without current pathological fracture: Secondary | ICD-10-CM | POA: Diagnosis not present

## 2021-01-19 DIAGNOSIS — M542 Cervicalgia: Secondary | ICD-10-CM | POA: Diagnosis not present

## 2021-01-19 DIAGNOSIS — N3010 Interstitial cystitis (chronic) without hematuria: Secondary | ICD-10-CM | POA: Diagnosis not present

## 2021-01-19 DIAGNOSIS — R102 Pelvic and perineal pain: Secondary | ICD-10-CM | POA: Diagnosis not present

## 2021-01-23 DIAGNOSIS — Z1212 Encounter for screening for malignant neoplasm of rectum: Secondary | ICD-10-CM | POA: Diagnosis not present

## 2021-01-26 DIAGNOSIS — I10 Essential (primary) hypertension: Secondary | ICD-10-CM | POA: Diagnosis not present

## 2021-01-26 DIAGNOSIS — J45909 Unspecified asthma, uncomplicated: Secondary | ICD-10-CM | POA: Diagnosis not present

## 2021-01-26 DIAGNOSIS — F3342 Major depressive disorder, recurrent, in full remission: Secondary | ICD-10-CM | POA: Diagnosis not present

## 2021-01-26 DIAGNOSIS — H8110 Benign paroxysmal vertigo, unspecified ear: Secondary | ICD-10-CM | POA: Diagnosis not present

## 2021-01-26 DIAGNOSIS — I700 Atherosclerosis of aorta: Secondary | ICD-10-CM | POA: Diagnosis not present

## 2021-01-26 DIAGNOSIS — M791 Myalgia, unspecified site: Secondary | ICD-10-CM | POA: Diagnosis not present

## 2021-01-26 DIAGNOSIS — Z1331 Encounter for screening for depression: Secondary | ICD-10-CM | POA: Diagnosis not present

## 2021-01-26 DIAGNOSIS — Z0000 Encounter for general adult medical examination without abnormal findings: Secondary | ICD-10-CM | POA: Diagnosis not present

## 2021-01-26 DIAGNOSIS — M4800 Spinal stenosis, site unspecified: Secondary | ICD-10-CM | POA: Diagnosis not present

## 2021-01-26 DIAGNOSIS — M810 Age-related osteoporosis without current pathological fracture: Secondary | ICD-10-CM | POA: Diagnosis not present

## 2021-01-26 DIAGNOSIS — E039 Hypothyroidism, unspecified: Secondary | ICD-10-CM | POA: Diagnosis not present

## 2021-01-26 DIAGNOSIS — Z1339 Encounter for screening examination for other mental health and behavioral disorders: Secondary | ICD-10-CM | POA: Diagnosis not present

## 2021-01-26 DIAGNOSIS — N3010 Interstitial cystitis (chronic) without hematuria: Secondary | ICD-10-CM | POA: Diagnosis not present

## 2021-01-26 DIAGNOSIS — E785 Hyperlipidemia, unspecified: Secondary | ICD-10-CM | POA: Diagnosis not present

## 2021-02-03 DIAGNOSIS — G894 Chronic pain syndrome: Secondary | ICD-10-CM | POA: Diagnosis not present

## 2021-02-03 DIAGNOSIS — M47812 Spondylosis without myelopathy or radiculopathy, cervical region: Secondary | ICD-10-CM | POA: Diagnosis not present

## 2021-02-03 DIAGNOSIS — M961 Postlaminectomy syndrome, not elsewhere classified: Secondary | ICD-10-CM | POA: Diagnosis not present

## 2021-02-03 DIAGNOSIS — M5416 Radiculopathy, lumbar region: Secondary | ICD-10-CM | POA: Diagnosis not present

## 2021-02-11 ENCOUNTER — Other Ambulatory Visit (HOSPITAL_COMMUNITY): Payer: Self-pay | Admitting: *Deleted

## 2021-02-12 ENCOUNTER — Telehealth: Payer: Self-pay | Admitting: Cardiology

## 2021-02-12 ENCOUNTER — Other Ambulatory Visit: Payer: Self-pay

## 2021-02-12 ENCOUNTER — Ambulatory Visit (HOSPITAL_COMMUNITY)
Admission: RE | Admit: 2021-02-12 | Discharge: 2021-02-12 | Disposition: A | Discharge status: Home / Self Care | Payer: Medicare Other | Source: Ambulatory Visit | Attending: Internal Medicine | Admitting: Internal Medicine

## 2021-02-12 DIAGNOSIS — M810 Age-related osteoporosis without current pathological fracture: Secondary | ICD-10-CM | POA: Diagnosis not present

## 2021-02-12 MED ORDER — ZOLEDRONIC ACID 5 MG/100ML IV SOLN: 5.0000 mg | Freq: Once | INTRAVENOUS | Status: AC

## 2021-02-12 MED ORDER — ZOLEDRONIC ACID 5 MG/100ML IV SOLN
INTRAVENOUS | Status: AC
  Administered 2021-02-12: 5 mg via INTRAVENOUS
  Filled 2021-02-12: qty 100

## 2021-02-12 NOTE — Telephone Encounter (Signed)
Pt c/o BP issue: STAT if pt c/o blurred vision, one-sided weakness or slurred speech  1. What are your last 5 BP readings? 150-160's/80's-90's  2. Are you having any other symptoms (ex. Dizziness, headache, blurred vision, passed out)? Lightheaded and headache  3. What is your BP issue? Patient states that over the last couple of months her BP has been elevated. Could not give me exact readings but was able to give the average of what it is running on average. Scheduled her for tomorrow at 4:00pm with Dr. Gardiner Rhyme to discuss her concerns.

## 2021-02-12 NOTE — Telephone Encounter (Signed)
Spoke to patient she stated she already scheduled appointment with Dr.Schumann 3/25 at 4:00 pm.Advised to bring B/P readings to appointment.

## 2021-02-13 ENCOUNTER — Encounter: Payer: Self-pay | Admitting: Radiology

## 2021-02-13 ENCOUNTER — Ambulatory Visit (INDEPENDENT_AMBULATORY_CARE_PROVIDER_SITE_OTHER): Payer: Medicare Other | Admitting: Cardiology

## 2021-02-13 ENCOUNTER — Other Ambulatory Visit: Payer: Self-pay

## 2021-02-13 ENCOUNTER — Ambulatory Visit (INDEPENDENT_AMBULATORY_CARE_PROVIDER_SITE_OTHER): Payer: Medicare Other

## 2021-02-13 VITALS — BP 160/79 | HR 71 | Ht 63.0 in | Wt 152.8 lb

## 2021-02-13 DIAGNOSIS — I10 Essential (primary) hypertension: Secondary | ICD-10-CM

## 2021-02-13 DIAGNOSIS — R079 Chest pain, unspecified: Secondary | ICD-10-CM

## 2021-02-13 DIAGNOSIS — E785 Hyperlipidemia, unspecified: Secondary | ICD-10-CM | POA: Diagnosis not present

## 2021-02-13 DIAGNOSIS — R002 Palpitations: Secondary | ICD-10-CM | POA: Diagnosis not present

## 2021-02-13 MED ORDER — AMLODIPINE BESYLATE 10 MG PO TABS
10.0000 mg | ORAL_TABLET | Freq: Every day | ORAL | 3 refills | Status: DC
Start: 2021-02-13 — End: 2021-03-04

## 2021-02-13 NOTE — Patient Instructions (Signed)
Medication Instructions:  INCREASE amlodipine to 10 mg daily  Please check your blood pressure at home daily, write it down.  Call the office or send message via Mychart with the readings in 2 weeks for Dr. Gardiner Rhyme to review.   *If you need a refill on your cardiac medications before your next appointment, please call your pharmacy*  Testing/Procedures:  ZIO XT- Long Term Monitor Instructions   Your physician has requested you wear your ZIO patch monitor___3____days.   This is a single patch monitor.  [employer] supplies one patch monitor per enrollment.  Additional stickers are not available.   Please do not apply patch if you will be having a Nuclear Stress Test, Echocardiogram, Cardiac CT, MRI, or Chest Xray during the time frame you would be wearing the monitor. The patch cannot be worn during these tests.  You cannot remove and re-apply the ZIO XT patch monitor.   Your ZIO patch monitor will be sent USPS Priority mail from [facility] directly to your home address. The monitor may also be mailed to a PO BOX if home delivery is not available.   It may take 3-5 days to receive your monitor after you have been enrolled.   Once you have received you monitor, please review enclosed instructions.  Your monitor has already been registered assigning a specific monitor serial # to you.   Applying the monitor   Shave hair from upper left chest.   Hold abrader disc by orange tab.  Rub abrader in 40 strokes over left upper chest as indicated in your monitor instructions.   Clean area with 4 enclosed alcohol pads .  Use all pads to assure are is cleaned thoroughly.  Let dry.   Apply patch as indicated in monitor instructions.  Patch will be place under collarbone on left side of chest with arrow pointing upward.   Rub patch adhesive wings for 2 minutes.Remove white label marked "1".  Remove white label marked "2".  Rub patch adhesive wings for 2 additional minutes.   While looking in  a mirror, press and release button in center of patch.  A small green light will flash 3-4 times .  This will be your only indicator the monitor has been turned on.     Do not shower for the first 24 hours.  You may shower after the first 24 hours.   Press button if you feel a symptom. You will hear a small click.  Record Date, Time and Symptom in the Patient Log Book.   When you are ready to remove patch, follow instructions on last 2 pages of Patient Log Book.  Stick patch monitor onto last page of Patient Log Book.   Place Patient Log Book in [place] box.  Use locking tab on box and tape box closed securely.  The [employer] and [employer] has [employer] on it.  Please place in mailbox as soon as possible.  Your physician should have your test results approximately 7 days after the monitor has been mailed back to [facility].   Call [place] at [phone] if you have questions regarding your ZIO XT patch monitor.  Call them immediately if you see an orange light blinking on your monitor.   If your monitor falls off in less than 4 days contact our Monitor department at [phone].  If your monitor becomes loose or falls off after 4 days call [employer] at [phone] for suggestions on securing your monitor.  Follow-Up: At [facility], you and your health needs are our priority.  As part of our continuing mission to provide you with exceptional heart care, we have created designated Provider Care Teams.  These Care Teams include your primary Cardiologist (physician) and Advanced Practice Providers (APPs -  Physician Assistants and Nurse Practitioners) who all work together to provide you with the care you need, when you need it.  We recommend signing up for the patient portal called "MyChart".  Sign up information is provided on this After Visit Summary.  MyChart is used to connect with patients for Virtual Visits (Telemedicine).  Patients are able to view lab/test  results, encounter notes, upcoming appointments, etc.  Non-urgent messages can be sent to your provider as well.   To learn more about what you can do with MyChart, go to [email or web address].    Your next appointment:   6 month(s)  The format for your next appointment:   In Person  Provider:   Oswaldo Milian, MD

## 2021-02-13 NOTE — Progress Notes (Signed)
Cardiology Office Note:    Date:  02/14/2021   ID:  Josselyn, Harkins 1946-06-24, MRN [id number]  PCP:  Ginger Organ., MD  Cardiologist:  No primary care provider on file.  Electrophysiologist:  None   Referring MD: Ginger Organ., MD   Chief Complaint  Patient presents with  . Palpitations    History of Present Illness:    April Berg is a 75 y.o. female with a hx of hypertension, hypothyroidism, allergy-induced asthma who presents for follow-up.  She was initially seen on 12/27/2019 for chest pain evaluation.  Reported atypical chest pain.  Coronary CTA was done on 02/07/2020, which showed no evidence of CAD (calcium score 0).  Labs prior to CTA showed hyperkalemia, it was recommended that she switch from telmisartan to amlodipine for her hypertension.  Since last clinic visit, she reports that she has been doing okay.  She denies any further chest pain.  Denies any dyspnea or lower extremity edema.  Does report she has been having some lightheadedness/headaches, which seem to correlate to when BP is elevated.  Reports she has had BP as high as 180s at home. Does report she has been having intermittent palpitations, occur about once per day and can last for up to 10 to 15 minutes.  She has not been exercising.    Past Medical History:  Diagnosis Date  . Anxiety   . Arthritis    osteoarthritis  . Cataract   . Complication of anesthesia 90's   block for elbow surgery and pt had a seizure... surgery since with no problem   . Depression   . GERD (gastroesophageal reflux disease)   . Glaucoma of both eyes   . H/O hiatal hernia   . History of pericarditis    DEC 2008 AND SMALL PERICARDIAL EFFUSION--   RESOLVED  . Hyperlipidemia   . Hypertension   . Hypothyroidism   . IBS (irritable bowel syndrome)   . Interstitial cystitis   . Migraines   . Pelvic pain   . Seizures ([place])    one occurence in the 1990's after anesthesia, no problems since  . Spinal stenosis    . Vitamin D deficiency     Past Surgical History:  Procedure Laterality Date  . ANTERIOR CERVICAL DECOMP/DISCECTOMY FUSION  12/08/2011   Procedure: ANTERIOR CERVICAL DECOMPRESSION/DISCECTOMY FUSION 3 LEVELS;  Surgeon: Sinclair Ship, MD;  Location: [facility];  Service: Orthopedics;  Laterality: N/A;  C 3-6 ACDF  . BREAST BIOPSY Right   . CARDIAC CATHETERIZATION  10-27-2007  DR Eustace Quail   NORMAL CORONARY ARTERIES/ NORMAL LVF  . CARDIOVASCULAR STRESS TEST  02-25-2010  DR CRENSHAW   ANTERIOR ATTENUATION NO SCAR OR ISCHEMIA/ EF 81%  . CATARACT EXTRACTION W/ INTRAOCULAR LENS  IMPLANT, BILATERAL    . CESAREAN SECTION  1975  . COLONOSCOPY    . CYSTO WITH HYDRODISTENSION N/A 06/14/2013   Procedure: CYSTOSCOPY/HYDRODISTENSION/INSTILLATION OF MARCAINE AND PYRIDIUM;  Surgeon: Reece Packer, MD;  Location: [place];  Service: Urology;  Laterality: N/A;  . ELBOW TENDON RELEASE Left 1996  . GLAUCOMA SURGERY Bilateral 2004  . RHINOPLASTY  2010  . THUMB TENDON TRANSPOSITION Right 2012  . TONSILLECTOMY  1951  . TOTAL ABDOMINAL HYSTERECTOMY W/ BILATERAL SALPINGOOPHORECTOMY  1982  . TRANSTHORACIC ECHOCARDIOGRAM  03-04-2010   MODERATE LVH/ NORMAL LVSF/ MILD DIASTOLIC DYSFUNCTION/ EF 92%/ MILD AI  . UPPER GASTROINTESTINAL ENDOSCOPY      Current Medications: Current Meds  Medication Sig  . cholecalciferol (VITAMIN D) 1000 units tablet Take 2,000 Units by mouth daily.  . cyclobenzaprine (FLEXERIL) 10 MG tablet Flexeril 10 mg tablet  Take 1 tablet every 8 hours by oral route as needed for spasm for 10 days.  Marland Kitchen dexlansoprazole (DEXILANT) 60 MG capsule Take 60 mg by mouth daily.  Marland Kitchen escitalopram (LEXAPRO) 10 MG tablet Take 10 mg by mouth daily.   . famciclovir (FAMVIR) 500 MG tablet Take by mouth 3 (three) times daily as needed (outbreak).   . fexofenadine (ALLEGRA) 180 MG tablet Take 180 mg by mouth daily.  . fluticasone (FLONASE) 50 MCG/ACT nasal spray Place 1 spray into  both nostrils daily as needed for allergies.   Marland Kitchen gabapentin (NEURONTIN) 300 MG capsule Take 300 mg by mouth 3 (three) times daily.  Marland Kitchen guaiFENesin (MUCINEX) 600 MG 12 hr tablet Take 600 mg by mouth 2 (two) times daily as needed for cough.  . levothyroxine (SYNTHROID, LEVOTHROID) 125 MCG tablet Take 125 mcg by mouth daily before breakfast.  . LORazepam (ATIVAN) 1 MG tablet Take 1 mg by mouth daily as needed.  . meclizine (ANTIVERT) 50 MG tablet Take 1 tablet (50 mg total) by mouth 3 (three) times daily as needed.  . Multiple Vitamins-Minerals (MULTIVITAMINS THER. W/MINERALS) TABS Take 1 tablet by mouth daily.  Marland Kitchen oxyCODONE (OXY IR/ROXICODONE) 5 MG immediate release tablet Take 10-325 mg by mouth daily as needed for moderate pain or severe pain.   . polyethylene glycol (MIRALAX) packet Use three times daily until bowels move - Max 3 consecutive days  . SYMBICORT 80-4.5 MCG/ACT inhaler Inhale 1 puff into the lungs daily.  . timolol (TIMOPTIC) 0.5 % ophthalmic solution Place 1 drop into both eyes daily.    Current Facility-Administered Medications for the 02/13/21 encounter (Office Visit) with Donato Heinz, MD  Medication  . 0.9 %  sodium chloride infusion     Allergies:   Moxifloxacin hcl in nacl, Nsaids, Sulfa antibiotics, Aspirin, Avelox [moxifloxacin], Chlorthalidone, Hydrochlorothiazide, and Neomycin   Social History   Socioeconomic History  . Marital status: Married    Spouse name: Not on file  . Number of children: 1  . Years of education: Not on file  . Highest education level: Not on file  Occupational History  . Occupation: Retired  Tobacco Use  . Smoking status: Never Smoker  . Smokeless tobacco: Never Used  Vaping Use  . Vaping Use: Never used  Substance and Sexual Activity  . Alcohol use: Yes    Alcohol/week: 2.0 standard drinks    Types: 2 Glasses of wine per week  . Drug use: No  . Sexual activity: Not on file  Other Topics Concern  . Not on file  Social  History Narrative  . Not on file   Social Determinants of Health   Financial Resource Strain: Not on file  Food Insecurity: Not on file  Transportation Needs: Not on file  Physical Activity: Not on file  Stress: Not on file  Social Connections: Not on file     Family History: The patient's family history includes Lung disease in her father; Pulmonary fibrosis in her mother. There is no history of Colon cancer, Esophageal cancer, Rectal cancer, or Stomach cancer.  ROS:   Please see the history of present illness.    All other systems reviewed and are negative.  EKGs/Labs/Other Studies Reviewed:    The following studies were reviewed today:   EKG:  EKG is ordered today.  The  ekg ordered demonstrates normal sinus rhythm, rate 70, nonspecific T wave flattening  Coronary CTA 02/07/20: 1. No evidence of CAD, CADRADS = 0.  2. Coronary calcium score of 0. This was 0 percentile for age and sex matched control.  3. Normal coronary origin with right dominance.  IMPRESSION: No acute extra cardiac abnormality.   Recent Labs: 04/01/2020: BUN 18; Creatinine, Ser 0.91; Potassium 4.7; Sodium 140  Recent Lipid Panel No results found for: CHOL, TRIG, HDL, CHOLHDL, VLDL, LDLCALC, LDLDIRECT  Physical Exam:    VS:  BP (!) 160/79   Pulse 71   Ht 5\' 3"  (1.6 m)   Wt 152 lb 12.8 oz (69.3 kg)   SpO2 96%   BMI 27.07 kg/m     Wt Readings from Last 3 Encounters:  02/13/21 152 lb 12.8 oz (69.3 kg)  04/01/20 142 lb 6.4 oz (64.6 kg)  01/31/20 142 lb (64.4 kg)     GEN: Well nourished, well developed in no acute distress HEENT: Normal NECK: No JVD CARDIAC: RRR, no murmurs, rubs, gallops RESPIRATORY:  Clear to auscultation without rales, wheezing or rhonchi  ABDOMEN: Soft, non-tender, non-distended MUSCULOSKELETAL:  No edema; No deformity  SKIN: Warm and dry NEUROLOGIC:  Alert and oriented x 3 PSYCHIATRIC:  Normal affect   ASSESSMENT:    1. Palpitations   2. Chest pain,  unspecified type   3. Essential hypertension   4. Hyperlipidemia, unspecified hyperlipidemia type    PLAN:     Chest pain: Atypical in description.  Coronary CTA on 02/07/2020 showed no CAD (calcium score 0).  Reports no recent episodes of chest pain.  Palpitations: Description concerning for arrhythmia, will evaluate with Zio patch x3 days  Hypertension: Had been on telmisartan 40 mg daily, but developed hyperkalemia.  Switched to amlodipine 5 mg daily.  BP elevated, will increase amlodipine to 10 mg daily.  Asked patient to check BP daily for next 2 weeks and call with results.  Hyperlipidemia: LDL 187 on 01/01/2019.  Has been unable to tolerate any statins.  Calcium score 0 as above.  She has been working on diet/exercise and LDL 134 on 01/08/2021  RTC in 6 months  Medication Adjustments/Labs and Tests Ordered: Current medicines are reviewed at length with the patient today.  Concerns regarding medicines are outlined above.  Orders Placed This Encounter  Procedures  . LONG TERM MONITOR (3-14 DAYS)  . EKG 12-Lead   Meds ordered this encounter  Medications  . amLODipine (NORVASC) 10 MG tablet    Sig: Take 1 tablet (10 mg total) by mouth daily.    Dispense:  90 tablet    Refill:  3    Patient Instructions  Medication Instructions:  INCREASE amlodipine to 10 mg daily  Please check your blood pressure at home daily, write it down.  Call the office or send message via Mychart with the readings in 2 weeks for Dr. Gardiner Rhyme to review.   *If you need a refill on your cardiac medications before your next appointment, please call your pharmacy*  Testing/Procedures:  ZIO XT- Long Term Monitor Instructions   Your physician has requested you wear your ZIO patch monitor___3____days.   This is a single patch monitor.  [employer] supplies one patch monitor per enrollment.  Additional stickers are not available.   Please do not apply patch if you will be having a Nuclear Stress Test,  Echocardiogram, Cardiac CT, MRI, or Chest Xray during the time frame you would be wearing the monitor. The patch cannot  be worn during these tests.  You cannot remove and re-apply the ZIO XT patch monitor.   Your ZIO patch monitor will be sent USPS Priority mail from [facility] directly to your home address. The monitor may also be mailed to a PO BOX if home delivery is not available.   It may take 3-5 days to receive your monitor after you have been enrolled.   Once you have received you monitor, please review enclosed instructions.  Your monitor has already been registered assigning a specific monitor serial # to you.   Applying the monitor   Shave hair from upper left chest.   Hold abrader disc by orange tab.  Rub abrader in 40 strokes over left upper chest as indicated in your monitor instructions.   Clean area with 4 enclosed alcohol pads .  Use all pads to assure are is cleaned thoroughly.  Let dry.   Apply patch as indicated in monitor instructions.  Patch will be place under collarbone on left side of chest with arrow pointing upward.   Rub patch adhesive wings for 2 minutes.Remove white label marked "1".  Remove white label marked "2".  Rub patch adhesive wings for 2 additional minutes.   While looking in a mirror, press and release button in center of patch.  A small green light will flash 3-4 times .  This will be your only indicator the monitor has been turned on.     Do not shower for the first 24 hours.  You may shower after the first 24 hours.   Press button if you feel a symptom. You will hear a small click.  Record Date, Time and Symptom in the Patient Log Book.   When you are ready to remove patch, follow instructions on last 2 pages of Patient Log Book.  Stick patch monitor onto last page of Patient Log Book.   Place Patient Log Book in [place] box.  Use locking tab on box and tape box closed securely.  The [employer] has [employer] on it.  Please  place in mailbox as soon as possible.  Your physician should have your test results approximately 7 days after the monitor has been mailed back to [facility].   Call [place] at [phone] if you have questions regarding your ZIO XT patch monitor.  Call them immediately if you see an orange light blinking on your monitor.   If your monitor falls off in less than 4 days contact our Monitor department at [phone].  If your monitor becomes loose or falls off after 4 days call [employer] at [phone] for suggestions on securing your monitor.     Follow-Up: At [facility], you and your health needs are our priority.  As part of our continuing mission to provide you with exceptional heart care, we have created designated Provider Care Teams.  These Care Teams include your primary Cardiologist (physician) and Advanced Practice Providers (APPs -  Physician Assistants and Nurse Practitioners) who all work together to provide you with the care you need, when you need it.  We recommend signing up for the patient portal called "MyChart".  Sign up information is provided on this After Visit Summary.  MyChart is used to connect with patients for Virtual Visits (Telemedicine).  Patients are able to view lab/test results, encounter notes, upcoming appointments, etc.  Non-urgent messages can be sent to your provider as well.   To learn more about what you can do with  MyChart, go to [email or web address].    Your next appointment:   6 month(s)  The format for your next appointment:   In Person  Provider:   Oswaldo Milian, MD       Signed, Donato Heinz, MD  02/14/2021 5:48 PM    [place]

## 2021-02-13 NOTE — Progress Notes (Signed)
Enrolled patient for a 3 day Zio XT Monitor to be mailed to patients home.  °

## 2021-02-18 DIAGNOSIS — R002 Palpitations: Secondary | ICD-10-CM | POA: Diagnosis not present

## 2021-02-24 DIAGNOSIS — R002 Palpitations: Secondary | ICD-10-CM | POA: Diagnosis not present

## 2021-03-02 ENCOUNTER — Telehealth: Payer: Self-pay | Admitting: Cardiology

## 2021-03-02 NOTE — Telephone Encounter (Signed)
Pt c/o medication issue:  1. Name of Medication: amLODipine (NORVASC) 10 MG tablet  2. How are you currently taking this medication (dosage and times per day)? 1 tablet daily   3. Are you having a reaction (difficulty breathing--STAT)? no  4. What is your medication issue? Since patient started taking the medication her ankles, legs and hands has been swollen. Patient stated that she doesn't want to keep taking the medication if this is the side effect

## 2021-03-02 NOTE — Telephone Encounter (Signed)
Spoke with patient and since increasing Amlodipine to 10 mg has had swelling in hands, ankles, feet, and legs Blood pressure once down to 125/81 but mostly running in the 140'/80's  She does not want to continue the Amlodipine secondary to swelling which is uncomfortable Will forward to Dr Gardiner Rhyme for review

## 2021-03-02 NOTE — Telephone Encounter (Signed)
Recommend decreasing amlodipine dose back to 5 mg daily.  Will monitor BP daily for next 2 weeks and call with results, may need additional medication.

## 2021-03-04 MED ORDER — AMLODIPINE BESYLATE 10 MG PO TABS: 5.0000 mg | ORAL_TABLET | Freq: Every day | ORAL | 3 refills | Status: DC

## 2021-03-04 NOTE — Telephone Encounter (Signed)
Spoke to patient, she decreases amlodipine to 5 mg on Monday, swelling is improving.   Advised to continue 5mg  as recommended and monitor BP x 2 weeks and call with readings.    Patient verbalized understanding.

## 2021-03-05 ENCOUNTER — Ambulatory Visit
Admission: RE | Admit: 2021-03-05 | Discharge: 2021-03-05 | Disposition: A | Discharge status: Home / Self Care | Payer: Medicare Other | Source: Ambulatory Visit | Attending: Internal Medicine | Admitting: Internal Medicine

## 2021-03-05 ENCOUNTER — Other Ambulatory Visit: Payer: Self-pay

## 2021-03-05 DIAGNOSIS — Z1231 Encounter for screening mammogram for malignant neoplasm of breast: Secondary | ICD-10-CM

## 2021-03-18 DIAGNOSIS — Z961 Presence of intraocular lens: Secondary | ICD-10-CM | POA: Diagnosis not present

## 2021-03-18 DIAGNOSIS — H401131 Primary open-angle glaucoma, bilateral, mild stage: Secondary | ICD-10-CM | POA: Diagnosis not present

## 2021-03-20 DIAGNOSIS — G894 Chronic pain syndrome: Secondary | ICD-10-CM | POA: Diagnosis not present

## 2021-03-20 DIAGNOSIS — M961 Postlaminectomy syndrome, not elsewhere classified: Secondary | ICD-10-CM | POA: Diagnosis not present

## 2021-03-20 DIAGNOSIS — M47818 Spondylosis without myelopathy or radiculopathy, sacral and sacrococcygeal region: Secondary | ICD-10-CM | POA: Diagnosis not present

## 2021-03-20 DIAGNOSIS — M5416 Radiculopathy, lumbar region: Secondary | ICD-10-CM | POA: Diagnosis not present

## 2021-04-06 DIAGNOSIS — M961 Postlaminectomy syndrome, not elsewhere classified: Secondary | ICD-10-CM | POA: Diagnosis not present

## 2021-04-06 DIAGNOSIS — M5416 Radiculopathy, lumbar region: Secondary | ICD-10-CM | POA: Diagnosis not present

## 2021-04-06 DIAGNOSIS — G894 Chronic pain syndrome: Secondary | ICD-10-CM | POA: Diagnosis not present

## 2021-04-06 DIAGNOSIS — M47818 Spondylosis without myelopathy or radiculopathy, sacral and sacrococcygeal region: Secondary | ICD-10-CM | POA: Diagnosis not present

## 2021-04-16 DIAGNOSIS — M461 Sacroiliitis, not elsewhere classified: Secondary | ICD-10-CM | POA: Diagnosis not present

## 2021-04-24 ENCOUNTER — Other Ambulatory Visit: Payer: Self-pay | Admitting: Cardiology

## 2021-05-12 DIAGNOSIS — N390 Urinary tract infection, site not specified: Secondary | ICD-10-CM | POA: Diagnosis not present

## 2021-05-12 DIAGNOSIS — L219 Seborrheic dermatitis, unspecified: Secondary | ICD-10-CM | POA: Diagnosis not present

## 2021-05-12 DIAGNOSIS — R300 Dysuria: Secondary | ICD-10-CM | POA: Diagnosis not present

## 2021-05-12 DIAGNOSIS — I10 Essential (primary) hypertension: Secondary | ICD-10-CM | POA: Diagnosis not present

## 2021-05-12 DIAGNOSIS — M545 Low back pain, unspecified: Secondary | ICD-10-CM | POA: Diagnosis not present

## 2021-05-12 DIAGNOSIS — R600 Localized edema: Secondary | ICD-10-CM | POA: Diagnosis not present

## 2021-05-12 DIAGNOSIS — N3010 Interstitial cystitis (chronic) without hematuria: Secondary | ICD-10-CM | POA: Diagnosis not present

## 2021-05-13 DIAGNOSIS — Z79899 Other long term (current) drug therapy: Secondary | ICD-10-CM | POA: Diagnosis not present

## 2021-05-13 DIAGNOSIS — L409 Psoriasis, unspecified: Secondary | ICD-10-CM | POA: Diagnosis not present

## 2021-05-13 DIAGNOSIS — Z5181 Encounter for therapeutic drug level monitoring: Secondary | ICD-10-CM | POA: Diagnosis not present

## 2021-05-13 DIAGNOSIS — L4050 Arthropathic psoriasis, unspecified: Secondary | ICD-10-CM | POA: Diagnosis not present

## 2021-05-20 DIAGNOSIS — M2022 Hallux rigidus, left foot: Secondary | ICD-10-CM | POA: Diagnosis not present

## 2021-05-20 DIAGNOSIS — M79672 Pain in left foot: Secondary | ICD-10-CM | POA: Diagnosis not present

## 2021-05-21 ENCOUNTER — Other Ambulatory Visit: Payer: Self-pay

## 2021-05-21 ENCOUNTER — Emergency Department (HOSPITAL_BASED_OUTPATIENT_CLINIC_OR_DEPARTMENT_OTHER)
Admission: EM | Admit: 2021-05-21 | Discharge: 2021-05-21 | Disposition: A | Discharge status: Home / Self Care | Payer: Medicare Other | Attending: Emergency Medicine | Admitting: Emergency Medicine

## 2021-05-21 ENCOUNTER — Emergency Department (HOSPITAL_BASED_OUTPATIENT_CLINIC_OR_DEPARTMENT_OTHER): Payer: Medicare Other

## 2021-05-21 ENCOUNTER — Encounter (HOSPITAL_BASED_OUTPATIENT_CLINIC_OR_DEPARTMENT_OTHER): Payer: Self-pay

## 2021-05-21 DIAGNOSIS — I10 Essential (primary) hypertension: Secondary | ICD-10-CM | POA: Insufficient documentation

## 2021-05-21 DIAGNOSIS — R059 Cough, unspecified: Secondary | ICD-10-CM | POA: Diagnosis not present

## 2021-05-21 DIAGNOSIS — J069 Acute upper respiratory infection, unspecified: Secondary | ICD-10-CM

## 2021-05-21 DIAGNOSIS — Z7951 Long term (current) use of inhaled steroids: Secondary | ICD-10-CM | POA: Diagnosis not present

## 2021-05-21 DIAGNOSIS — E039 Hypothyroidism, unspecified: Secondary | ICD-10-CM | POA: Diagnosis not present

## 2021-05-21 DIAGNOSIS — Z79899 Other long term (current) drug therapy: Secondary | ICD-10-CM | POA: Diagnosis not present

## 2021-05-21 DIAGNOSIS — Z20822 Contact with and (suspected) exposure to covid-19: Secondary | ICD-10-CM

## 2021-05-21 DIAGNOSIS — J40 Bronchitis, not specified as acute or chronic: Secondary | ICD-10-CM | POA: Diagnosis not present

## 2021-05-21 LAB — RESP PANEL BY RT-PCR (FLU A&B, COVID) ARPGX2
Influenza A by PCR: NEGATIVE
Influenza B by PCR: NEGATIVE
SARS Coronavirus 2 by RT PCR: NEGATIVE

## 2021-05-21 MED ORDER — AZITHROMYCIN 250 MG PO TABS: 250.0000 mg | ORAL_TABLET | Freq: Every day | ORAL | 0 refills | Status: DC

## 2021-05-21 MED ORDER — BENZONATATE 100 MG PO CAPS: 100.0000 mg | ORAL_CAPSULE | Freq: Three times a day (TID) | ORAL | 0 refills | Status: DC | PRN

## 2021-05-21 MED ORDER — NIRMATRELVIR/RITONAVIR (PAXLOVID)TABLET: 3.0000 | ORAL_TABLET | Freq: Two times a day (BID) | ORAL | 0 refills | Status: AC

## 2021-05-21 NOTE — ED Triage Notes (Signed)
Patient here POV from Home with Cough.  Patient states she had a cough that began yesterday. Cough worsened this AM in intensity.  Ambulatory, GCS 15. No Fevers. No Known Sick Contacts.

## 2021-05-21 NOTE — ED Provider Notes (Signed)
[facility] EMERGENCY DEPT Provider Note   CSN: [id number] Arrival date & time: 05/21/21  0645     History Chief Complaint  Patient presents with   Cough    April Berg is a 75 y.o. female.  HPI     75yo female with history of hypertension, hyperlipidemia, hypothyroidism, allergy-induced asthma, recent clinical diagnosis of psoriatic arthritis started on methotrexate who presents with concern for cough.   Recently on keflex for UTI stopped yesterday Day before yesterday began coughing more, yesterday more Last night cough worsened Coughing up green phlegm Severe fatigue this AM Hx of osteoarthritis, 3wk ago had severe flare, developed rash on chest, intermittently on scalp, saw colleague at [facility], thought clinically could be psoriatic arthritis, given triamcinolone and started methotrexate Sunday night No shortness of breath, no chest pain Sore throat this AM, congestion some rhinorrhea yesterday Both vaccinations, booster, had COVID mother's day, took paxlovid Does not typically get fevers when sick, not known now but did have chills last night, no new body aches other than arthritis pain 6/13-6/19 was in [place], had arthritis, developed UTI symptoms 6/21.    Past Medical History:  Diagnosis Date   Anxiety    Arthritis    osteoarthritis   Cataract    Complication of anesthesia 90's   block for elbow surgery and pt had a seizure... surgery since with no problem    Depression    GERD (gastroesophageal reflux disease)    Glaucoma of both eyes    H/O hiatal hernia    History of pericarditis    DEC 2008 AND SMALL PERICARDIAL EFFUSION--   RESOLVED   Hyperlipidemia    Hypertension    Hypothyroidism    IBS (irritable bowel syndrome)    Interstitial cystitis    Migraines    Pelvic pain    Seizures ([place])    one occurence in the 1990's after anesthesia, no problems since   Spinal stenosis    Vitamin D deficiency     Patient Active Problem List    Diagnosis Date Noted   Ingrown toenail 09/11/2018   Pain of left hand 05/10/2018   Bilateral shoulder pain 03/22/2018   Fusion of spine of lumbosacral region 03/25/2016   Choroidal nevus, left 12/16/2015   Epiretinal membrane (ERM) of left eye 12/16/2015   Intermediate stage nonexudative age-related macular degeneration of both eyes 12/16/2015   Pseudophakia of both eyes 12/16/2015   Vertigo 01/30/2015   Neck pain 01/30/2015   Other cervical disc degeneration, unspecified cervical region 01/20/2015   Laryngopharyngeal reflux (LPR) 08/15/2013   Cervical radiculopathy 10/03/2012   Balance problems 07/10/2012   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 07/10/2012   Tinnitus 07/10/2012   Allergic rhinitis 05/02/2012   Chronic sinusitis 05/02/2012   Facet arthritis of cervical region 12/08/2011   ABDOMINAL PAIN-RUQ 03/26/2010   EPIGASTRIC PAIN 03/23/2010   NONSPECIFIC ABNORMAL FIND RAD&OTH EXAM GI TRACT 07/10/2008   HYPOTHYROIDISM 07/09/2008   Major depression 07/09/2008   GLAUCOMA 07/09/2008   GERD 07/09/2008   IRRITABLE BOWEL SYNDROME 07/09/2008   ARTHRITIS 07/09/2008   CHEST PAIN 07/09/2008   Glaucoma 07/09/2008    Past Surgical History:  Procedure Laterality Date   ANTERIOR CERVICAL DECOMP/DISCECTOMY FUSION  12/08/2011   Procedure: ANTERIOR CERVICAL DECOMPRESSION/DISCECTOMY FUSION 3 LEVELS;  Surgeon: Sinclair Ship, MD;  Location: [facility];  Service: Orthopedics;  Laterality: N/A;  C 3-6 ACDF  BREAST BIOPSY Right    CARDIAC CATHETERIZATION  10-27-2007  DR Eustace Quail   NORMAL CORONARY ARTERIES/ NORMAL LVF   CARDIOVASCULAR STRESS TEST  02-25-2010  DR CRENSHAW   ANTERIOR ATTENUATION NO SCAR OR ISCHEMIA/ EF 81%   CATARACT EXTRACTION W/ INTRAOCULAR LENS  IMPLANT, BILATERAL     CESAREAN SECTION  1975   COLONOSCOPY     CYSTO WITH HYDRODISTENSION N/A 06/14/2013   Procedure:  CYSTOSCOPY/HYDRODISTENSION/INSTILLATION OF MARCAINE AND PYRIDIUM;  Surgeon: Reece Packer, MD;  Location: [facility];  Service: Urology;  Laterality: N/A;   ELBOW TENDON RELEASE Left 1996   GLAUCOMA SURGERY Bilateral 2004   RHINOPLASTY  2010   THUMB TENDON TRANSPOSITION Right 2012   TONSILLECTOMY  1951   TOTAL ABDOMINAL HYSTERECTOMY W/ BILATERAL SALPINGOOPHORECTOMY  1982   TRANSTHORACIC ECHOCARDIOGRAM  03-04-2010   MODERATE LVH/ NORMAL LVSF/ MILD DIASTOLIC DYSFUNCTION/ EF 30%/ MILD AI   UPPER GASTROINTESTINAL ENDOSCOPY       OB History   No obstetric history on file.     Family History  Problem Relation Age of Onset   Pulmonary fibrosis Mother    Lung disease Father    Breast cancer Maternal Aunt    Colon cancer Neg Hx    Esophageal cancer Neg Hx    Rectal cancer Neg Hx    Stomach cancer Neg Hx     Social History   Tobacco Use   Smoking status: Never   Smokeless tobacco: Never  Vaping Use   Vaping Use: Never used  Substance Use Topics   Alcohol use: Yes    Alcohol/week: 2.0 standard drinks    Types: 2 Glasses of wine per week   Drug use: No    Home Medications Prior to Admission medications   Medication Sig Start Date End Date Taking? Authorizing Provider  azithromycin (ZITHROMAX) 250 MG tablet Take 1 tablet (250 mg total) by mouth daily. Take first 2 tablets together, then 1 every day until finished. 05/21/21  Yes Gareth Morgan, MD  nirmatrelvir/ritonavir EUA (PAXLOVID) TABS Take 3 tablets by mouth 2 (two) times daily for 5 days. Patient GFR is 64. Take nirmatrelvir (150 mg) two tablets twice daily for 5 days and ritonavir (100 mg) one tablet twice daily for 5 days. 05/21/21 05/26/21 Yes Gareth Morgan, MD  amLODipine (NORVASC) 10 MG tablet Take 0.5 tablets (5 mg total) by mouth daily. 03/04/21 02/27/22  Donato Heinz, MD  amLODipine (NORVASC) 5 MG tablet TAKE 1 TABLET BY MOUTH DAILY. 04/24/21   Donato Heinz, MD  cholecalciferol  (VITAMIN D) 1000 units tablet Take 2,000 Units by mouth daily.    [provider]  cyclobenzaprine (FLEXERIL) 10 MG tablet Flexeril 10 mg tablet  Take 1 tablet every 8 hours by oral route as needed for spasm for 10 days.    [provider]  dexlansoprazole (DEXILANT) 60 MG capsule Take 60 mg by mouth daily.    [provider]  escitalopram (LEXAPRO) 10 MG tablet Take 10 mg by mouth daily.  08/18/15   [provider]  famciclovir (FAMVIR) 500 MG tablet Take by mouth 3 (three) times daily as needed (outbreak).     [provider]  fexofenadine (ALLEGRA) 180 MG tablet Take 180 mg by mouth daily.    [provider]  fluticasone (FLONASE) 50 MCG/ACT nasal spray Place 1 spray into both nostrils daily as needed for allergies.     [provider]  gabapentin (NEURONTIN) 300 MG capsule Take  300 mg by mouth 3 (three) times daily. 08/14/19   [provider]  guaiFENesin (MUCINEX) 600 MG 12 hr tablet Take 600 mg by mouth 2 (two) times daily as needed for cough.    [provider]  levothyroxine (SYNTHROID, LEVOTHROID) 125 MCG tablet Take 125 mcg by mouth daily before breakfast.    [provider]  LORazepam (ATIVAN) 1 MG tablet Take 1 mg by mouth daily as needed. 04/11/19   [provider]  meclizine (ANTIVERT) 50 MG tablet Take 1 tablet (50 mg total) by mouth 3 (three) times daily as needed. 08/26/15   Fransico Meadow, PA-C  Multiple Vitamins-Minerals (MULTIVITAMINS THER. W/MINERALS) TABS Take 1 tablet by mouth daily.    [provider]  oxyCODONE (OXY IR/ROXICODONE) 5 MG immediate release tablet Take 10-325 mg by mouth daily as needed for moderate pain or severe pain.     [provider]  polyethylene glycol [facility]) packet Use three times daily until bowels move - Max 3 consecutive days 03/31/16   Charlann Lange, PA-C  SYMBICORT 80-4.5 MCG/ACT inhaler Inhale 1 puff into the lungs daily. 07/17/19    [provider]  timolol (TIMOPTIC) 0.5 % ophthalmic solution Place 1 drop into both eyes daily.     [provider]    Allergies    Moxifloxacin hcl in nacl, Nsaids, Sulfa antibiotics, Aspirin, Avelox [moxifloxacin], Chlorthalidone, Hydrochlorothiazide, and Neomycin  Review of Systems   Review of Systems  Constitutional:  Positive for chills and fatigue. Negative for fever.  HENT:  Positive for congestion, rhinorrhea and sore throat.   Respiratory:  Positive for cough. Negative for shortness of breath.   Cardiovascular:  Negative for chest pain.  Gastrointestinal:  Negative for abdominal pain, nausea and vomiting.  Genitourinary:  Negative for dysuria.  Musculoskeletal:  Positive for arthralgias.  Skin:  Negative for rash (now resolved).  Neurological:  Negative for headaches.   Physical Exam Updated Vital Signs BP (!) 152/70 (BP Location: Right Arm)   Pulse 70   Temp 98.8 F (37.1 C) (Oral)   Resp 16   Ht 5\' 3"  (1.6 m)   Wt 68 kg   SpO2 96%   BMI 26.57 kg/m   Physical Exam Vitals and nursing note reviewed.  Constitutional:      General: She is not in acute distress.    Appearance: She is well-developed. She is not diaphoretic.  HENT:     Head: Normocephalic and atraumatic.     Mouth/Throat:     Mouth: Mucous membranes are moist.     Pharynx: No oropharyngeal exudate or posterior oropharyngeal erythema.  Eyes:     Conjunctiva/sclera: Conjunctivae normal.  Cardiovascular:     Rate and Rhythm: Normal rate and regular rhythm.     Heart sounds: Normal heart sounds. No murmur heard.   No friction rub. No gallop.  Pulmonary:     Effort: Pulmonary effort is normal. No respiratory distress.     Breath sounds: Normal breath sounds. No wheezing or rales.  Abdominal:     General: There is no distension.     Palpations: Abdomen is soft.     Tenderness: There is no abdominal tenderness. There is no guarding.  Musculoskeletal:        General: No  tenderness.     Cervical back: Normal range of motion.  Skin:    General: Skin is warm and dry.     Findings: No erythema or rash.  Neurological:  Mental Status: She is alert and oriented to person, place, and time.    ED Results / Procedures / Treatments   Labs (all labs ordered are listed, but only abnormal results are displayed) Labs Reviewed  RESP PANEL BY RT-PCR (FLU A&B, COVID) [id number]    EKG None  Radiology No results found.  Procedures Procedures   Medications Ordered in ED Medications - No data to display  ED Course  I have reviewed the triage vital signs and the nursing notes.  Pertinent labs & imaging results that were available during my care of the patient were reviewed by me and considered in my medical decision making (see chart for details).    MDM Rules/Calculators/A&P                           75yo female with history of hypertension, hyperlipidemia, hypothyroidism, allergy-induced asthma, recent clinical diagnosis of psoriatic arthritis started on methotrexate who presents with concern for cough.   DDx includes pneumonia, viral infection including COVID 8.  Do not see signs of CHF or suspect PE.  CXR obtained shows no acute abnormalities.   Discussed supportive care for likely viral etiology of symptoms.  She is higher risk for worsening and husband is a physician and prefers antibiotics for possible bacterial bronchitis and feel this is not unreasonable in this clinical setting given her high risk and possibility of occult bacterial infection.    She recently had labwork showing a normal GFR 6/22 and do not have reason to suspect significant change since that time.  Discussed with recent COVID infection in May, if she is positive it may be secondary to that however reinfection is also possible in setting of new variants and new symptoms.  COVID 19 testing performed and wrote rx for paxlovid in case it returns positive. Patient discharged in stable  condition with understanding of reasons to return.         Final Clinical Impression(s) / ED Diagnoses Final diagnoses:  Bronchitis  COVID-19 virus test result unknown    Rx / DC Orders ED Discharge Orders          Ordered    azithromycin (ZITHROMAX) 250 MG tablet  Daily        05/21/21 0741    nirmatrelvir/ritonavir EUA (PAXLOVID) TABS  2 times daily        05/21/21 0741             Gareth Morgan, MD 05/21/21 [phone]

## 2021-05-21 NOTE — ED Notes (Signed)
ED Provider at bedside. 

## 2021-05-27 DIAGNOSIS — L600 Ingrowing nail: Secondary | ICD-10-CM | POA: Diagnosis not present

## 2021-06-01 DIAGNOSIS — G894 Chronic pain syndrome: Secondary | ICD-10-CM | POA: Diagnosis not present

## 2021-06-01 DIAGNOSIS — Z79891 Long term (current) use of opiate analgesic: Secondary | ICD-10-CM | POA: Diagnosis not present

## 2021-06-01 DIAGNOSIS — M5416 Radiculopathy, lumbar region: Secondary | ICD-10-CM | POA: Diagnosis not present

## 2021-06-01 DIAGNOSIS — M47812 Spondylosis without myelopathy or radiculopathy, cervical region: Secondary | ICD-10-CM | POA: Diagnosis not present

## 2021-06-01 DIAGNOSIS — M47818 Spondylosis without myelopathy or radiculopathy, sacral and sacrococcygeal region: Secondary | ICD-10-CM | POA: Diagnosis not present

## 2021-06-01 DIAGNOSIS — M961 Postlaminectomy syndrome, not elsewhere classified: Secondary | ICD-10-CM | POA: Diagnosis not present

## 2021-06-01 DIAGNOSIS — M150 Primary generalized (osteo)arthritis: Secondary | ICD-10-CM | POA: Diagnosis not present

## 2021-06-04 DIAGNOSIS — M5030 Other cervical disc degeneration, unspecified cervical region: Secondary | ICD-10-CM | POA: Diagnosis not present

## 2021-06-04 DIAGNOSIS — Z6827 Body mass index (BMI) 27.0-27.9, adult: Secondary | ICD-10-CM | POA: Diagnosis not present

## 2021-06-04 DIAGNOSIS — M150 Primary generalized (osteo)arthritis: Secondary | ICD-10-CM | POA: Diagnosis not present

## 2021-06-04 DIAGNOSIS — L4059 Other psoriatic arthropathy: Secondary | ICD-10-CM | POA: Diagnosis not present

## 2021-06-04 DIAGNOSIS — M5136 Other intervertebral disc degeneration, lumbar region: Secondary | ICD-10-CM | POA: Diagnosis not present

## 2021-06-04 DIAGNOSIS — E663 Overweight: Secondary | ICD-10-CM | POA: Diagnosis not present

## 2021-06-16 DIAGNOSIS — M2022 Hallux rigidus, left foot: Secondary | ICD-10-CM | POA: Diagnosis not present

## 2021-07-08 DIAGNOSIS — M25511 Pain in right shoulder: Secondary | ICD-10-CM | POA: Diagnosis not present

## 2021-07-13 DIAGNOSIS — M961 Postlaminectomy syndrome, not elsewhere classified: Secondary | ICD-10-CM | POA: Diagnosis not present

## 2021-07-13 DIAGNOSIS — M47818 Spondylosis without myelopathy or radiculopathy, sacral and sacrococcygeal region: Secondary | ICD-10-CM | POA: Diagnosis not present

## 2021-07-13 DIAGNOSIS — M5416 Radiculopathy, lumbar region: Secondary | ICD-10-CM | POA: Diagnosis not present

## 2021-07-13 DIAGNOSIS — G894 Chronic pain syndrome: Secondary | ICD-10-CM | POA: Diagnosis not present

## 2021-07-29 DIAGNOSIS — Z4889 Encounter for other specified surgical aftercare: Secondary | ICD-10-CM | POA: Diagnosis not present

## 2021-07-29 DIAGNOSIS — M2022 Hallux rigidus, left foot: Secondary | ICD-10-CM | POA: Diagnosis not present

## 2021-07-29 DIAGNOSIS — M79672 Pain in left foot: Secondary | ICD-10-CM | POA: Diagnosis not present

## 2021-08-12 DIAGNOSIS — Z79899 Other long term (current) drug therapy: Secondary | ICD-10-CM | POA: Diagnosis not present

## 2021-08-12 DIAGNOSIS — L409 Psoriasis, unspecified: Secondary | ICD-10-CM | POA: Diagnosis not present

## 2021-08-12 DIAGNOSIS — L4050 Arthropathic psoriasis, unspecified: Secondary | ICD-10-CM | POA: Diagnosis not present

## 2021-08-14 DIAGNOSIS — Z23 Encounter for immunization: Secondary | ICD-10-CM | POA: Diagnosis not present

## 2021-08-19 DIAGNOSIS — Z961 Presence of intraocular lens: Secondary | ICD-10-CM | POA: Diagnosis not present

## 2021-08-19 DIAGNOSIS — D3132 Benign neoplasm of left choroid: Secondary | ICD-10-CM | POA: Diagnosis not present

## 2021-08-19 DIAGNOSIS — H43393 Other vitreous opacities, bilateral: Secondary | ICD-10-CM | POA: Diagnosis not present

## 2021-08-19 DIAGNOSIS — H35372 Puckering of macula, left eye: Secondary | ICD-10-CM | POA: Diagnosis not present

## 2021-08-20 DIAGNOSIS — Z4889 Encounter for other specified surgical aftercare: Secondary | ICD-10-CM | POA: Diagnosis not present

## 2021-08-20 DIAGNOSIS — M2022 Hallux rigidus, left foot: Secondary | ICD-10-CM | POA: Diagnosis not present

## 2021-08-20 DIAGNOSIS — M79672 Pain in left foot: Secondary | ICD-10-CM | POA: Diagnosis not present

## 2021-08-26 DIAGNOSIS — R059 Cough, unspecified: Secondary | ICD-10-CM | POA: Diagnosis not present

## 2021-08-26 DIAGNOSIS — J309 Allergic rhinitis, unspecified: Secondary | ICD-10-CM | POA: Diagnosis not present

## 2021-08-26 DIAGNOSIS — Z1152 Encounter for screening for COVID-19: Secondary | ICD-10-CM | POA: Diagnosis not present

## 2021-08-26 DIAGNOSIS — R519 Headache, unspecified: Secondary | ICD-10-CM | POA: Diagnosis not present

## 2021-08-26 DIAGNOSIS — R5383 Other fatigue: Secondary | ICD-10-CM | POA: Diagnosis not present

## 2021-08-26 DIAGNOSIS — J45909 Unspecified asthma, uncomplicated: Secondary | ICD-10-CM | POA: Diagnosis not present

## 2021-08-26 DIAGNOSIS — J0101 Acute recurrent maxillary sinusitis: Secondary | ICD-10-CM | POA: Diagnosis not present

## 2021-09-03 DIAGNOSIS — R300 Dysuria: Secondary | ICD-10-CM | POA: Diagnosis not present

## 2021-09-03 DIAGNOSIS — N3010 Interstitial cystitis (chronic) without hematuria: Secondary | ICD-10-CM | POA: Diagnosis not present

## 2021-09-07 DIAGNOSIS — E663 Overweight: Secondary | ICD-10-CM | POA: Diagnosis not present

## 2021-09-07 DIAGNOSIS — M150 Primary generalized (osteo)arthritis: Secondary | ICD-10-CM | POA: Diagnosis not present

## 2021-09-07 DIAGNOSIS — L4059 Other psoriatic arthropathy: Secondary | ICD-10-CM | POA: Diagnosis not present

## 2021-09-07 DIAGNOSIS — M5030 Other cervical disc degeneration, unspecified cervical region: Secondary | ICD-10-CM | POA: Diagnosis not present

## 2021-09-07 DIAGNOSIS — M5136 Other intervertebral disc degeneration, lumbar region: Secondary | ICD-10-CM | POA: Diagnosis not present

## 2021-09-07 DIAGNOSIS — Z6828 Body mass index (BMI) 28.0-28.9, adult: Secondary | ICD-10-CM | POA: Diagnosis not present

## 2021-09-14 DIAGNOSIS — Z981 Arthrodesis status: Secondary | ICD-10-CM | POA: Diagnosis not present

## 2021-09-14 DIAGNOSIS — M5416 Radiculopathy, lumbar region: Secondary | ICD-10-CM | POA: Diagnosis not present

## 2021-09-14 DIAGNOSIS — M25552 Pain in left hip: Secondary | ICD-10-CM | POA: Diagnosis not present

## 2021-09-18 DIAGNOSIS — M545 Low back pain, unspecified: Secondary | ICD-10-CM | POA: Diagnosis not present

## 2021-09-18 DIAGNOSIS — M2022 Hallux rigidus, left foot: Secondary | ICD-10-CM | POA: Diagnosis not present

## 2021-09-29 DIAGNOSIS — D3132 Benign neoplasm of left choroid: Secondary | ICD-10-CM | POA: Diagnosis not present

## 2021-09-29 DIAGNOSIS — H401122 Primary open-angle glaucoma, left eye, moderate stage: Secondary | ICD-10-CM | POA: Diagnosis not present

## 2021-09-29 DIAGNOSIS — H52203 Unspecified astigmatism, bilateral: Secondary | ICD-10-CM | POA: Diagnosis not present

## 2021-09-29 DIAGNOSIS — H401111 Primary open-angle glaucoma, right eye, mild stage: Secondary | ICD-10-CM | POA: Diagnosis not present

## 2021-10-12 DIAGNOSIS — M47818 Spondylosis without myelopathy or radiculopathy, sacral and sacrococcygeal region: Secondary | ICD-10-CM | POA: Diagnosis not present

## 2021-10-12 DIAGNOSIS — M5416 Radiculopathy, lumbar region: Secondary | ICD-10-CM | POA: Diagnosis not present

## 2021-10-12 DIAGNOSIS — M961 Postlaminectomy syndrome, not elsewhere classified: Secondary | ICD-10-CM | POA: Diagnosis not present

## 2021-10-12 DIAGNOSIS — G894 Chronic pain syndrome: Secondary | ICD-10-CM | POA: Diagnosis not present

## 2021-10-29 DIAGNOSIS — L570 Actinic keratosis: Secondary | ICD-10-CM | POA: Diagnosis not present

## 2021-10-29 DIAGNOSIS — D1801 Hemangioma of skin and subcutaneous tissue: Secondary | ICD-10-CM | POA: Diagnosis not present

## 2021-10-29 DIAGNOSIS — C44729 Squamous cell carcinoma of skin of left lower limb, including hip: Secondary | ICD-10-CM | POA: Diagnosis not present

## 2021-10-29 DIAGNOSIS — R229 Localized swelling, mass and lump, unspecified: Secondary | ICD-10-CM | POA: Diagnosis not present

## 2021-10-29 DIAGNOSIS — Z85828 Personal history of other malignant neoplasm of skin: Secondary | ICD-10-CM | POA: Diagnosis not present

## 2021-10-29 DIAGNOSIS — Z08 Encounter for follow-up examination after completed treatment for malignant neoplasm: Secondary | ICD-10-CM | POA: Diagnosis not present

## 2021-10-29 DIAGNOSIS — L821 Other seborrheic keratosis: Secondary | ICD-10-CM | POA: Diagnosis not present

## 2021-11-13 DIAGNOSIS — M4807 Spinal stenosis, lumbosacral region: Secondary | ICD-10-CM | POA: Diagnosis not present

## 2021-11-13 DIAGNOSIS — M48062 Spinal stenosis, lumbar region with neurogenic claudication: Secondary | ICD-10-CM | POA: Diagnosis not present

## 2021-11-26 DIAGNOSIS — M48061 Spinal stenosis, lumbar region without neurogenic claudication: Secondary | ICD-10-CM | POA: Diagnosis not present

## 2021-11-26 DIAGNOSIS — M47816 Spondylosis without myelopathy or radiculopathy, lumbar region: Secondary | ICD-10-CM | POA: Diagnosis not present

## 2021-12-03 DIAGNOSIS — M25562 Pain in left knee: Secondary | ICD-10-CM | POA: Diagnosis not present

## 2021-12-03 DIAGNOSIS — M1712 Unilateral primary osteoarthritis, left knee: Secondary | ICD-10-CM | POA: Diagnosis not present

## 2021-12-14 DIAGNOSIS — M5416 Radiculopathy, lumbar region: Secondary | ICD-10-CM | POA: Diagnosis not present

## 2021-12-14 DIAGNOSIS — M961 Postlaminectomy syndrome, not elsewhere classified: Secondary | ICD-10-CM | POA: Diagnosis not present

## 2021-12-14 DIAGNOSIS — M47818 Spondylosis without myelopathy or radiculopathy, sacral and sacrococcygeal region: Secondary | ICD-10-CM | POA: Diagnosis not present

## 2021-12-14 DIAGNOSIS — G894 Chronic pain syndrome: Secondary | ICD-10-CM | POA: Diagnosis not present

## 2021-12-15 DIAGNOSIS — R278 Other lack of coordination: Secondary | ICD-10-CM | POA: Diagnosis not present

## 2021-12-15 DIAGNOSIS — M4807 Spinal stenosis, lumbosacral region: Secondary | ICD-10-CM | POA: Diagnosis not present

## 2021-12-15 DIAGNOSIS — M6258 Muscle wasting and atrophy, not elsewhere classified, other site: Secondary | ICD-10-CM | POA: Diagnosis not present

## 2021-12-15 DIAGNOSIS — M5459 Other low back pain: Secondary | ICD-10-CM | POA: Diagnosis not present

## 2021-12-15 DIAGNOSIS — M48062 Spinal stenosis, lumbar region with neurogenic claudication: Secondary | ICD-10-CM | POA: Diagnosis not present

## 2021-12-21 DIAGNOSIS — M5459 Other low back pain: Secondary | ICD-10-CM | POA: Diagnosis not present

## 2021-12-21 DIAGNOSIS — M4807 Spinal stenosis, lumbosacral region: Secondary | ICD-10-CM | POA: Diagnosis not present

## 2021-12-21 DIAGNOSIS — M6258 Muscle wasting and atrophy, not elsewhere classified, other site: Secondary | ICD-10-CM | POA: Diagnosis not present

## 2021-12-21 DIAGNOSIS — M48062 Spinal stenosis, lumbar region with neurogenic claudication: Secondary | ICD-10-CM | POA: Diagnosis not present

## 2021-12-21 DIAGNOSIS — R278 Other lack of coordination: Secondary | ICD-10-CM | POA: Diagnosis not present

## 2021-12-23 DIAGNOSIS — M5459 Other low back pain: Secondary | ICD-10-CM | POA: Diagnosis not present

## 2021-12-23 DIAGNOSIS — L4059 Other psoriatic arthropathy: Secondary | ICD-10-CM | POA: Diagnosis not present

## 2021-12-23 DIAGNOSIS — Z79899 Other long term (current) drug therapy: Secondary | ICD-10-CM | POA: Diagnosis not present

## 2021-12-23 DIAGNOSIS — M48062 Spinal stenosis, lumbar region with neurogenic claudication: Secondary | ICD-10-CM | POA: Diagnosis not present

## 2021-12-23 DIAGNOSIS — R278 Other lack of coordination: Secondary | ICD-10-CM | POA: Diagnosis not present

## 2021-12-23 DIAGNOSIS — M6258 Muscle wasting and atrophy, not elsewhere classified, other site: Secondary | ICD-10-CM | POA: Diagnosis not present

## 2021-12-23 DIAGNOSIS — M5136 Other intervertebral disc degeneration, lumbar region: Secondary | ICD-10-CM | POA: Diagnosis not present

## 2021-12-23 DIAGNOSIS — M4807 Spinal stenosis, lumbosacral region: Secondary | ICD-10-CM | POA: Diagnosis not present

## 2021-12-23 DIAGNOSIS — M150 Primary generalized (osteo)arthritis: Secondary | ICD-10-CM | POA: Diagnosis not present

## 2021-12-23 DIAGNOSIS — Z6828 Body mass index (BMI) 28.0-28.9, adult: Secondary | ICD-10-CM | POA: Diagnosis not present

## 2021-12-23 DIAGNOSIS — E663 Overweight: Secondary | ICD-10-CM | POA: Diagnosis not present

## 2021-12-23 DIAGNOSIS — M5030 Other cervical disc degeneration, unspecified cervical region: Secondary | ICD-10-CM | POA: Diagnosis not present

## 2021-12-28 DIAGNOSIS — M5459 Other low back pain: Secondary | ICD-10-CM | POA: Diagnosis not present

## 2021-12-28 DIAGNOSIS — R278 Other lack of coordination: Secondary | ICD-10-CM | POA: Diagnosis not present

## 2021-12-28 DIAGNOSIS — M48062 Spinal stenosis, lumbar region with neurogenic claudication: Secondary | ICD-10-CM | POA: Diagnosis not present

## 2021-12-28 DIAGNOSIS — M6258 Muscle wasting and atrophy, not elsewhere classified, other site: Secondary | ICD-10-CM | POA: Diagnosis not present

## 2021-12-28 DIAGNOSIS — M4807 Spinal stenosis, lumbosacral region: Secondary | ICD-10-CM | POA: Diagnosis not present

## 2021-12-29 DIAGNOSIS — M48062 Spinal stenosis, lumbar region with neurogenic claudication: Secondary | ICD-10-CM | POA: Diagnosis not present

## 2021-12-29 DIAGNOSIS — M4807 Spinal stenosis, lumbosacral region: Secondary | ICD-10-CM | POA: Diagnosis not present

## 2022-01-04 DIAGNOSIS — M5459 Other low back pain: Secondary | ICD-10-CM | POA: Diagnosis not present

## 2022-01-04 DIAGNOSIS — M48062 Spinal stenosis, lumbar region with neurogenic claudication: Secondary | ICD-10-CM | POA: Diagnosis not present

## 2022-01-04 DIAGNOSIS — M4807 Spinal stenosis, lumbosacral region: Secondary | ICD-10-CM | POA: Diagnosis not present

## 2022-01-04 DIAGNOSIS — R278 Other lack of coordination: Secondary | ICD-10-CM | POA: Diagnosis not present

## 2022-01-04 DIAGNOSIS — M6258 Muscle wasting and atrophy, not elsewhere classified, other site: Secondary | ICD-10-CM | POA: Diagnosis not present

## 2022-01-28 ENCOUNTER — Other Ambulatory Visit (HOSPITAL_BASED_OUTPATIENT_CLINIC_OR_DEPARTMENT_OTHER): Payer: Self-pay | Admitting: Nurse Practitioner

## 2022-01-28 DIAGNOSIS — M4807 Spinal stenosis, lumbosacral region: Secondary | ICD-10-CM

## 2022-01-30 DIAGNOSIS — N3000 Acute cystitis without hematuria: Secondary | ICD-10-CM | POA: Diagnosis not present

## 2022-01-30 DIAGNOSIS — R300 Dysuria: Secondary | ICD-10-CM | POA: Diagnosis not present

## 2022-02-08 DIAGNOSIS — M5416 Radiculopathy, lumbar region: Secondary | ICD-10-CM | POA: Diagnosis not present

## 2022-02-08 DIAGNOSIS — M961 Postlaminectomy syndrome, not elsewhere classified: Secondary | ICD-10-CM | POA: Diagnosis not present

## 2022-02-08 DIAGNOSIS — M47818 Spondylosis without myelopathy or radiculopathy, sacral and sacrococcygeal region: Secondary | ICD-10-CM | POA: Diagnosis not present

## 2022-02-08 DIAGNOSIS — G894 Chronic pain syndrome: Secondary | ICD-10-CM | POA: Diagnosis not present

## 2022-02-23 DIAGNOSIS — E039 Hypothyroidism, unspecified: Secondary | ICD-10-CM | POA: Diagnosis not present

## 2022-02-23 DIAGNOSIS — Z23 Encounter for immunization: Secondary | ICD-10-CM | POA: Diagnosis not present

## 2022-02-23 DIAGNOSIS — E785 Hyperlipidemia, unspecified: Secondary | ICD-10-CM | POA: Diagnosis not present

## 2022-02-23 DIAGNOSIS — M810 Age-related osteoporosis without current pathological fracture: Secondary | ICD-10-CM | POA: Diagnosis not present

## 2022-02-23 DIAGNOSIS — I10 Essential (primary) hypertension: Secondary | ICD-10-CM | POA: Diagnosis not present

## 2022-03-08 DIAGNOSIS — Z1331 Encounter for screening for depression: Secondary | ICD-10-CM | POA: Diagnosis not present

## 2022-03-08 DIAGNOSIS — Z1389 Encounter for screening for other disorder: Secondary | ICD-10-CM | POA: Diagnosis not present

## 2022-03-08 DIAGNOSIS — Z0000 Encounter for general adult medical examination without abnormal findings: Secondary | ICD-10-CM | POA: Diagnosis not present

## 2022-03-08 DIAGNOSIS — M4800 Spinal stenosis, site unspecified: Secondary | ICD-10-CM | POA: Diagnosis not present

## 2022-03-08 DIAGNOSIS — I10 Essential (primary) hypertension: Secondary | ICD-10-CM | POA: Diagnosis not present

## 2022-03-08 DIAGNOSIS — M791 Myalgia, unspecified site: Secondary | ICD-10-CM | POA: Diagnosis not present

## 2022-03-08 DIAGNOSIS — F3342 Major depressive disorder, recurrent, in full remission: Secondary | ICD-10-CM | POA: Diagnosis not present

## 2022-03-08 DIAGNOSIS — E039 Hypothyroidism, unspecified: Secondary | ICD-10-CM | POA: Diagnosis not present

## 2022-03-08 DIAGNOSIS — I700 Atherosclerosis of aorta: Secondary | ICD-10-CM | POA: Diagnosis not present

## 2022-03-08 DIAGNOSIS — E785 Hyperlipidemia, unspecified: Secondary | ICD-10-CM | POA: Diagnosis not present

## 2022-03-08 DIAGNOSIS — K219 Gastro-esophageal reflux disease without esophagitis: Secondary | ICD-10-CM | POA: Diagnosis not present

## 2022-03-08 DIAGNOSIS — M810 Age-related osteoporosis without current pathological fracture: Secondary | ICD-10-CM | POA: Diagnosis not present

## 2022-03-08 DIAGNOSIS — N3010 Interstitial cystitis (chronic) without hematuria: Secondary | ICD-10-CM | POA: Diagnosis not present

## 2022-03-08 DIAGNOSIS — J45909 Unspecified asthma, uncomplicated: Secondary | ICD-10-CM | POA: Diagnosis not present

## 2022-03-23 ENCOUNTER — Other Ambulatory Visit (HOSPITAL_COMMUNITY): Payer: Self-pay

## 2022-03-24 ENCOUNTER — Ambulatory Visit (HOSPITAL_COMMUNITY)
Admission: RE | Admit: 2022-03-24 | Discharge: 2022-03-24 | Disposition: A | Discharge status: Home / Self Care | Payer: Medicare Other | Source: Ambulatory Visit | Attending: Internal Medicine | Admitting: Internal Medicine

## 2022-03-24 DIAGNOSIS — M810 Age-related osteoporosis without current pathological fracture: Secondary | ICD-10-CM | POA: Diagnosis not present

## 2022-03-24 MED ORDER — ZOLEDRONIC ACID 5 MG/100ML IV SOLN
INTRAVENOUS | Status: AC
  Filled 2022-03-24: qty 100

## 2022-03-24 MED ORDER — ZOLEDRONIC ACID 5 MG/100ML IV SOLN
5.0000 mg | Freq: Once | INTRAVENOUS | Status: AC
  Administered 2022-03-24: 5 mg via INTRAVENOUS

## 2022-03-26 DIAGNOSIS — S76011A Strain of muscle, fascia and tendon of right hip, initial encounter: Secondary | ICD-10-CM | POA: Diagnosis not present

## 2022-03-26 DIAGNOSIS — M545 Low back pain, unspecified: Secondary | ICD-10-CM | POA: Diagnosis not present

## 2022-03-31 DIAGNOSIS — H401122 Primary open-angle glaucoma, left eye, moderate stage: Secondary | ICD-10-CM | POA: Diagnosis not present

## 2022-04-01 DIAGNOSIS — E663 Overweight: Secondary | ICD-10-CM | POA: Diagnosis not present

## 2022-04-01 DIAGNOSIS — L4059 Other psoriatic arthropathy: Secondary | ICD-10-CM | POA: Diagnosis not present

## 2022-04-01 DIAGNOSIS — Z6829 Body mass index (BMI) 29.0-29.9, adult: Secondary | ICD-10-CM | POA: Diagnosis not present

## 2022-04-01 DIAGNOSIS — M5136 Other intervertebral disc degeneration, lumbar region: Secondary | ICD-10-CM | POA: Diagnosis not present

## 2022-04-01 DIAGNOSIS — Z79899 Other long term (current) drug therapy: Secondary | ICD-10-CM | POA: Diagnosis not present

## 2022-04-01 DIAGNOSIS — M5030 Other cervical disc degeneration, unspecified cervical region: Secondary | ICD-10-CM | POA: Diagnosis not present

## 2022-04-01 DIAGNOSIS — M1991 Primary osteoarthritis, unspecified site: Secondary | ICD-10-CM | POA: Diagnosis not present

## 2022-04-05 DIAGNOSIS — M961 Postlaminectomy syndrome, not elsewhere classified: Secondary | ICD-10-CM | POA: Diagnosis not present

## 2022-04-05 DIAGNOSIS — M25551 Pain in right hip: Secondary | ICD-10-CM | POA: Diagnosis not present

## 2022-04-05 DIAGNOSIS — G894 Chronic pain syndrome: Secondary | ICD-10-CM | POA: Diagnosis not present

## 2022-04-05 DIAGNOSIS — M47818 Spondylosis without myelopathy or radiculopathy, sacral and sacrococcygeal region: Secondary | ICD-10-CM | POA: Diagnosis not present

## 2022-04-16 ENCOUNTER — Other Ambulatory Visit: Payer: Self-pay | Admitting: Internal Medicine

## 2022-04-16 DIAGNOSIS — M545 Low back pain, unspecified: Secondary | ICD-10-CM | POA: Diagnosis not present

## 2022-04-16 DIAGNOSIS — Z1231 Encounter for screening mammogram for malignant neoplasm of breast: Secondary | ICD-10-CM

## 2022-04-21 DIAGNOSIS — M4807 Spinal stenosis, lumbosacral region: Secondary | ICD-10-CM | POA: Diagnosis not present

## 2022-04-21 DIAGNOSIS — M5187 Other intervertebral disc disorders, lumbosacral region: Secondary | ICD-10-CM | POA: Diagnosis not present

## 2022-04-21 DIAGNOSIS — M25551 Pain in right hip: Secondary | ICD-10-CM | POA: Diagnosis not present

## 2022-04-21 DIAGNOSIS — R531 Weakness: Secondary | ICD-10-CM | POA: Diagnosis not present

## 2022-04-21 DIAGNOSIS — Z981 Arthrodesis status: Secondary | ICD-10-CM | POA: Diagnosis not present

## 2022-04-21 DIAGNOSIS — M545 Low back pain, unspecified: Secondary | ICD-10-CM | POA: Diagnosis not present

## 2022-04-26 DIAGNOSIS — M5416 Radiculopathy, lumbar region: Secondary | ICD-10-CM | POA: Diagnosis not present

## 2022-04-26 DIAGNOSIS — M545 Low back pain, unspecified: Secondary | ICD-10-CM | POA: Diagnosis not present

## 2022-04-27 ENCOUNTER — Other Ambulatory Visit: Payer: Self-pay | Admitting: Cardiology

## 2022-05-11 DIAGNOSIS — R102 Pelvic and perineal pain: Secondary | ICD-10-CM | POA: Diagnosis not present

## 2022-05-11 DIAGNOSIS — N3010 Interstitial cystitis (chronic) without hematuria: Secondary | ICD-10-CM | POA: Diagnosis not present

## 2022-05-17 DIAGNOSIS — M25551 Pain in right hip: Secondary | ICD-10-CM | POA: Diagnosis not present

## 2022-05-17 DIAGNOSIS — M47818 Spondylosis without myelopathy or radiculopathy, sacral and sacrococcygeal region: Secondary | ICD-10-CM | POA: Diagnosis not present

## 2022-05-17 DIAGNOSIS — G894 Chronic pain syndrome: Secondary | ICD-10-CM | POA: Diagnosis not present

## 2022-05-17 DIAGNOSIS — M961 Postlaminectomy syndrome, not elsewhere classified: Secondary | ICD-10-CM | POA: Diagnosis not present

## 2022-05-18 ENCOUNTER — Ambulatory Visit
Admission: RE | Admit: 2022-05-18 | Discharge: 2022-05-18 | Disposition: A | Discharge status: Home / Self Care | Payer: Medicare Other | Source: Ambulatory Visit | Attending: Internal Medicine | Admitting: Internal Medicine

## 2022-05-18 DIAGNOSIS — Z1231 Encounter for screening mammogram for malignant neoplasm of breast: Secondary | ICD-10-CM | POA: Diagnosis not present

## 2022-05-21 DIAGNOSIS — M48062 Spinal stenosis, lumbar region with neurogenic claudication: Secondary | ICD-10-CM | POA: Diagnosis not present

## 2022-05-21 DIAGNOSIS — M5416 Radiculopathy, lumbar region: Secondary | ICD-10-CM | POA: Diagnosis not present

## 2022-05-21 DIAGNOSIS — M4802 Spinal stenosis, cervical region: Secondary | ICD-10-CM | POA: Diagnosis not present

## 2022-06-02 DIAGNOSIS — M50223 Other cervical disc displacement at C6-C7 level: Secondary | ICD-10-CM | POA: Diagnosis not present

## 2022-06-02 DIAGNOSIS — M5134 Other intervertebral disc degeneration, thoracic region: Secondary | ICD-10-CM | POA: Diagnosis not present

## 2022-06-02 DIAGNOSIS — Z981 Arthrodesis status: Secondary | ICD-10-CM | POA: Diagnosis not present

## 2022-06-02 DIAGNOSIS — M4802 Spinal stenosis, cervical region: Secondary | ICD-10-CM | POA: Diagnosis not present

## 2022-06-09 DIAGNOSIS — L4059 Other psoriatic arthropathy: Secondary | ICD-10-CM | POA: Diagnosis not present

## 2022-06-15 DIAGNOSIS — M5459 Other low back pain: Secondary | ICD-10-CM | POA: Diagnosis not present

## 2022-06-15 DIAGNOSIS — R2689 Other abnormalities of gait and mobility: Secondary | ICD-10-CM | POA: Diagnosis not present

## 2022-06-15 DIAGNOSIS — Z9181 History of falling: Secondary | ICD-10-CM | POA: Diagnosis not present

## 2022-06-15 DIAGNOSIS — R278 Other lack of coordination: Secondary | ICD-10-CM | POA: Diagnosis not present

## 2022-06-15 DIAGNOSIS — M5441 Lumbago with sciatica, right side: Secondary | ICD-10-CM | POA: Diagnosis not present

## 2022-06-15 DIAGNOSIS — M5416 Radiculopathy, lumbar region: Secondary | ICD-10-CM | POA: Diagnosis not present

## 2022-06-15 DIAGNOSIS — M62561 Muscle wasting and atrophy, not elsewhere classified, right lower leg: Secondary | ICD-10-CM | POA: Diagnosis not present

## 2022-06-16 DIAGNOSIS — M5441 Lumbago with sciatica, right side: Secondary | ICD-10-CM | POA: Diagnosis not present

## 2022-06-16 DIAGNOSIS — M5416 Radiculopathy, lumbar region: Secondary | ICD-10-CM | POA: Diagnosis not present

## 2022-06-16 DIAGNOSIS — M5459 Other low back pain: Secondary | ICD-10-CM | POA: Diagnosis not present

## 2022-06-16 DIAGNOSIS — R278 Other lack of coordination: Secondary | ICD-10-CM | POA: Diagnosis not present

## 2022-06-16 DIAGNOSIS — M62561 Muscle wasting and atrophy, not elsewhere classified, right lower leg: Secondary | ICD-10-CM | POA: Diagnosis not present

## 2022-06-16 DIAGNOSIS — Z9181 History of falling: Secondary | ICD-10-CM | POA: Diagnosis not present

## 2022-06-16 DIAGNOSIS — R2689 Other abnormalities of gait and mobility: Secondary | ICD-10-CM | POA: Diagnosis not present

## 2022-06-21 DIAGNOSIS — R2689 Other abnormalities of gait and mobility: Secondary | ICD-10-CM | POA: Diagnosis not present

## 2022-06-21 DIAGNOSIS — Z9181 History of falling: Secondary | ICD-10-CM | POA: Diagnosis not present

## 2022-06-21 DIAGNOSIS — M5441 Lumbago with sciatica, right side: Secondary | ICD-10-CM | POA: Diagnosis not present

## 2022-06-21 DIAGNOSIS — M5416 Radiculopathy, lumbar region: Secondary | ICD-10-CM | POA: Diagnosis not present

## 2022-06-21 DIAGNOSIS — M5459 Other low back pain: Secondary | ICD-10-CM | POA: Diagnosis not present

## 2022-06-21 DIAGNOSIS — M62561 Muscle wasting and atrophy, not elsewhere classified, right lower leg: Secondary | ICD-10-CM | POA: Diagnosis not present

## 2022-06-21 DIAGNOSIS — R278 Other lack of coordination: Secondary | ICD-10-CM | POA: Diagnosis not present

## 2022-06-24 DIAGNOSIS — M5416 Radiculopathy, lumbar region: Secondary | ICD-10-CM | POA: Diagnosis not present

## 2022-06-24 DIAGNOSIS — M5441 Lumbago with sciatica, right side: Secondary | ICD-10-CM | POA: Diagnosis not present

## 2022-06-24 DIAGNOSIS — Z9181 History of falling: Secondary | ICD-10-CM | POA: Diagnosis not present

## 2022-06-24 DIAGNOSIS — M5459 Other low back pain: Secondary | ICD-10-CM | POA: Diagnosis not present

## 2022-06-24 DIAGNOSIS — M62561 Muscle wasting and atrophy, not elsewhere classified, right lower leg: Secondary | ICD-10-CM | POA: Diagnosis not present

## 2022-06-24 DIAGNOSIS — R278 Other lack of coordination: Secondary | ICD-10-CM | POA: Diagnosis not present

## 2022-06-24 DIAGNOSIS — R2689 Other abnormalities of gait and mobility: Secondary | ICD-10-CM | POA: Diagnosis not present

## 2022-06-28 DIAGNOSIS — M5441 Lumbago with sciatica, right side: Secondary | ICD-10-CM | POA: Diagnosis not present

## 2022-06-28 DIAGNOSIS — Z9181 History of falling: Secondary | ICD-10-CM | POA: Diagnosis not present

## 2022-06-28 DIAGNOSIS — M5459 Other low back pain: Secondary | ICD-10-CM | POA: Diagnosis not present

## 2022-06-28 DIAGNOSIS — R278 Other lack of coordination: Secondary | ICD-10-CM | POA: Diagnosis not present

## 2022-06-28 DIAGNOSIS — R2689 Other abnormalities of gait and mobility: Secondary | ICD-10-CM | POA: Diagnosis not present

## 2022-06-28 DIAGNOSIS — M62561 Muscle wasting and atrophy, not elsewhere classified, right lower leg: Secondary | ICD-10-CM | POA: Diagnosis not present

## 2022-06-28 DIAGNOSIS — M5416 Radiculopathy, lumbar region: Secondary | ICD-10-CM | POA: Diagnosis not present

## 2022-07-01 DIAGNOSIS — R2689 Other abnormalities of gait and mobility: Secondary | ICD-10-CM | POA: Diagnosis not present

## 2022-07-01 DIAGNOSIS — Z9181 History of falling: Secondary | ICD-10-CM | POA: Diagnosis not present

## 2022-07-01 DIAGNOSIS — M5416 Radiculopathy, lumbar region: Secondary | ICD-10-CM | POA: Diagnosis not present

## 2022-07-01 DIAGNOSIS — R278 Other lack of coordination: Secondary | ICD-10-CM | POA: Diagnosis not present

## 2022-07-01 DIAGNOSIS — M5441 Lumbago with sciatica, right side: Secondary | ICD-10-CM | POA: Diagnosis not present

## 2022-07-01 DIAGNOSIS — M62561 Muscle wasting and atrophy, not elsewhere classified, right lower leg: Secondary | ICD-10-CM | POA: Diagnosis not present

## 2022-07-01 DIAGNOSIS — M5459 Other low back pain: Secondary | ICD-10-CM | POA: Diagnosis not present

## 2022-07-12 DIAGNOSIS — M47818 Spondylosis without myelopathy or radiculopathy, sacral and sacrococcygeal region: Secondary | ICD-10-CM | POA: Diagnosis not present

## 2022-07-12 DIAGNOSIS — G894 Chronic pain syndrome: Secondary | ICD-10-CM | POA: Diagnosis not present

## 2022-07-12 DIAGNOSIS — M25551 Pain in right hip: Secondary | ICD-10-CM | POA: Diagnosis not present

## 2022-07-12 DIAGNOSIS — M961 Postlaminectomy syndrome, not elsewhere classified: Secondary | ICD-10-CM | POA: Diagnosis not present

## 2022-07-13 DIAGNOSIS — R278 Other lack of coordination: Secondary | ICD-10-CM | POA: Diagnosis not present

## 2022-07-13 DIAGNOSIS — M5416 Radiculopathy, lumbar region: Secondary | ICD-10-CM | POA: Diagnosis not present

## 2022-07-13 DIAGNOSIS — M5459 Other low back pain: Secondary | ICD-10-CM | POA: Diagnosis not present

## 2022-07-13 DIAGNOSIS — R2689 Other abnormalities of gait and mobility: Secondary | ICD-10-CM | POA: Diagnosis not present

## 2022-07-13 DIAGNOSIS — M5441 Lumbago with sciatica, right side: Secondary | ICD-10-CM | POA: Diagnosis not present

## 2022-07-13 DIAGNOSIS — M62561 Muscle wasting and atrophy, not elsewhere classified, right lower leg: Secondary | ICD-10-CM | POA: Diagnosis not present

## 2022-07-13 DIAGNOSIS — Z9181 History of falling: Secondary | ICD-10-CM | POA: Diagnosis not present

## 2022-07-15 DIAGNOSIS — Z981 Arthrodesis status: Secondary | ICD-10-CM | POA: Diagnosis not present

## 2022-07-15 DIAGNOSIS — M5116 Intervertebral disc disorders with radiculopathy, lumbar region: Secondary | ICD-10-CM | POA: Diagnosis not present

## 2022-07-15 DIAGNOSIS — M48062 Spinal stenosis, lumbar region with neurogenic claudication: Secondary | ICD-10-CM | POA: Diagnosis not present

## 2022-07-27 DIAGNOSIS — N390 Urinary tract infection, site not specified: Secondary | ICD-10-CM | POA: Diagnosis not present

## 2022-08-12 DIAGNOSIS — Z79899 Other long term (current) drug therapy: Secondary | ICD-10-CM | POA: Diagnosis not present

## 2022-08-12 DIAGNOSIS — L4050 Arthropathic psoriasis, unspecified: Secondary | ICD-10-CM | POA: Diagnosis not present

## 2022-08-12 DIAGNOSIS — Z79631 Long term (current) use of antimetabolite agent: Secondary | ICD-10-CM | POA: Diagnosis not present

## 2022-08-12 DIAGNOSIS — L409 Psoriasis, unspecified: Secondary | ICD-10-CM | POA: Diagnosis not present

## 2022-08-28 DIAGNOSIS — Z23 Encounter for immunization: Secondary | ICD-10-CM | POA: Diagnosis not present

## 2022-09-06 DIAGNOSIS — G894 Chronic pain syndrome: Secondary | ICD-10-CM | POA: Diagnosis not present

## 2022-09-06 DIAGNOSIS — M47818 Spondylosis without myelopathy or radiculopathy, sacral and sacrococcygeal region: Secondary | ICD-10-CM | POA: Diagnosis not present

## 2022-09-06 DIAGNOSIS — Z79891 Long term (current) use of opiate analgesic: Secondary | ICD-10-CM | POA: Diagnosis not present

## 2022-09-06 DIAGNOSIS — M25551 Pain in right hip: Secondary | ICD-10-CM | POA: Diagnosis not present

## 2022-09-06 DIAGNOSIS — M961 Postlaminectomy syndrome, not elsewhere classified: Secondary | ICD-10-CM | POA: Diagnosis not present

## 2022-09-20 ENCOUNTER — Other Ambulatory Visit (HOSPITAL_BASED_OUTPATIENT_CLINIC_OR_DEPARTMENT_OTHER): Payer: Self-pay

## 2022-09-20 DIAGNOSIS — M5136 Other intervertebral disc degeneration, lumbar region: Secondary | ICD-10-CM | POA: Diagnosis not present

## 2022-09-20 DIAGNOSIS — Z6831 Body mass index (BMI) 31.0-31.9, adult: Secondary | ICD-10-CM | POA: Diagnosis not present

## 2022-09-20 DIAGNOSIS — L4059 Other psoriatic arthropathy: Secondary | ICD-10-CM | POA: Diagnosis not present

## 2022-09-20 DIAGNOSIS — E669 Obesity, unspecified: Secondary | ICD-10-CM | POA: Diagnosis not present

## 2022-09-20 DIAGNOSIS — M5030 Other cervical disc degeneration, unspecified cervical region: Secondary | ICD-10-CM | POA: Diagnosis not present

## 2022-09-20 DIAGNOSIS — M1991 Primary osteoarthritis, unspecified site: Secondary | ICD-10-CM | POA: Diagnosis not present

## 2022-09-20 DIAGNOSIS — Z79899 Other long term (current) drug therapy: Secondary | ICD-10-CM | POA: Diagnosis not present

## 2022-09-20 MED ORDER — OXYCODONE-ACETAMINOPHEN 10-325 MG PO TABS
1.0000 | ORAL_TABLET | Freq: Four times a day (QID) | ORAL | 0 refills | Status: DC | PRN
  Filled 2022-09-20: qty 120, 30d supply, fill #0

## 2022-09-22 DIAGNOSIS — Z23 Encounter for immunization: Secondary | ICD-10-CM | POA: Diagnosis not present

## 2022-09-28 ENCOUNTER — Other Ambulatory Visit (HOSPITAL_BASED_OUTPATIENT_CLINIC_OR_DEPARTMENT_OTHER): Payer: Self-pay

## 2022-09-28 DIAGNOSIS — E039 Hypothyroidism, unspecified: Secondary | ICD-10-CM | POA: Diagnosis not present

## 2022-09-28 DIAGNOSIS — R635 Abnormal weight gain: Secondary | ICD-10-CM | POA: Diagnosis not present

## 2022-09-28 DIAGNOSIS — R5383 Other fatigue: Secondary | ICD-10-CM | POA: Diagnosis not present

## 2022-09-28 DIAGNOSIS — M4800 Spinal stenosis, site unspecified: Secondary | ICD-10-CM | POA: Diagnosis not present

## 2022-09-28 DIAGNOSIS — N644 Mastodynia: Secondary | ICD-10-CM | POA: Diagnosis not present

## 2022-09-28 DIAGNOSIS — M797 Fibromyalgia: Secondary | ICD-10-CM | POA: Diagnosis not present

## 2022-09-28 MED ORDER — DULOXETINE HCL 60 MG PO CPEP
60.0000 mg | ORAL_CAPSULE | Freq: Every day | ORAL | 11 refills | Status: DC
  Filled 2022-09-28: qty 30, 30d supply, fill #0
  Filled 2022-11-08: qty 30, 30d supply, fill #1
  Filled 2023-01-16: qty 30, 30d supply, fill #2
  Filled 2023-03-14: qty 30, 30d supply, fill #3
  Filled 2023-04-12: qty 30, 30d supply, fill #4
  Filled 2023-05-12: qty 30, 30d supply, fill #5
  Filled 2023-06-09: qty 30, 30d supply, fill #6
  Filled 2023-07-10: qty 30, 30d supply, fill #7
  Filled 2023-08-14: qty 30, 30d supply, fill #8
  Filled 2023-09-12: qty 30, 30d supply, fill #9

## 2022-09-28 MED ORDER — DULOXETINE HCL 30 MG PO CPEP
30.0000 mg | ORAL_CAPSULE | Freq: Every day | ORAL | 0 refills | Status: DC
  Filled 2022-09-28: qty 7, 7d supply, fill #0

## 2022-10-16 ENCOUNTER — Emergency Department (HOSPITAL_BASED_OUTPATIENT_CLINIC_OR_DEPARTMENT_OTHER)
Admission: EM | Admit: 2022-10-16 | Discharge: 2022-10-16 | Discharge status: Against Medical Advice | Payer: Medicare Other | Attending: Emergency Medicine | Admitting: Emergency Medicine

## 2022-10-16 ENCOUNTER — Other Ambulatory Visit: Payer: Self-pay

## 2022-10-16 DIAGNOSIS — R197 Diarrhea, unspecified: Secondary | ICD-10-CM | POA: Insufficient documentation

## 2022-10-16 DIAGNOSIS — R109 Unspecified abdominal pain: Secondary | ICD-10-CM | POA: Insufficient documentation

## 2022-10-16 DIAGNOSIS — Z5321 Procedure and treatment not carried out due to patient leaving prior to being seen by health care provider: Secondary | ICD-10-CM | POA: Diagnosis not present

## 2022-10-16 LAB — COMPREHENSIVE METABOLIC PANEL WITH GFR
ALT: 13 U/L (ref 0–44)
AST: 14 U/L — ABNORMAL LOW (ref 15–41)
Albumin: 4.4 g/dL (ref 3.5–5.0)
Alkaline Phosphatase: 68 U/L (ref 38–126)
Anion gap: 11 (ref 5–15)
BUN: 20 mg/dL (ref 8–23)
CO2: 27 mmol/L (ref 22–32)
Calcium: 9.3 mg/dL (ref 8.9–10.3)
Chloride: 102 mmol/L (ref 98–111)
Creatinine, Ser: 1 mg/dL (ref 0.44–1.00)
GFR, Estimated: 59 mL/min — ABNORMAL LOW
Glucose, Bld: 91 mg/dL (ref 70–99)
Potassium: 3.7 mmol/L (ref 3.5–5.1)
Sodium: 140 mmol/L (ref 135–145)
Total Bilirubin: 0.3 mg/dL (ref 0.3–1.2)
Total Protein: 7.2 g/dL (ref 6.5–8.1)

## 2022-10-16 LAB — CBC
HCT: 43 % (ref 36.0–46.0)
Hemoglobin: 13.7 g/dL (ref 12.0–15.0)
MCH: 30.3 pg (ref 26.0–34.0)
MCHC: 31.9 g/dL (ref 30.0–36.0)
MCV: 95.1 fL (ref 80.0–100.0)
Platelets: 331 10*3/uL (ref 150–400)
RBC: 4.52 MIL/uL (ref 3.87–5.11)
RDW: 14.6 % (ref 11.5–15.5)
WBC: 9.1 10*3/uL (ref 4.0–10.5)
nRBC: 0 % (ref 0.0–0.2)

## 2022-10-16 LAB — URINALYSIS, ROUTINE W REFLEX MICROSCOPIC
Bilirubin Urine: NEGATIVE
Glucose, UA: NEGATIVE mg/dL
Hgb urine dipstick: NEGATIVE
Ketones, ur: NEGATIVE mg/dL
Leukocytes,Ua: NEGATIVE
Nitrite: NEGATIVE
Protein, ur: NEGATIVE mg/dL
Specific Gravity, Urine: 1.012 (ref 1.005–1.030)
pH: 5 (ref 5.0–8.0)

## 2022-10-16 LAB — LIPASE, BLOOD: Lipase: 14 U/L (ref 11–51)

## 2022-10-16 NOTE — ED Triage Notes (Signed)
Patient arrives with complaints of worsening abdominal pain x3 days. Patient also reports a few occurrences of loose stool. Rates abdominal pain a 7/10.Marland Kitchen  No nausea or vomiting.

## 2022-11-01 ENCOUNTER — Other Ambulatory Visit (HOSPITAL_BASED_OUTPATIENT_CLINIC_OR_DEPARTMENT_OTHER): Payer: Self-pay

## 2022-11-01 DIAGNOSIS — H52203 Unspecified astigmatism, bilateral: Secondary | ICD-10-CM | POA: Diagnosis not present

## 2022-11-01 DIAGNOSIS — H353131 Nonexudative age-related macular degeneration, bilateral, early dry stage: Secondary | ICD-10-CM | POA: Diagnosis not present

## 2022-11-01 DIAGNOSIS — D3132 Benign neoplasm of left choroid: Secondary | ICD-10-CM | POA: Diagnosis not present

## 2022-11-01 DIAGNOSIS — L570 Actinic keratosis: Secondary | ICD-10-CM | POA: Diagnosis not present

## 2022-11-01 DIAGNOSIS — H401122 Primary open-angle glaucoma, left eye, moderate stage: Secondary | ICD-10-CM | POA: Diagnosis not present

## 2022-11-01 DIAGNOSIS — Z961 Presence of intraocular lens: Secondary | ICD-10-CM | POA: Diagnosis not present

## 2022-11-01 DIAGNOSIS — L821 Other seborrheic keratosis: Secondary | ICD-10-CM | POA: Diagnosis not present

## 2022-11-01 DIAGNOSIS — Z85828 Personal history of other malignant neoplasm of skin: Secondary | ICD-10-CM | POA: Diagnosis not present

## 2022-11-01 DIAGNOSIS — D692 Other nonthrombocytopenic purpura: Secondary | ICD-10-CM | POA: Diagnosis not present

## 2022-11-01 DIAGNOSIS — R229 Localized swelling, mass and lump, unspecified: Secondary | ICD-10-CM | POA: Diagnosis not present

## 2022-11-01 DIAGNOSIS — C44321 Squamous cell carcinoma of skin of nose: Secondary | ICD-10-CM | POA: Diagnosis not present

## 2022-11-01 DIAGNOSIS — H401111 Primary open-angle glaucoma, right eye, mild stage: Secondary | ICD-10-CM | POA: Diagnosis not present

## 2022-11-01 DIAGNOSIS — Z08 Encounter for follow-up examination after completed treatment for malignant neoplasm: Secondary | ICD-10-CM | POA: Diagnosis not present

## 2022-11-01 DIAGNOSIS — L720 Epidermal cyst: Secondary | ICD-10-CM | POA: Diagnosis not present

## 2022-11-01 MED ORDER — OXYCODONE-ACETAMINOPHEN 10-325 MG PO TABS
1.0000 | ORAL_TABLET | Freq: Four times a day (QID) | ORAL | 0 refills | Status: DC | PRN
  Filled 2022-11-01: qty 120, 30d supply, fill #0

## 2022-11-02 DIAGNOSIS — M961 Postlaminectomy syndrome, not elsewhere classified: Secondary | ICD-10-CM | POA: Diagnosis not present

## 2022-11-02 DIAGNOSIS — G894 Chronic pain syndrome: Secondary | ICD-10-CM | POA: Diagnosis not present

## 2022-11-02 DIAGNOSIS — M47818 Spondylosis without myelopathy or radiculopathy, sacral and sacrococcygeal region: Secondary | ICD-10-CM | POA: Diagnosis not present

## 2022-11-02 DIAGNOSIS — M5416 Radiculopathy, lumbar region: Secondary | ICD-10-CM | POA: Diagnosis not present

## 2022-11-04 ENCOUNTER — Other Ambulatory Visit (HOSPITAL_BASED_OUTPATIENT_CLINIC_OR_DEPARTMENT_OTHER): Payer: Self-pay

## 2022-11-10 DIAGNOSIS — E038 Other specified hypothyroidism: Secondary | ICD-10-CM | POA: Diagnosis not present

## 2022-11-10 DIAGNOSIS — M5416 Radiculopathy, lumbar region: Secondary | ICD-10-CM | POA: Diagnosis not present

## 2022-11-10 DIAGNOSIS — Z01818 Encounter for other preprocedural examination: Secondary | ICD-10-CM | POA: Diagnosis not present

## 2022-11-10 DIAGNOSIS — M4807 Spinal stenosis, lumbosacral region: Secondary | ICD-10-CM | POA: Diagnosis not present

## 2022-11-10 DIAGNOSIS — J45909 Unspecified asthma, uncomplicated: Secondary | ICD-10-CM | POA: Diagnosis not present

## 2022-11-10 DIAGNOSIS — M48062 Spinal stenosis, lumbar region with neurogenic claudication: Secondary | ICD-10-CM | POA: Diagnosis not present

## 2022-11-10 DIAGNOSIS — K219 Gastro-esophageal reflux disease without esophagitis: Secondary | ICD-10-CM | POA: Diagnosis not present

## 2022-11-11 ENCOUNTER — Other Ambulatory Visit: Payer: Self-pay | Admitting: Nurse Practitioner

## 2022-11-11 DIAGNOSIS — M5416 Radiculopathy, lumbar region: Secondary | ICD-10-CM

## 2022-11-11 DIAGNOSIS — M4807 Spinal stenosis, lumbosacral region: Secondary | ICD-10-CM

## 2022-11-12 ENCOUNTER — Ambulatory Visit (HOSPITAL_BASED_OUTPATIENT_CLINIC_OR_DEPARTMENT_OTHER)
Admission: RE | Admit: 2022-11-12 | Discharge: 2022-11-12 | Disposition: A | Discharge status: Home / Self Care | Payer: Medicare Other | Source: Ambulatory Visit | Attending: Nurse Practitioner | Admitting: Nurse Practitioner

## 2022-11-12 DIAGNOSIS — M4807 Spinal stenosis, lumbosacral region: Secondary | ICD-10-CM | POA: Insufficient documentation

## 2022-11-12 DIAGNOSIS — M5416 Radiculopathy, lumbar region: Secondary | ICD-10-CM | POA: Diagnosis not present

## 2022-11-12 DIAGNOSIS — M8588 Other specified disorders of bone density and structure, other site: Secondary | ICD-10-CM | POA: Diagnosis not present

## 2022-11-18 DIAGNOSIS — M25852 Other specified joint disorders, left hip: Secondary | ICD-10-CM | POA: Diagnosis not present

## 2022-11-18 DIAGNOSIS — Z981 Arthrodesis status: Secondary | ICD-10-CM | POA: Diagnosis not present

## 2022-11-18 DIAGNOSIS — Z01818 Encounter for other preprocedural examination: Secondary | ICD-10-CM | POA: Diagnosis not present

## 2022-11-18 DIAGNOSIS — M48062 Spinal stenosis, lumbar region with neurogenic claudication: Secondary | ICD-10-CM | POA: Diagnosis not present

## 2022-11-18 DIAGNOSIS — M5030 Other cervical disc degeneration, unspecified cervical region: Secondary | ICD-10-CM | POA: Diagnosis not present

## 2022-11-18 DIAGNOSIS — M5116 Intervertebral disc disorders with radiculopathy, lumbar region: Secondary | ICD-10-CM | POA: Diagnosis not present

## 2022-11-18 DIAGNOSIS — M5134 Other intervertebral disc degeneration, thoracic region: Secondary | ICD-10-CM | POA: Diagnosis not present

## 2022-11-18 DIAGNOSIS — M4726 Other spondylosis with radiculopathy, lumbar region: Secondary | ICD-10-CM | POA: Diagnosis not present

## 2022-11-18 DIAGNOSIS — M25851 Other specified joint disorders, right hip: Secondary | ICD-10-CM | POA: Diagnosis not present

## 2022-11-25 DIAGNOSIS — R140 Abdominal distension (gaseous): Secondary | ICD-10-CM | POA: Diagnosis not present

## 2022-11-25 DIAGNOSIS — Z882 Allergy status to sulfonamides status: Secondary | ICD-10-CM | POA: Diagnosis not present

## 2022-11-25 DIAGNOSIS — K219 Gastro-esophageal reflux disease without esophagitis: Secondary | ICD-10-CM | POA: Diagnosis not present

## 2022-11-25 DIAGNOSIS — Z981 Arthrodesis status: Secondary | ICD-10-CM | POA: Diagnosis not present

## 2022-11-25 DIAGNOSIS — M5416 Radiculopathy, lumbar region: Secondary | ICD-10-CM | POA: Diagnosis not present

## 2022-11-25 DIAGNOSIS — K567 Ileus, unspecified: Secondary | ICD-10-CM | POA: Diagnosis not present

## 2022-11-25 DIAGNOSIS — E039 Hypothyroidism, unspecified: Secondary | ICD-10-CM | POA: Diagnosis not present

## 2022-11-25 DIAGNOSIS — Z9049 Acquired absence of other specified parts of digestive tract: Secondary | ICD-10-CM | POA: Diagnosis not present

## 2022-11-25 DIAGNOSIS — I10 Essential (primary) hypertension: Secondary | ICD-10-CM | POA: Diagnosis not present

## 2022-11-25 DIAGNOSIS — J45909 Unspecified asthma, uncomplicated: Secondary | ICD-10-CM | POA: Diagnosis not present

## 2022-11-25 DIAGNOSIS — H409 Unspecified glaucoma: Secondary | ICD-10-CM | POA: Diagnosis not present

## 2022-11-25 DIAGNOSIS — Z90710 Acquired absence of both cervix and uterus: Secondary | ICD-10-CM | POA: Diagnosis not present

## 2022-11-25 DIAGNOSIS — Z886 Allergy status to analgesic agent status: Secondary | ICD-10-CM | POA: Diagnosis not present

## 2022-11-25 HISTORY — PX: SPINAL FUSION: SHX223

## 2022-12-01 ENCOUNTER — Encounter: Payer: Self-pay | Admitting: Orthopedic Surgery

## 2022-12-01 ENCOUNTER — Non-Acute Institutional Stay (SKILLED_NURSING_FACILITY): Payer: Medicare Other | Admitting: Orthopedic Surgery

## 2022-12-01 DIAGNOSIS — I10 Essential (primary) hypertension: Secondary | ICD-10-CM

## 2022-12-01 DIAGNOSIS — J4520 Mild intermittent asthma, uncomplicated: Secondary | ICD-10-CM

## 2022-12-01 DIAGNOSIS — K219 Gastro-esophageal reflux disease without esophagitis: Secondary | ICD-10-CM | POA: Diagnosis not present

## 2022-12-01 DIAGNOSIS — M4327 Fusion of spine, lumbosacral region: Secondary | ICD-10-CM | POA: Diagnosis not present

## 2022-12-01 DIAGNOSIS — K582 Mixed irritable bowel syndrome: Secondary | ICD-10-CM

## 2022-12-01 DIAGNOSIS — J321 Chronic frontal sinusitis: Secondary | ICD-10-CM

## 2022-12-01 DIAGNOSIS — L409 Psoriasis, unspecified: Secondary | ICD-10-CM | POA: Diagnosis not present

## 2022-12-01 DIAGNOSIS — E039 Hypothyroidism, unspecified: Secondary | ICD-10-CM

## 2022-12-01 NOTE — Progress Notes (Signed)
Location:  [place] Room Number: 151A Place of Service:  SNF [id number]) Provider: Windell Moulding, NP    Patient Care Team: Ginger Organ., MD as PCP - General (Internal Medicine)  Extended Emergency Contact Information Primary Emergency Contact: Human,David Dr. Address: [place], [place] [address] Johnnette Litter of [place] Phone: [phone] Relation: Spouse Secondary Emergency Contact: Dayton Bailiff [place] Phone: [phone] Relation: Daughter  Code Status:  Full Code Goals of care: Advanced Directive information    12/01/2022   11:45 AM  Advanced Directives  Does Patient Have a Medical Advance Directive? No  Would patient like information on creating a medical advance directive? No - Patient declined     Chief Complaint  Patient presents with   Acute Visit    Back Pain and Weakness    HPI:  Pt is a 77 y.o. female seen today for f/u s/p hospitalization 01/04-01/09 at [facility].   She currently resides on the rehabilitation unit at [facility]. PMH: HTN, asthma, hypothyroidism, GERD, DDD, IBS, cervical radiculopathy, right foot drop, neuropathy and psoriatic dermatitis.   Husband present during encounter.   01/04 she underwent elective surgery due to ongoing back pain, right foot drop and neuropathy. H/o anterior posterior fusion 2017. H/o 3 falls in 2023. She continued to have increased pain and weakness after completing PT and steroid injections. She had also been taking percocet for pain relief at home, averaging about 2 daily. Followed by Dr. Nicholaus Bloom for pain management. 01/04 she underwent lumbar fusion L2-4 with hardware removal by Dr. Marzetta Board. She tolerated procedure well and was admitted to recovery unit. Recovery was complicated by ileus and urinary retention. She was made NPO for awhile, NG tube was not placed. She was eventually able to move bowels. She  also had a brief period of urinary retention requiring foley. She was able to void on her own at discharge. Husband recalls period of oversedation. Reports she was sensitive to muscle relaxer and medications were adjusted. She was discharged home 01/09. She was discharged with oxycodone and tizanidine for post op pain and muscle spasms. 01/11 she was transferred to rehab unit at [facility] due to increased lower back pain and weakness.   Today, she rates her back pain 9/10. Her husband gave her oxycodone 10 mg about 4 hours ago. Pain medication lasting about 4 hours. Right foot drop appears to have resolved. She is able to ambulate short distances with FWW. No loss of bowels/bladder. LBM 01/11, described as diarrhea. Her appetite has been poor since hospitalization. She is trying to eat more. Denies abdominal pain, N/V. She normally uses miralax daily due to IBS. She is voiding without difficulty.    Past Medical History:  Diagnosis Date   Anxiety    Arthritis    osteoarthritis   Cataract    Complication of anesthesia 90's   block for elbow surgery and pt had a seizure... surgery since with no problem    Depression    GERD (gastroesophageal reflux disease)    Glaucoma of both eyes    H/O hiatal hernia    History of pericarditis    DEC 2008 AND SMALL PERICARDIAL EFFUSION--   RESOLVED   Hyperlipidemia    Hypertension    Hypothyroidism    IBS (irritable bowel syndrome)    Interstitial cystitis    Migraines    Pelvic pain  Seizures ([place])    one occurence in the 1990's after anesthesia, no problems since   Spinal stenosis    Vitamin D deficiency    Past Surgical History:  Procedure Laterality Date   ANTERIOR CERVICAL DECOMP/DISCECTOMY FUSION  12/08/2011   Procedure: ANTERIOR CERVICAL DECOMPRESSION/DISCECTOMY FUSION 3 LEVELS;  Surgeon: Sinclair Ship, MD;  Location: [place];  Service: Orthopedics;  Laterality: N/A;  C 3-6 ACDF   BREAST BIOPSY Right    CARDIAC CATHETERIZATION   10-27-2007  DR Eustace Quail   NORMAL CORONARY ARTERIES/ NORMAL LVF   CARDIOVASCULAR STRESS TEST  02-25-2010  DR CRENSHAW   ANTERIOR ATTENUATION NO SCAR OR ISCHEMIA/ EF 81%   CATARACT EXTRACTION W/ INTRAOCULAR LENS  IMPLANT, BILATERAL     CESAREAN SECTION  1975   COLONOSCOPY     CYSTO WITH HYDRODISTENSION N/A 06/14/2013   Procedure: CYSTOSCOPY/HYDRODISTENSION/INSTILLATION OF MARCAINE AND PYRIDIUM;  Surgeon: Reece Packer, MD;  Location: [place];  Service: Urology;  Laterality: N/A;   ELBOW TENDON RELEASE Left 1996   GLAUCOMA SURGERY Bilateral 2004   RHINOPLASTY  2010   THUMB TENDON TRANSPOSITION Right 2012   TONSILLECTOMY  1951   TOTAL ABDOMINAL HYSTERECTOMY W/ BILATERAL SALPINGOOPHORECTOMY  1982   TRANSTHORACIC ECHOCARDIOGRAM  03-04-2010   MODERATE LVH/ NORMAL LVSF/ MILD DIASTOLIC DYSFUNCTION/ EF 47%/ MILD AI   UPPER GASTROINTESTINAL ENDOSCOPY      Allergies  Allergen Reactions   Moxifloxacin Hcl In Nacl Diarrhea and Hives   Nsaids Other (See Comments)    Chest pain   Sulfa Antibiotics Hives and Other (See Comments)    Chest pain Chest pain Chest pain    Aspirin Other (See Comments)    Chest pain   Avelox [Moxifloxacin] Hives   Chlorthalidone Other (See Comments)    Unknown    Hydrochlorothiazide     unknown   Neomycin Other (See Comments)    EYE IRRITATION (EYE OINTMENT)    Outpatient Encounter Medications as of 12/01/2022  Medication Sig   albuterol (VENTOLIN HFA) 108 (90 Base) MCG/ACT inhaler Inhale 1 puff into the lungs as needed for shortness of breath.   amLODipine (NORVASC) 5 MG tablet TAKE ONE TABLET BY MOUTH ONCE DAILY   Chlorpheniramine-DM (CORICIDIN COUGH/COLD) 4-30 MG TABS Take 1 tablet by mouth as needed (Cough).   cholecalciferol (VITAMIN D) 1000 units tablet Take 2,000 Units by mouth daily.   dexlansoprazole (DEXILANT) 60 MG capsule Take 60 mg by mouth daily.   DULoxetine (CYMBALTA) 60 MG capsule Take 1 capsule (60 mg total) by  mouth daily.   fexofenadine (ALLEGRA) 180 MG tablet Take 180 mg by mouth daily.   fluticasone (FLONASE) 50 MCG/ACT nasal spray Place 1 spray into both nostrils daily as needed for allergies.    fluticasone furoate-vilanterol (BREO ELLIPTA) 100-25 MCG/ACT AEPB Inhale 1 puff into the lungs as needed (Shortness of Breathe).   folic acid (FOLVITE) 1 MG tablet Take 1 mg by mouth daily.   gabapentin (NEURONTIN) 300 MG capsule Take 300 mg by mouth 3 (three) times daily.   guaiFENesin (MUCINEX) 600 MG 12 hr tablet Take 600 mg by mouth 2 (two) times daily as needed for cough.   hydrALAZINE (APRESOLINE) 10 MG tablet Take 10 mg by mouth every 8 (eight) hours as needed.   hydrOXYzine (ATARAX) 25 MG tablet Take 25 mg by mouth as needed (Sleep).   levothyroxine (SYNTHROID, LEVOTHROID) 125 MCG tablet Take 125 mcg by mouth daily before breakfast.   meclizine (ANTIVERT) 50 MG  tablet Take 1 tablet (50 mg total) by mouth 3 (three) times daily as needed.   methotrexate (RHEUMATREX) 2.5 MG tablet Take 2.5 mg by mouth daily. Caution:Chemotherapy. Protect from light.   Multiple Vitamins-Minerals (MULTIVITAMINS THER. W/MINERALS) TABS Take 1 tablet by mouth daily.   oxyCODONE (OXY IR/ROXICODONE) 5 MG immediate release tablet Take 10-325 mg by mouth daily as needed for moderate pain or severe pain.    oxyCODONE-acetaminophen (PERCOCET) 10-325 MG tablet Take 1 tablet by mouth every 6 (six) hours as needed for pain.   polyethylene glycol (MIRALAX) packet Use three times daily until bowels move - Max 3 consecutive days   timolol (TIMOPTIC) 0.5 % ophthalmic solution Place 1 drop into both eyes daily.    tiZANidine (ZANAFLEX) 2 MG tablet Take 2 mg by mouth 3 (three) times daily as needed.   [DISCONTINUED] amoxicillin-clavulanate (AUGMENTIN) 875-125 MG tablet Take 1 tablet by mouth 2 (two) times daily.   [DISCONTINUED] amLODipine (NORVASC) 10 MG tablet Take 0.5 tablets (5 mg total) by mouth daily.   [DISCONTINUED] azithromycin  (ZITHROMAX) 250 MG tablet Take 1 tablet (250 mg total) by mouth daily. Take first 2 tablets together, then 1 every day until finished.   [DISCONTINUED] benzonatate (TESSALON) 100 MG capsule Take 1 capsule (100 mg total) by mouth 3 (three) times daily as needed for cough.   [DISCONTINUED] cyclobenzaprine (FLEXERIL) 10 MG tablet Flexeril 10 mg tablet  Take 1 tablet every 8 hours by oral route as needed for spasm for 10 days.   [DISCONTINUED] DULoxetine (CYMBALTA) 30 MG capsule Take 1 capsule (30 mg total) by mouth daily after supper for 7 days.   [DISCONTINUED] escitalopram (LEXAPRO) 10 MG tablet Take 10 mg by mouth daily.    [DISCONTINUED] famciclovir (FAMVIR) 500 MG tablet Take by mouth 3 (three) times daily as needed (outbreak).    [DISCONTINUED] LORazepam (ATIVAN) 1 MG tablet Take 1 mg by mouth daily as needed.   [DISCONTINUED] SYMBICORT 80-4.5 MCG/ACT inhaler Inhale 1 puff into the lungs daily.   Facility-Administered Encounter Medications as of 12/01/2022  Medication   0.9 %  sodium chloride infusion    Review of Systems  Constitutional:  Negative for activity change and appetite change.  HENT:  Positive for congestion. Negative for sore throat and trouble swallowing.   Eyes:  Negative for visual disturbance.  Respiratory:  Negative for cough, shortness of breath and wheezing.   Cardiovascular:  Negative for chest pain and leg swelling.  Gastrointestinal:  Positive for constipation and diarrhea. Negative for abdominal distention, abdominal pain, nausea and vomiting.  Genitourinary:  Negative for decreased urine volume, dysuria and frequency.  Musculoskeletal:  Positive for arthralgias, back pain and gait problem.  Skin:  Positive for wound.       Surgical incision  Neurological:  Positive for weakness. Negative for dizziness and headaches.  Psychiatric/Behavioral:  Negative for confusion and dysphoric mood. The patient is not nervous/anxious.     Immunization History  Administered  Date(s) Administered   Influenza Split 12/30/2009, 08/13/2010, 09/09/2011, 08/15/2012, 08/21/2013, 08/20/2014   Influenza, High Dose Seasonal PF 08/12/2017, 08/28/2022   Influenza, Quadrivalent, Recombinant, Inj, Pf 07/29/2018, 08/23/2019   Influenza,inj,Quad PF,6+ Mos 08/20/2014, 07/18/2015   Influenza,inj,quad, With Preservative 08/22/2018   Influenza-Unspecified 09/23/2020, 08/28/2022   Moderna Sars-Covid-2 Vaccination 12/04/2019, 01/01/2020, 08/07/2020, 08/18/2021   Pneumococcal Conjugate-13 04/10/2014   Pneumococcal Polysaccharide-23 12/30/2009, 01/05/2011, 04/04/2013   Td (Adult),5 Lf Tetanus Toxid, Preservative Free 12/30/2009   Tdap 02/23/2022   Zoster, Live 01/05/2011   Pertinent  Health Maintenance Due  Topic Date Due   DEXA SCAN  Never done   COLONOSCOPY (Pts 45-78yr Insurance coverage will need to be confirmed)  12/06/2022   MAMMOGRAM  05/18/2024   INFLUENZA VACCINE  Completed      10/17/2018    1:27 PM 08/26/2019    5:19 AM 05/21/2021    6:54 AM 10/16/2022    6:49 PM 12/01/2022   11:53 AM  Fall Risk  Falls in the past year?     0  Was there an injury with Fall?     0  Fall Risk Category Calculator     0  Fall Risk Category     Low  Patient Fall Risk Level Low fall risk Low fall risk Low fall risk Low fall risk Low fall risk  Patient at Risk for Falls Due to     No Fall Risks  Fall risk Follow up     Falls evaluation completed   Functional Status Survey:    Vitals:   12/01/22 1128  BP: (!) 140/89  Pulse: 85  Resp: 16  Temp: 97.8 F (36.6 C)  SpO2: 94%  Weight: 175 lb 2 oz (79.4 kg)  Height: '5\' 3"'$  (1.6 m)   Body mass index is 31.02 kg/m. Physical Exam Vitals reviewed.  Constitutional:      General: She is not in acute distress. HENT:     Head: Normocephalic.  Eyes:     General:        Right eye: No discharge.        Left eye: No discharge.  Cardiovascular:     Rate and Rhythm: Normal rate and regular rhythm.     Pulses: Normal pulses.      Heart sounds: Normal heart sounds.  Pulmonary:     Effort: Pulmonary effort is normal. No respiratory distress.     Breath sounds: Normal breath sounds. No wheezing.  Abdominal:     General: Bowel sounds are normal. There is no distension.     Palpations: Abdomen is soft.     Tenderness: There is no abdominal tenderness.  Musculoskeletal:     Cervical back: Neck supple.     Lumbar back: Swelling and tenderness present.     Right lower leg: No edema.     Left lower leg: No edema.     Comments: Right foot drop resolved, UTA surgical incision due to increased pain, bilateral dorsiflexion 5/5  Skin:    General: Skin is warm and dry.     Capillary Refill: Capillary refill takes less than 2 seconds.  Neurological:     General: No focal deficit present.     Mental Status: She is alert and oriented to person, place, and time.     Motor: Weakness present.     Gait: Gait abnormal.     Comments: FWW  Psychiatric:        Mood and Affect: Mood normal.        Behavior: Behavior normal.     Labs reviewed: Recent Labs    10/16/22 1851  NA 140  K 3.7  CL 102  CO2 27  GLUCOSE 91  BUN 20  CREATININE 1.00  CALCIUM 9.3   Recent Labs    10/16/22 1851  AST 14*  ALT 13  ALKPHOS 68  BILITOT 0.3  PROT 7.2  ALBUMIN 4.4   Recent Labs    10/16/22 1851  WBC 9.1  HGB 13.7  HCT 43.0  MCV 95.1  PLT 331    No results found for: "HGBA1C" No results found for: "CHOL", "HDL", "LDLCALC", "LDLDIRECT", "TRIG", "CHOLHDL"  Significant Diagnostic Results in last 30 days:  CT LUMBAR SPINE WO CONTRAST  Result Date: 11/16/2022 CLINICAL DATA:  Spinal stenosis of lumbosacral region. Lumbar radiculopathy. EXAM: CT LUMBAR SPINE WITHOUT CONTRAST TECHNIQUE: Multidetector CT imaging of the lumbar spine was performed without intravenous contrast administration. Multiplanar CT image reconstructions were also generated. RADIATION DOSE REDUCTION: This exam was performed according to the departmental  dose-optimization program which includes automated exposure control, adjustment of the mA and/or kV according to patient size and/or use of iterative reconstruction technique. COMPARISON:  None Available. FINDINGS: Segmentation: 5 lumbar type vertebrae Alignment: Normal. Vertebrae: No acute fracture or focal pathologic process. L4-5 and L5-S1 solid arthrodesis. No fracture or aggressive bone lesion. Subjective generalized osteopenia Paraspinal and other soft tissues: No evidence of perispinal mass or inflammation. Atrophy of lower intrinsic back muscles Disc levels: T12- L1: Unremarkable. L1-L2: Unremarkable. L2-L3: Disc collapse and endplate degeneration with circumferential bulging. Moderate left foraminal stenosis. L3-L4: Disc narrowing and right more than left foraminal bulging. Gas containing disc fissure eccentric to the right. Mild right foraminal stenosis L4-L5: Solid fusion.  No impingement L5-S1:Solid fusion.  No impingement IMPRESSION: 1. L4-S1 solid arthrodesis with no recurrent impingement. 2. L2-3 and L3-4 disc degeneration greater at L2-3 where there is moderate left foraminal stenosis. Electronically Signed   By: Jorje Guild M.D.   On: 11/16/2022 14:26    Assessment/Plan 1. Fusion of spine of lumbosacral region - h/o anterior posterior fusion 2017 - 01/04 lumbar fusion L2-4 with hardware removal by Dr. Clelia Schaumann [facility] - possible ileus, urinary retention and oversedation after procedure - right foot drop resolved, ambulating with FWW, still having increased pain - cont Oxycodone 5-15 mg q4 hrs - will add tylenol 1000 mg po BID - add Narcan- give 1 spray if unresponsive or RR< 8 - cont gabapentin - cont tizanidine - start cold applications QID prn - start miralax daily - PT/OT evaluation  2. Irritable bowel syndrome with both constipation and diarrhea - LBM 01/11, reports diarrhea - distension, active BS x 4 - not eating much - cont miralax daily and  prn - see above  3. Hypothyroidism, unspecified type - cont levothyroxine  4. Gastroesophageal reflux disease without esophagitis - cont dexlansoprazole and famotidine  5. Sinusitis chronic, frontal - cont allegra and Flonase  6. Psoriasis and similar disorder - followed by rheumatology - cont methoxotrexate  7. Mild intermittent extrinsic asthma without complication - no recent exacerbations - cont albuterol and Breo  8. Primary hypertension - controlled with amlodipine and hydralazine prn    Family/ staff Communication: plan discussed with patient and nurse  Labs/tests ordered:  none

## 2022-12-02 DIAGNOSIS — M4326 Fusion of spine, lumbar region: Secondary | ICD-10-CM | POA: Diagnosis not present

## 2022-12-02 DIAGNOSIS — R2689 Other abnormalities of gait and mobility: Secondary | ICD-10-CM | POA: Diagnosis not present

## 2022-12-02 DIAGNOSIS — M62561 Muscle wasting and atrophy, not elsewhere classified, right lower leg: Secondary | ICD-10-CM | POA: Diagnosis not present

## 2022-12-02 DIAGNOSIS — M62562 Muscle wasting and atrophy, not elsewhere classified, left lower leg: Secondary | ICD-10-CM | POA: Diagnosis not present

## 2022-12-02 DIAGNOSIS — M5459 Other low back pain: Secondary | ICD-10-CM | POA: Diagnosis not present

## 2022-12-02 DIAGNOSIS — M6389 Disorders of muscle in diseases classified elsewhere, multiple sites: Secondary | ICD-10-CM | POA: Diagnosis not present

## 2022-12-02 DIAGNOSIS — R278 Other lack of coordination: Secondary | ICD-10-CM | POA: Diagnosis not present

## 2022-12-02 DIAGNOSIS — M5416 Radiculopathy, lumbar region: Secondary | ICD-10-CM | POA: Diagnosis not present

## 2022-12-02 DIAGNOSIS — R2681 Unsteadiness on feet: Secondary | ICD-10-CM | POA: Diagnosis not present

## 2022-12-03 ENCOUNTER — Other Ambulatory Visit: Payer: Self-pay | Admitting: Adult Health

## 2022-12-03 DIAGNOSIS — M4326 Fusion of spine, lumbar region: Secondary | ICD-10-CM | POA: Diagnosis not present

## 2022-12-03 DIAGNOSIS — R278 Other lack of coordination: Secondary | ICD-10-CM | POA: Diagnosis not present

## 2022-12-03 DIAGNOSIS — M6389 Disorders of muscle in diseases classified elsewhere, multiple sites: Secondary | ICD-10-CM | POA: Diagnosis not present

## 2022-12-03 DIAGNOSIS — M5459 Other low back pain: Secondary | ICD-10-CM | POA: Diagnosis not present

## 2022-12-03 DIAGNOSIS — R2689 Other abnormalities of gait and mobility: Secondary | ICD-10-CM | POA: Diagnosis not present

## 2022-12-03 DIAGNOSIS — M5416 Radiculopathy, lumbar region: Secondary | ICD-10-CM | POA: Diagnosis not present

## 2022-12-03 MED ORDER — ACETAMINOPHEN 500 MG PO TABS: 1000.0000 mg | ORAL_TABLET | Freq: Every day | ORAL | 0 refills | Status: DC | PRN

## 2022-12-03 MED ORDER — ACETAMINOPHEN 500 MG PO TABS: 1000.0000 mg | ORAL_TABLET | Freq: Two times a day (BID) | ORAL | 0 refills | Status: DC

## 2022-12-03 MED ORDER — POLYETHYLENE GLYCOL 3350 17 G PO PACK: 17.0000 g | PACK | Freq: Every day | ORAL | 0 refills | Status: DC

## 2022-12-03 MED ORDER — OXYCODONE HCL 5 MG PO TABS: 5.0000 mg | ORAL_TABLET | ORAL | 0 refills | Status: DC | PRN

## 2022-12-03 MED ORDER — NALOXONE HCL 4 MG/0.1ML NA LIQD: 1.0000 | Freq: Once | NASAL | 0 refills | Status: AC | PRN

## 2022-12-03 MED ORDER — POLYETHYLENE GLYCOL 3350 17 GM/SCOOP PO POWD: 17.0000 g | Freq: Every day | ORAL | 1 refills | Status: AC | PRN

## 2022-12-05 ENCOUNTER — Telehealth: Payer: Self-pay | Admitting: Family

## 2022-12-05 DIAGNOSIS — N390 Urinary tract infection, site not specified: Secondary | ICD-10-CM | POA: Diagnosis not present

## 2022-12-05 NOTE — Telephone Encounter (Signed)
Facility Nurse from [facility] called reports final urine culture result positive for Proteus Mirabilis and E.Coli 10,000 -50,000 colonies.usually treated if > 100,000 colonies but states patient's symptoms persist with urine frequency and urgency.states usually knows when she has UTI.will treat clinically with Cipro 500 mg tablet one by mouth twice daily x 7 days  Take along with probiotics Florastor 250 mg capsule one by mouth twice daily x 10 days to prevent antibiotics associated diarrhea

## 2022-12-05 NOTE — Telephone Encounter (Signed)
FYI : patient allergic to Avelox.Cipro discontinued and Keflex 500 mg tablet one by mouth twice daily x 7 days ordered.

## 2022-12-06 ENCOUNTER — Encounter: Payer: Self-pay | Admitting: Internal Medicine

## 2022-12-06 ENCOUNTER — Other Ambulatory Visit: Payer: Self-pay | Admitting: Internal Medicine

## 2022-12-06 ENCOUNTER — Non-Acute Institutional Stay (SKILLED_NURSING_FACILITY): Payer: Medicare Other | Admitting: Internal Medicine

## 2022-12-06 DIAGNOSIS — R278 Other lack of coordination: Secondary | ICD-10-CM | POA: Diagnosis not present

## 2022-12-06 DIAGNOSIS — M4327 Fusion of spine, lumbosacral region: Secondary | ICD-10-CM

## 2022-12-06 DIAGNOSIS — N3000 Acute cystitis without hematuria: Secondary | ICD-10-CM | POA: Diagnosis not present

## 2022-12-06 DIAGNOSIS — G8929 Other chronic pain: Secondary | ICD-10-CM

## 2022-12-06 DIAGNOSIS — K219 Gastro-esophageal reflux disease without esophagitis: Secondary | ICD-10-CM

## 2022-12-06 DIAGNOSIS — M5416 Radiculopathy, lumbar region: Secondary | ICD-10-CM | POA: Diagnosis not present

## 2022-12-06 DIAGNOSIS — K582 Mixed irritable bowel syndrome: Secondary | ICD-10-CM | POA: Diagnosis not present

## 2022-12-06 DIAGNOSIS — M6389 Disorders of muscle in diseases classified elsewhere, multiple sites: Secondary | ICD-10-CM | POA: Diagnosis not present

## 2022-12-06 DIAGNOSIS — E039 Hypothyroidism, unspecified: Secondary | ICD-10-CM | POA: Diagnosis not present

## 2022-12-06 DIAGNOSIS — M5459 Other low back pain: Secondary | ICD-10-CM | POA: Diagnosis not present

## 2022-12-06 DIAGNOSIS — J4520 Mild intermittent asthma, uncomplicated: Secondary | ICD-10-CM

## 2022-12-06 DIAGNOSIS — I10 Essential (primary) hypertension: Secondary | ICD-10-CM

## 2022-12-06 DIAGNOSIS — M4326 Fusion of spine, lumbar region: Secondary | ICD-10-CM | POA: Diagnosis not present

## 2022-12-06 DIAGNOSIS — R2689 Other abnormalities of gait and mobility: Secondary | ICD-10-CM | POA: Diagnosis not present

## 2022-12-06 MED ORDER — OXYCODONE HCL 5 MG PO TABS: 15.0000 mg | ORAL_TABLET | ORAL | 0 refills | Status: DC | PRN

## 2022-12-06 NOTE — Progress Notes (Signed)
A user error has taken place.

## 2022-12-06 NOTE — Progress Notes (Signed)
Provider:  Veleta Miners, MD Location:   [facility] Room Number: 151 A Place of Service:  SNF ([phone])  PCP: Ginger Organ., MD Patient Care Team: Ginger Organ., MD as PCP - General (Internal Medicine)  Extended Emergency Contact Information Primary Emergency Contact: Gamel,David Dr. Address: [place], [place] [address] Johnnette Litter of [place] Phone: [phone] Relation: Spouse Secondary Emergency Contact: [place] B[place]iff [place] Phone: [phone] Relation: Daughter  Code Status: FULL CODE Goals of Care: Advanced Directive information    12/06/2022    2:22 PM  Advanced Directives  Does Patient Have a Medical Advance Directive? No      Chief Complaint  Patient presents with   New Admit To SNF    New admission    HPI: Patient is a 77 y.o. female seen today for admission to SNF for Pain Control and Rehab  Patient was admitted at [facility] from 1/ 4-1/09 for Elective Spinal  Surgery Lumbar L2-4 with Hardware removal  And is now in rehab She has a history of hypertension, hypothyroidism, GERD, IBS and Psoriatec dermatitis, neuropathy  Patient had  ongoing Chronic back  pain and then she developed right foot drop.  Because of that she had 3 falls.  She underwent therapy and steroid injection.  She also was taking Percocet for pain relief from Dr. Nicholaus Bloom. Eventually she underwent surgery on 01/04 Postop she developed ileus and urinary retention .  She was eventually discharged with her husband who is a physician to her apartment. But used to continued pain and weakness it was decided to move her to rehab. Now she is on 15 mg of oxycodone every 4 hours.  Also taking Zanaflex and Tylenol. She is able to do therapy walk.  Able to do some of her ADLs. Her bowels are now moving and she is eating She does left symptoms of acute cystitis and is on Keflex now  Past Medical History:   Diagnosis Date   Anxiety    Arthritis    osteoarthritis   Cataract    Complication of anesthesia 90's   block for elbow surgery and pt had a seizure... surgery since with no problem    Depression    GERD (gastroesophageal reflux disease)    Glaucoma of both eyes    H/O hiatal hernia    History of pericarditis    DEC 2008 AND SMALL PERICARDIAL EFFUSION--   RESOLVED   Hyperlipidemia    Hypertension    Hypothyroidism    IBS (irritable bowel syndrome)    Interstitial cystitis    Migraines    Pelvic pain    Seizures ([place])    one occurence in the 1990's after anesthesia, no problems since   Spinal stenosis    Vitamin D deficiency    Past Surgical History:  Procedure Laterality Date   ANTERIOR CERVICAL DECOMP/DISCECTOMY FUSION  12/08/2011   Procedure: ANTERIOR CERVICAL DECOMPRESSION/DISCECTOMY FUSION 3 LEVELS;  Surgeon: Sinclair Ship, MD;  Location: [facility];  Service: Orthopedics;  Laterality: N/A;  C 3-6 ACDF   BREAST BIOPSY Right    CARDIAC CATHETERIZATION  10-27-2007  DR Eustace Quail   NORMAL CORONARY ARTERIES/ NORMAL LVF   CARDIOVASCULAR STRESS TEST  02-25-2010  DR CRENSHAW   ANTERIOR ATTENUATION NO SCAR OR ISCHEMIA/ EF 81%   CATARACT EXTRACTION W/ INTRAOCULAR LENS  IMPLANT, BILATERAL     CESAREAN  SECTION  1975   COLONOSCOPY     CYSTO WITH HYDRODISTENSION N/A 06/14/2013   Procedure: CYSTOSCOPY/HYDRODISTENSION/INSTILLATION OF MARCAINE AND PYRIDIUM;  Surgeon: Reece Packer, MD;  Location: [facility];  Service: Urology;  Laterality: N/A;   ELBOW TENDON RELEASE Left 1996   GLAUCOMA SURGERY Bilateral 2004   RHINOPLASTY  2010   THUMB TENDON TRANSPOSITION Right 2012   TONSILLECTOMY  1951   TOTAL ABDOMINAL HYSTERECTOMY W/ BILATERAL SALPINGOOPHORECTOMY  1982   TRANSTHORACIC ECHOCARDIOGRAM  03-04-2010   MODERATE LVH/ NORMAL LVSF/ MILD DIASTOLIC DYSFUNCTION/ EF 29%/ MILD AI   UPPER GASTROINTESTINAL ENDOSCOPY      reports that she has never smoked. She has  never used smokeless tobacco. She reports current alcohol use of about 2.0 standard drinks of alcohol per week. She reports that she does not use drugs. Social History   Socioeconomic History   Marital status: Married    Spouse name: Not on file   Number of children: 1   Years of education: Not on file   Highest education level: Not on file  Occupational History   Occupation: Retired  Tobacco Use   Smoking status: Never   Smokeless tobacco: Never  Vaping Use   Vaping Use: Never used  Substance and Sexual Activity   Alcohol use: Yes    Alcohol/week: 2.0 standard drinks of alcohol    Types: 2 Glasses of wine per week   Drug use: No   Sexual activity: Not on file  Other Topics Concern   Not on file  Social History Narrative   Not on file   Social Determinants of Health   Financial Resource Strain: Not on file  Food Insecurity: Not on file  Transportation Needs: Not on file  Physical Activity: Not on file  Stress: Not on file  Social Connections: Not on file  Intimate Partner Violence: Not on file    Functional Status Survey:    Family History  Problem Relation Age of Onset   Pulmonary fibrosis Mother    Lung disease Father    Breast cancer Maternal Aunt    Colon cancer Neg Hx    Esophageal cancer Neg Hx    Rectal cancer Neg Hx    Stomach cancer Neg Hx     Health Maintenance  Topic Date Due   Hepatitis C Screening  Never done   Zoster Vaccines- Shingrix (1 of 2) Never done   DEXA SCAN  Never done   Medicare Annual Wellness (AWV)  01/26/2022   COVID-19 Vaccine (5 - 2023-24 season) 07/23/2022   COLONOSCOPY (Pts 45-66yr Insurance coverage will need to be confirmed)  12/06/2022   MAMMOGRAM  05/18/2024   DTaP/Tdap/Td (2 - Td or Tdap) 02/24/2032   Pneumonia Vaccine 77 Years old  Completed   INFLUENZA VACCINE  Completed   HPV VACCINES  Aged Out    Allergies  Allergen Reactions   Moxifloxacin Hcl In Nacl Diarrhea and Hives   Nsaids Other (See Comments)     Chest pain   Sulfa Antibiotics Hives and Other (See Comments)    Chest pain Chest pain Chest pain    Aspirin Other (See Comments)    Chest pain   Avelox [Moxifloxacin] Hives   Chlorthalidone Other (See Comments)    Unknown    Hydrochlorothiazide     unknown   Neomycin Other (See Comments)    EYE IRRITATION (EYE OINTMENT)    Allergies as of 12/06/2022       Reactions  Moxifloxacin Hcl In Nacl Diarrhea, Hives   Nsaids Other (See Comments)   Chest pain   Sulfa Antibiotics Hives, Other (See Comments)   Chest pain Chest pain Chest pain   Aspirin Other (See Comments)   Chest pain   Avelox [moxifloxacin] Hives   Chlorthalidone Other (See Comments)   Unknown    Hydrochlorothiazide    unknown   Neomycin Other (See Comments)   EYE IRRITATION (EYE OINTMENT)        Medication List        Accurate as of December 06, 2022  2:37 PM. If you have any questions, ask your nurse or doctor.          acetaminophen 500 MG tablet Commonly known as: TYLENOL Take 2 tablets (1,000 mg total) by mouth 2 (two) times daily.   acetaminophen 500 MG tablet Commonly known as: TYLENOL Take 2 tablets (1,000 mg total) by mouth daily as needed.   albuterol 108 (90 Base) MCG/ACT inhaler Commonly known as: VENTOLIN HFA Inhale 1 puff into the lungs as needed for shortness of breath.   amLODipine 5 MG tablet Commonly known as: NORVASC TAKE ONE TABLET BY MOUTH ONCE DAILY   Breo Ellipta 100-25 MCG/ACT Aepb Generic drug: fluticasone furoate-vilanterol Inhale 1 puff into the lungs as needed (Shortness of Breathe).   cephALEXin 500 MG capsule Commonly known as: KEFLEX Take 500 mg by mouth 2 (two) times daily.   cholecalciferol 1000 units tablet Commonly known as: VITAMIN D Take 2,000 Units by mouth daily.   CORICIDIN COUGH/COLD 4-30 MG Tabs Generic drug: Chlorpheniramine-DM Take 1 tablet by mouth as needed (Cough).   dexlansoprazole 60 MG capsule Commonly known as: DEXILANT Take 60  mg by mouth daily.   DULoxetine 60 MG capsule Commonly known as: CYMBALTA Take 1 capsule (60 mg total) by mouth daily.   famotidine 20 MG tablet Commonly known as: PEPCID Take 20 mg by mouth daily.   fexofenadine 180 MG tablet Commonly known as: ALLEGRA Take 180 mg by mouth daily.   fluticasone 50 MCG/ACT nasal spray Commonly known as: FLONASE Place 1 spray into both nostrils daily as needed for allergies.   folic acid 1 MG tablet Commonly known as: FOLVITE Take 1 mg by mouth daily.   gabapentin 300 MG capsule Commonly known as: NEURONTIN Take 300 mg by mouth 3 (three) times daily.   guaiFENesin 600 MG 12 hr tablet Commonly known as: MUCINEX Take 600 mg by mouth 2 (two) times daily as needed for cough.   hydrALAZINE 10 MG tablet Commonly known as: APRESOLINE Take 10 mg by mouth every 8 (eight) hours as needed.   hydrOXYzine 25 MG tablet Commonly known as: ATARAX Take 25 mg by mouth as needed (Sleep).   levothyroxine 125 MCG tablet Commonly known as: SYNTHROID Take 125 mcg by mouth daily before breakfast.   meclizine 25 MG tablet Commonly known as: ANTIVERT Take 25 mg by mouth as needed for dizziness. What changed: Another medication with the same name was removed. Continue taking this medication, and follow the directions you see here. Changed by: Virgie Dad, MD   methotrexate 2.5 MG tablet Commonly known as: RHEUMATREX Take 2.5 mg by mouth daily. Caution:Chemotherapy. Protect from light. Friday, Saturday   multivitamins ther. w/minerals Tabs tablet Take 1 tablet by mouth daily.   naloxone 4 MG/0.1ML Liqd nasal spray kit Commonly known as: Narcan Place 1 spray into the nose once as needed for up to 1 dose. Use if unresponsive or respirations <  8   oxyCODONE 5 MG immediate release tablet Commonly known as: Oxy IR/ROXICODONE Take 3 tablets (15 mg total) by mouth every 4 (four) hours as needed for moderate pain or severe pain. What changed:  how much to  take additional instructions Changed by: Virgie Dad, MD   polyethylene glycol powder 17 GM/SCOOP powder Commonly known as: GLYCOLAX/MIRALAX Take 17 g by mouth daily as needed.   saccharomyces boulardii 250 MG capsule Commonly known as: FLORASTOR Take 250 mg by mouth 2 (two) times daily.   timolol 0.5 % ophthalmic solution Commonly known as: TIMOPTIC Place 1 drop into both eyes daily.   tiZANidine 2 MG tablet Commonly known as: ZANAFLEX Take 2 mg by mouth 3 (three) times daily as needed.        Review of Systems  Constitutional:  Negative for activity change and appetite change.  HENT: Negative.    Respiratory:  Negative for cough and shortness of breath.   Cardiovascular:  Negative for leg swelling.  Gastrointestinal:  Negative for constipation.  Genitourinary: Negative.   Musculoskeletal:  Positive for back pain and gait problem. Negative for arthralgias and myalgias.  Skin: Negative.   Neurological:  Negative for dizziness and weakness.  Psychiatric/Behavioral:  Negative for confusion, dysphoric mood and sleep disturbance.     Vitals:   12/06/22 1419  BP: (!) 161/72  Pulse: 79  Resp: 17  Temp: 98 F (36.7 C)  SpO2: 95%  Weight: 175 lb 3.2 oz (79.5 kg)  Height: '5\' 3"'$  (1.6 m)   Body mass index is 31.04 kg/m. Physical Exam Vitals reviewed.  Constitutional:      Appearance: Normal appearance.  HENT:     Head: Normocephalic.     Nose: Nose normal.     Mouth/Throat:     Mouth: Mucous membranes are moist.     Pharynx: Oropharynx is clear.  Eyes:     Pupils: Pupils are equal, round, and reactive to light.  Cardiovascular:     Rate and Rhythm: Normal rate and regular rhythm.     Pulses: Normal pulses.     Heart sounds: Normal heart sounds. No murmur heard. Pulmonary:     Effort: Pulmonary effort is normal.     Breath sounds: Normal breath sounds.  Abdominal:     General: Abdomen is flat. Bowel sounds are normal.     Palpations: Abdomen is soft.   Musculoskeletal:        General: No swelling.     Cervical back: Neck supple.  Skin:    General: Skin is warm.  Neurological:     General: No focal deficit present.     Mental Status: She is alert and oriented to person, place, and time.  Psychiatric:        Mood and Affect: Mood normal.        Thought Content: Thought content normal.     Labs reviewed: Basic Metabolic Panel: Recent Labs    10/16/22 1851  NA 140  K 3.7  CL 102  CO2 27  GLUCOSE 91  BUN 20  CREATININE 1.00  CALCIUM 9.3   Liver Function Tests: Recent Labs    10/16/22 1851  AST 14*  ALT 13  ALKPHOS 68  BILITOT 0.3  PROT 7.2  ALBUMIN 4.4   Recent Labs    10/16/22 1851  LIPASE 14   No results for input(s): "AMMONIA" in the last 8760 hours. CBC: Recent Labs    10/16/22 1851  WBC 9.1  HGB  13.7  HCT 43.0  MCV 95.1  PLT 331   Cardiac Enzymes: No results for input(s): "CKTOTAL", "CKMB", "CKMBINDEX", "TROPONINI" in the last 8760 hours. BNP: Invalid input(s): "POCBNP" No results found for: "HGBA1C"  No results found for: "VITAMINB12" No results found for: "FOLATE" No results found for: "IRON", "TIBC", "FERRITIN"  Imaging and Procedures obtained prior to SNF admission: CT LUMBAR SPINE WO CONTRAST  Result Date: 11/16/2022 CLINICAL DATA:  Spinal stenosis of lumbosacral region. Lumbar radiculopathy. EXAM: CT LUMBAR SPINE WITHOUT CONTRAST TECHNIQUE: Multidetector CT imaging of the lumbar spine was performed without intravenous contrast administration. Multiplanar CT image reconstructions were also generated. RADIATION DOSE REDUCTION: This exam was performed according to the departmental dose-optimization program which includes automated exposure control, adjustment of the mA and/or kV according to patient size and/or use of iterative reconstruction technique. COMPARISON:  None Available. FINDINGS: Segmentation: 5 lumbar type vertebrae Alignment: Normal. Vertebrae: No acute fracture or focal  pathologic process. L4-5 and L5-S1 solid arthrodesis. No fracture or aggressive bone lesion. Subjective generalized osteopenia Paraspinal and other soft tissues: No evidence of perispinal mass or inflammation. Atrophy of lower intrinsic back muscles Disc levels: T12- L1: Unremarkable. L1-L2: Unremarkable. L2-L3: Disc collapse and endplate degeneration with circumferential bulging. Moderate left foraminal stenosis. L3-L4: Disc narrowing and right more than left foraminal bulging. Gas containing disc fissure eccentric to the right. Mild right foraminal stenosis L4-L5: Solid fusion.  No impingement L5-S1:Solid fusion.  No impingement IMPRESSION: 1. L4-S1 solid arthrodesis with no recurrent impingement. 2. L2-3 and L3-4 disc degeneration greater at L2-3 where there is moderate left foraminal stenosis. Electronically Signed   By: Jorje Guild M.D.   On: 11/16/2022 14:26    Assessment/Plan 1. Fusion of spine of lumbosacral region Patient is doing well with therapy She is going to [facility] for follow-up and planning to go back to her apartment in few days  2. Other chronic pain We had a long discussion about her pain control She is on very high doses of oxycodone and Zanaflex Her husband states that her pain if not controlled otherwise and she is not able to walk. She is managed by Dr. Baron Hamper We will provide her with a week supply and then she will follow-up with him after discharge  She is also on [place]  3. Acute cystitis without hematuria On Keflex  4. Hypothyroidism, unspecified type Followed byDr Brigitte Pulse  5. Gastroesophageal reflux disease without esophagitis On Dexilant  6. Irritable bowel syndrome with both constipation and diarrhea   7. Mild intermittent extrinsic asthma without complication Albuterol prn  8. Primary hypertension Norvasc 9 Inflammatory Arthritis On Methotrexate per Dr Amil Amen  Family/ staff Communication:   Labs/tests ordered:

## 2022-12-08 DIAGNOSIS — M4326 Fusion of spine, lumbar region: Secondary | ICD-10-CM | POA: Diagnosis not present

## 2022-12-08 DIAGNOSIS — M5459 Other low back pain: Secondary | ICD-10-CM | POA: Diagnosis not present

## 2022-12-08 DIAGNOSIS — M6389 Disorders of muscle in diseases classified elsewhere, multiple sites: Secondary | ICD-10-CM | POA: Diagnosis not present

## 2022-12-08 DIAGNOSIS — R278 Other lack of coordination: Secondary | ICD-10-CM | POA: Diagnosis not present

## 2022-12-08 DIAGNOSIS — R2689 Other abnormalities of gait and mobility: Secondary | ICD-10-CM | POA: Diagnosis not present

## 2022-12-08 DIAGNOSIS — M5416 Radiculopathy, lumbar region: Secondary | ICD-10-CM | POA: Diagnosis not present

## 2022-12-09 ENCOUNTER — Encounter: Payer: Self-pay | Admitting: Adult Health

## 2022-12-09 ENCOUNTER — Non-Acute Institutional Stay (SKILLED_NURSING_FACILITY): Payer: Medicare Other | Admitting: Adult Health

## 2022-12-09 DIAGNOSIS — I10 Essential (primary) hypertension: Secondary | ICD-10-CM

## 2022-12-09 DIAGNOSIS — R2689 Other abnormalities of gait and mobility: Secondary | ICD-10-CM | POA: Diagnosis not present

## 2022-12-09 DIAGNOSIS — M6389 Disorders of muscle in diseases classified elsewhere, multiple sites: Secondary | ICD-10-CM | POA: Diagnosis not present

## 2022-12-09 DIAGNOSIS — M5416 Radiculopathy, lumbar region: Secondary | ICD-10-CM | POA: Diagnosis not present

## 2022-12-09 DIAGNOSIS — M5459 Other low back pain: Secondary | ICD-10-CM | POA: Diagnosis not present

## 2022-12-09 DIAGNOSIS — K582 Mixed irritable bowel syndrome: Secondary | ICD-10-CM | POA: Diagnosis not present

## 2022-12-09 DIAGNOSIS — M4326 Fusion of spine, lumbar region: Secondary | ICD-10-CM | POA: Diagnosis not present

## 2022-12-09 DIAGNOSIS — R278 Other lack of coordination: Secondary | ICD-10-CM | POA: Diagnosis not present

## 2022-12-09 MED ORDER — ALPRAZOLAM 0.5 MG PO TABS: 0.5000 mg | ORAL_TABLET | Freq: Three times a day (TID) | ORAL | 0 refills | Status: AC | PRN

## 2022-12-09 NOTE — Progress Notes (Signed)
Location:   [place] Room Number: 151A Place of Service:  SNF [id number])  Provider:  Royal Hawthorn, NP  PCP: Ginger Organ., MD Patient Care Team: Ginger Organ., MD as PCP - General (Internal Medicine)  Extended Emergency Contact Information Primary Emergency Contact: Battershell,David Dr. Address: [place], [place] [address] [address] [place] Phone: [phone] Relation: Spouse Secondary Emergency Contact: [place] B[place] Phone: [phone] Relation: Daughter  Code Status: FULL CODE Goals of care:  Advanced Directive information    12/09/2022    9:15 AM  Advanced Directives  Does Patient Have a Medical Advance Directive? No     Allergies  Allergen Reactions   Moxifloxacin Hcl In Nacl Diarrhea and Hives   Nsaids Other (See Comments)    Chest pain   Sulfa Antibiotics Hives and Other (See Comments)    Chest pain Chest pain Chest pain    Aspirin Other (See Comments)    Chest pain   Avelox [Moxifloxacin] Hives   Chlorthalidone Other (See Comments)    Unknown    Hydrochlorothiazide     unknown   Neomycin Other (See Comments)    EYE IRRITATION (EYE OINTMENT)    Chief Complaint  Patient presents with   Discharge Note    Discharge from skilled rehab    HPI:  77 y.o. female for discharge from skilled rehab.   I was asked to see her for discharge but upon arriving on the unit she had decided to stay for a few more days due to severe pain and need for pain management.   On 1/ 4-1/09 for Elective lumbar fusion Lumbar L2-4 with Hardware removal   She is walking with therapy.   Has chronic back pain with radiculopathy. She reports left sided back pain and radiating down into the left leg described as nerve pain. Her pain has been 8-10/10.  HR and BP have been elevated with some tremors. She saw neurosurgery at [facility] 1/17 and her husband reports her wound/healing  process is going ok. Xrays were taken and reviewed by Dr Owens Shark at the apt. She is going to the bathroom and urinating well.   Her husband reports her pain is severe and uncontrolled. Prior to admission she used percocet on a regular basis. She has had back surgery before in 2017 and it took months to get off narcotics. He said her morphine equivalent after the surgery in 2017 was close to 300.   After surgery she was prescribed percocet but it did not control her pain. She was then using oxycodone 15 mg q 4 hrs with an MME of 135.  Last evening the surgeon prescribed dilaudid 2-4 mg q 3 prn MME of 160. He picked it up from [place]. He does not feel they are maximally using the zanaflex. She is also having some anxiety about the pain. He reports she has used ativan in the past and it can make her sleepy. She has not slept well in days here at [facility].     Past Medical History:  Diagnosis Date   Anxiety    Arthritis    osteoarthritis   Cataract    Complication of anesthesia 90's   block for elbow surgery and pt had a seizure... surgery since with no problem    Depression    GERD (gastroesophageal reflux disease)    Glaucoma of both eyes  H/O hiatal hernia    History of pericarditis    DEC 2008 AND SMALL PERICARDIAL EFFUSION--   RESOLVED   Hyperlipidemia    Hypertension    Hypothyroidism    IBS (irritable bowel syndrome)    Interstitial cystitis    Migraines    Pelvic pain    Seizures ([place])    one occurence in the 1990's after anesthesia, no problems since   Spinal stenosis    Vitamin D deficiency     Past Surgical History:  Procedure Laterality Date   ANTERIOR CERVICAL DECOMP/DISCECTOMY FUSION  12/08/2011   Procedure: ANTERIOR CERVICAL DECOMPRESSION/DISCECTOMY FUSION 3 LEVELS;  Surgeon: Sinclair Ship, MD;  Location: [facility];  Service: Orthopedics;  Laterality: N/A;  C 3-6 ACDF   BREAST BIOPSY Right    CARDIAC CATHETERIZATION  10-27-2007  DR Eustace Quail   NORMAL  CORONARY ARTERIES/ NORMAL LVF   CARDIOVASCULAR STRESS TEST  02-25-2010  DR CRENSHAW   ANTERIOR ATTENUATION NO SCAR OR ISCHEMIA/ EF 81%   CATARACT EXTRACTION W/ INTRAOCULAR LENS  IMPLANT, BILATERAL     CESAREAN SECTION  1975   COLONOSCOPY     CYSTO WITH HYDRODISTENSION N/A 06/14/2013   Procedure: CYSTOSCOPY/HYDRODISTENSION/INSTILLATION OF MARCAINE AND PYRIDIUM;  Surgeon: Reece Packer, MD;  Location: [facility];  Service: Urology;  Laterality: N/A;   ELBOW TENDON RELEASE Left 1996   GLAUCOMA SURGERY Bilateral 2004   RHINOPLASTY  2010   THUMB TENDON TRANSPOSITION Right 2012   TONSILLECTOMY  1951   TOTAL ABDOMINAL HYSTERECTOMY W/ BILATERAL SALPINGOOPHORECTOMY  1982   TRANSTHORACIC ECHOCARDIOGRAM  03-04-2010   MODERATE LVH/ NORMAL LVSF/ MILD DIASTOLIC DYSFUNCTION/ EF 41%/ MILD AI   UPPER GASTROINTESTINAL ENDOSCOPY        reports that she has never smoked. She has never used smokeless tobacco. She reports current alcohol use of about 2.0 standard drinks of alcohol per week. She reports that she does not use drugs. Social History   Socioeconomic History   Marital status: Married    Spouse name: Not on file   Number of children: 1   Years of education: Not on file   Highest education level: Not on file  Occupational History   Occupation: Retired  Tobacco Use   Smoking status: Never   Smokeless tobacco: Never  Vaping Use   Vaping Use: Never used  Substance and Sexual Activity   Alcohol use: Yes    Alcohol/week: 2.0 standard drinks of alcohol    Types: 2 Glasses of wine per week   Drug use: No   Sexual activity: Not on file  Other Topics Concern   Not on file  Social History Narrative   Not on file   Social Determinants of Health   Financial Resource Strain: Not on file  Food Insecurity: Not on file  Transportation Needs: Not on file  Physical Activity: Not on file  Stress: Not on file  Social Connections: Not on file  Intimate Partner Violence: Not on  file   Functional Status Survey:    Allergies  Allergen Reactions   Moxifloxacin Hcl In Nacl Diarrhea and Hives   Nsaids Other (See Comments)    Chest pain   Sulfa Antibiotics Hives and Other (See Comments)    Chest pain Chest pain Chest pain    Aspirin Other (See Comments)    Chest pain   Avelox [Moxifloxacin] Hives   Chlorthalidone Other (See Comments)    Unknown    Hydrochlorothiazide     unknown  Neomycin Other (See Comments)    EYE IRRITATION (EYE OINTMENT)    Pertinent  Health Maintenance Due  Topic Date Due   DEXA SCAN  Never done   COLONOSCOPY (Pts 45-29yr Insurance coverage will need to be confirmed)  12/06/2022   MAMMOGRAM  05/18/2024   INFLUENZA VACCINE  Completed    Medications: Allergies as of 12/09/2022       Reactions   Moxifloxacin Hcl In Nacl Diarrhea, Hives   Nsaids Other (See Comments)   Chest pain   Sulfa Antibiotics Hives, Other (See Comments)   Chest pain Chest pain Chest pain   Aspirin Other (See Comments)   Chest pain   Avelox [moxifloxacin] Hives   Chlorthalidone Other (See Comments)   Unknown    Hydrochlorothiazide    unknown   Neomycin Other (See Comments)   EYE IRRITATION (EYE OINTMENT)        Medication List        Accurate as of December 09, 2022  9:49 AM. If you have any questions, ask your nurse or doctor.          STOP taking these medications    oxyCODONE 5 MG immediate release tablet Commonly known as: Oxy IR/ROXICODONE Stopped by: CRoyal Hawthorn NP       TAKE these medications    acetaminophen 500 MG tablet Commonly known as: TYLENOL Take 2 tablets (1,000 mg total) by mouth 2 (two) times daily.   acetaminophen 500 MG tablet Commonly known as: TYLENOL Take 2 tablets (1,000 mg total) by mouth daily as needed.   albuterol 108 (90 Base) MCG/ACT inhaler Commonly known as: VENTOLIN HFA Inhale 1 puff into the lungs as needed for shortness of breath.   amLODipine 5 MG tablet Commonly known as:  NORVASC TAKE ONE TABLET BY MOUTH ONCE DAILY   Breo Ellipta 100-25 MCG/ACT Aepb Generic drug: fluticasone furoate-vilanterol Inhale 1 puff into the lungs as needed (Shortness of Breathe).   cephALEXin 500 MG capsule Commonly known as: KEFLEX Take 500 mg by mouth 2 (two) times daily.   cholecalciferol 1000 units tablet Commonly known as: VITAMIN D Take 2,000 Units by mouth daily.   CORICIDIN COUGH/COLD 4-30 MG Tabs Generic drug: Chlorpheniramine-DM Take 1 tablet by mouth as needed (Cough).   dexlansoprazole 60 MG capsule Commonly known as: DEXILANT Take 60 mg by mouth daily.   DULoxetine 60 MG capsule Commonly known as: CYMBALTA Take 1 capsule (60 mg total) by mouth daily.   famotidine 20 MG tablet Commonly known as: PEPCID Take 20 mg by mouth daily.   fexofenadine 180 MG tablet Commonly known as: ALLEGRA Take 180 mg by mouth daily.   fluticasone 50 MCG/ACT nasal spray Commonly known as: FLONASE Place 1 spray into both nostrils daily as needed for allergies.   folic acid 1 MG tablet Commonly known as: FOLVITE Take 1 mg by mouth daily.   gabapentin 300 MG capsule Commonly known as: NEURONTIN Take 300 mg by mouth 3 (three) times daily.   guaiFENesin 600 MG 12 hr tablet Commonly known as: MUCINEX Take 600 mg by mouth 2 (two) times daily as needed for cough.   hydrALAZINE 10 MG tablet Commonly known as: APRESOLINE Take 10 mg by mouth every 8 (eight) hours as needed.   HYDROmorphone 2 MG tablet Commonly known as: DILAUDID Take 2 mg by mouth every 4 (four) hours as needed for severe pain. 0.5 to 1 tablet for up to 5 days   HYDROmorphone 2 MG tablet Commonly known as:  DILAUDID Take 2-4 mg by mouth every 3 (three) hours as needed for severe pain.   hydrOXYzine 25 MG tablet Commonly known as: ATARAX Take 25 mg by mouth as needed (Sleep).   levothyroxine 125 MCG tablet Commonly known as: SYNTHROID Take 125 mcg by mouth daily before breakfast.   meclizine 25  MG tablet Commonly known as: ANTIVERT Take 25 mg by mouth as needed for dizziness.   methotrexate 2.5 MG tablet Commonly known as: RHEUMATREX Take 5 mg by mouth 2 (two) times a week. Friday, Saturday   naloxone 4 MG/0.1ML Liqd nasal spray kit Commonly known as: Narcan Place 1 spray into the nose once as needed for up to 1 dose. Use if unresponsive or respirations < 8   oxyCODONE-acetaminophen 10-325 MG tablet Commonly known as: PERCOCET Take 1.5 tablets by mouth every 4 (four) hours. For 3 doses   polyethylene glycol powder 17 GM/SCOOP powder Commonly known as: GLYCOLAX/MIRALAX Take 17 g by mouth daily as needed.   PRESERVISION AREDS PO Take by mouth 2 (two) times daily. 4,296 mcg-226 mg-90 mg; amt: 1 tab; oral What changed: Another medication with the same name was removed. Continue taking this medication, and follow the directions you see here. Changed by: Royal Hawthorn, NP   saccharomyces boulardii 250 MG capsule Commonly known as: FLORASTOR Take 250 mg by mouth 2 (two) times daily.   timolol 0.5 % ophthalmic solution Commonly known as: TIMOPTIC Place 1 drop into both eyes daily.   tiZANidine 2 MG tablet Commonly known as: ZANAFLEX Take 2 mg by mouth 3 (three) times daily as needed.        Review of Systems  Constitutional:  Positive for activity change, appetite change and fatigue. Negative for chills, diaphoresis, fever and unexpected weight change.  HENT:  Negative for congestion.   Respiratory:  Negative for cough, shortness of breath and wheezing.   Cardiovascular:  Negative for chest pain, palpitations and leg swelling.  Gastrointestinal:  Negative for abdominal distention, abdominal pain, constipation and diarrhea.  Genitourinary:  Negative for difficulty urinating and dysuria.  Musculoskeletal:  Positive for back pain and gait problem. Negative for arthralgias, joint swelling and myalgias.  Neurological:  Positive for weakness. Negative for dizziness,  tremors, seizures, syncope, facial asymmetry, speech difficulty, light-headedness, numbness and headaches.  Psychiatric/Behavioral:  Negative for agitation, behavioral problems and confusion.     Vitals:   12/09/22 0906  BP: (!) 172/81  Pulse: 100  Resp: 16  Temp: 98.1 F (36.7 C)  SpO2: 97%  Weight: 172 lb (78 kg)  Height: '5\' 3"'$  (1.6 m)   Body mass index is 30.47 kg/m. Physical Exam Vitals and nursing note reviewed.  Constitutional:      General: She is not in acute distress.    Appearance: She is not diaphoretic.  HENT:     Head: Normocephalic and atraumatic.  Neck:     Vascular: No JVD.  Cardiovascular:     Rate and Rhythm: Normal rate and regular rhythm.     Heart sounds: No murmur heard. Pulmonary:     Effort: Pulmonary effort is normal. No respiratory distress.     Breath sounds: Normal breath sounds. No wheezing.  Abdominal:     General: Bowel sounds are normal.     Palpations: Abdomen is soft.  Musculoskeletal:     Right lower leg: No edema.     Left lower leg: No edema.     Comments: Able to flex both feet and raise off the recliner  foot rest.   Skin:    General: Skin is warm and dry.  Neurological:     Mental Status: She is alert and oriented to person, place, and time.     Labs reviewed: Basic Metabolic Panel: Recent Labs    10/16/22 1851  NA 140  K 3.7  CL 102  CO2 27  GLUCOSE 91  BUN 20  CREATININE 1.00  CALCIUM 9.3   Liver Function Tests: Recent Labs    10/16/22 1851  AST 14*  ALT 13  ALKPHOS 68  BILITOT 0.3  PROT 7.2  ALBUMIN 4.4   Recent Labs    10/16/22 1851  LIPASE 14   No results for input(s): "AMMONIA" in the last 8760 hours. CBC: Recent Labs    10/16/22 1851  WBC 9.1  HGB 13.7  HCT 43.0  MCV 95.1  PLT 331   Cardiac Enzymes: No results for input(s): "CKTOTAL", "CKMB", "CKMBINDEX", "TROPONINI" in the last 8760 hours. BNP: Invalid input(s): "POCBNP" CBG: No results for input(s): "GLUCAP" in the last 8760  hours.  Procedures and Imaging Studies During Stay: CT LUMBAR SPINE WO CONTRAST  Result Date: 11/16/2022 CLINICAL DATA:  Spinal stenosis of lumbosacral region. Lumbar radiculopathy. EXAM: CT LUMBAR SPINE WITHOUT CONTRAST TECHNIQUE: Multidetector CT imaging of the lumbar spine was performed without intravenous contrast administration. Multiplanar CT image reconstructions were also generated. RADIATION DOSE REDUCTION: This exam was performed according to the departmental dose-optimization program which includes automated exposure control, adjustment of the mA and/or kV according to patient size and/or use of iterative reconstruction technique. COMPARISON:  None Available. FINDINGS: Segmentation: 5 lumbar type vertebrae Alignment: Normal. Vertebrae: No acute fracture or focal pathologic process. L4-5 and L5-S1 solid arthrodesis. No fracture or aggressive bone lesion. Subjective generalized osteopenia Paraspinal and other soft tissues: No evidence of perispinal mass or inflammation. Atrophy of lower intrinsic back muscles Disc levels: T12- L1: Unremarkable. L1-L2: Unremarkable. L2-L3: Disc collapse and endplate degeneration with circumferential bulging. Moderate left foraminal stenosis. L3-L4: Disc narrowing and right more than left foraminal bulging. Gas containing disc fissure eccentric to the right. Mild right foraminal stenosis L4-L5: Solid fusion.  No impingement L5-S1:Solid fusion.  No impingement IMPRESSION: 1. L4-S1 solid arthrodesis with no recurrent impingement. 2. L2-3 and L3-4 disc degeneration greater at L2-3 where there is moderate left foraminal stenosis. Electronically Signed   By: Jorje Guild M.D.   On: 11/16/2022 14:26    Assessment/Plan:    1. Lumbar radiculopathy S/p lumbar fusion with hardware removal Severe pain that is not well controlled Not ready for discharge She has a high tolerance for pain medication due to long term use I agree with the dilaudid prescribed by Dr Owens Shark  which does increase her MME to 160 when calculated. However, based on prior surgery and the amt of medication needed to control her pain and the hx provided by her husband (who is a physician) she may need further increase Narcan spray is available, discussed with nurse Add order to check alertness and RR and hold xanax or dilaudid if RR<10 Add xanax for anxiety and pain management  Increase neurontin to 600 mg bid and 300 at lunch   2. Primary hypertension Elevated with elevated pulse due to pain, see #1  3. Irritable bowel syndrome with both constipation and diarrhea Having regular BMs, using miralax    I spent 40 minutes in review of the chart and counseling and coordination with the patient and her husband Dr Teena Dunk. Ms. Carda is alert with  no signs of lethargy or resp depression at this time. She does have outward signs of pain and needs to stay in rehab until her pain is better controlled

## 2022-12-10 DIAGNOSIS — M4326 Fusion of spine, lumbar region: Secondary | ICD-10-CM | POA: Diagnosis not present

## 2022-12-10 DIAGNOSIS — R278 Other lack of coordination: Secondary | ICD-10-CM | POA: Diagnosis not present

## 2022-12-10 DIAGNOSIS — M6389 Disorders of muscle in diseases classified elsewhere, multiple sites: Secondary | ICD-10-CM | POA: Diagnosis not present

## 2022-12-10 DIAGNOSIS — R2689 Other abnormalities of gait and mobility: Secondary | ICD-10-CM | POA: Diagnosis not present

## 2022-12-10 DIAGNOSIS — M5459 Other low back pain: Secondary | ICD-10-CM | POA: Diagnosis not present

## 2022-12-10 DIAGNOSIS — M5416 Radiculopathy, lumbar region: Secondary | ICD-10-CM | POA: Diagnosis not present

## 2022-12-14 DIAGNOSIS — M4326 Fusion of spine, lumbar region: Secondary | ICD-10-CM | POA: Diagnosis not present

## 2022-12-14 DIAGNOSIS — R2689 Other abnormalities of gait and mobility: Secondary | ICD-10-CM | POA: Diagnosis not present

## 2022-12-14 DIAGNOSIS — R278 Other lack of coordination: Secondary | ICD-10-CM | POA: Diagnosis not present

## 2022-12-14 DIAGNOSIS — M6389 Disorders of muscle in diseases classified elsewhere, multiple sites: Secondary | ICD-10-CM | POA: Diagnosis not present

## 2022-12-14 DIAGNOSIS — M5416 Radiculopathy, lumbar region: Secondary | ICD-10-CM | POA: Diagnosis not present

## 2022-12-14 DIAGNOSIS — M5459 Other low back pain: Secondary | ICD-10-CM | POA: Diagnosis not present

## 2022-12-15 DIAGNOSIS — M6389 Disorders of muscle in diseases classified elsewhere, multiple sites: Secondary | ICD-10-CM | POA: Diagnosis not present

## 2022-12-15 DIAGNOSIS — M5459 Other low back pain: Secondary | ICD-10-CM | POA: Diagnosis not present

## 2022-12-15 DIAGNOSIS — M4326 Fusion of spine, lumbar region: Secondary | ICD-10-CM | POA: Diagnosis not present

## 2022-12-15 DIAGNOSIS — M5416 Radiculopathy, lumbar region: Secondary | ICD-10-CM | POA: Diagnosis not present

## 2022-12-15 DIAGNOSIS — R2689 Other abnormalities of gait and mobility: Secondary | ICD-10-CM | POA: Diagnosis not present

## 2022-12-15 DIAGNOSIS — R278 Other lack of coordination: Secondary | ICD-10-CM | POA: Diagnosis not present

## 2022-12-16 ENCOUNTER — Other Ambulatory Visit (HOSPITAL_BASED_OUTPATIENT_CLINIC_OR_DEPARTMENT_OTHER): Payer: Self-pay

## 2022-12-16 ENCOUNTER — Other Ambulatory Visit (HOSPITAL_COMMUNITY): Payer: Self-pay

## 2022-12-16 MED ORDER — HYDROMORPHONE HCL 2 MG PO TABS
2.0000 mg | ORAL_TABLET | Freq: Three times a day (TID) | ORAL | 0 refills | Status: DC | PRN
  Filled 2022-12-16: qty 140, 24d supply, fill #0

## 2022-12-16 MED ORDER — HYDROMORPHONE HCL 2 MG PO TABS
2.0000 mg | ORAL_TABLET | ORAL | 0 refills | Status: DC | PRN
  Filled 2022-12-16: qty 140, 9d supply, fill #0

## 2022-12-17 ENCOUNTER — Other Ambulatory Visit (HOSPITAL_BASED_OUTPATIENT_CLINIC_OR_DEPARTMENT_OTHER): Payer: Self-pay

## 2022-12-17 DIAGNOSIS — M5416 Radiculopathy, lumbar region: Secondary | ICD-10-CM | POA: Diagnosis not present

## 2022-12-17 DIAGNOSIS — M4326 Fusion of spine, lumbar region: Secondary | ICD-10-CM | POA: Diagnosis not present

## 2022-12-17 DIAGNOSIS — M6389 Disorders of muscle in diseases classified elsewhere, multiple sites: Secondary | ICD-10-CM | POA: Diagnosis not present

## 2022-12-17 DIAGNOSIS — R2689 Other abnormalities of gait and mobility: Secondary | ICD-10-CM | POA: Diagnosis not present

## 2022-12-17 DIAGNOSIS — M5459 Other low back pain: Secondary | ICD-10-CM | POA: Diagnosis not present

## 2022-12-17 DIAGNOSIS — R278 Other lack of coordination: Secondary | ICD-10-CM | POA: Diagnosis not present

## 2022-12-20 DIAGNOSIS — M47818 Spondylosis without myelopathy or radiculopathy, sacral and sacrococcygeal region: Secondary | ICD-10-CM | POA: Diagnosis not present

## 2022-12-20 DIAGNOSIS — M961 Postlaminectomy syndrome, not elsewhere classified: Secondary | ICD-10-CM | POA: Diagnosis not present

## 2022-12-20 DIAGNOSIS — G8918 Other acute postprocedural pain: Secondary | ICD-10-CM | POA: Diagnosis not present

## 2022-12-20 DIAGNOSIS — G894 Chronic pain syndrome: Secondary | ICD-10-CM | POA: Diagnosis not present

## 2022-12-23 DIAGNOSIS — M5416 Radiculopathy, lumbar region: Secondary | ICD-10-CM | POA: Diagnosis not present

## 2022-12-23 DIAGNOSIS — R2681 Unsteadiness on feet: Secondary | ICD-10-CM | POA: Diagnosis not present

## 2022-12-23 DIAGNOSIS — R2689 Other abnormalities of gait and mobility: Secondary | ICD-10-CM | POA: Diagnosis not present

## 2022-12-23 DIAGNOSIS — R278 Other lack of coordination: Secondary | ICD-10-CM | POA: Diagnosis not present

## 2022-12-23 DIAGNOSIS — M4326 Fusion of spine, lumbar region: Secondary | ICD-10-CM | POA: Diagnosis not present

## 2022-12-23 DIAGNOSIS — M5459 Other low back pain: Secondary | ICD-10-CM | POA: Diagnosis not present

## 2022-12-23 DIAGNOSIS — M62562 Muscle wasting and atrophy, not elsewhere classified, left lower leg: Secondary | ICD-10-CM | POA: Diagnosis not present

## 2022-12-23 DIAGNOSIS — M6389 Disorders of muscle in diseases classified elsewhere, multiple sites: Secondary | ICD-10-CM | POA: Diagnosis not present

## 2022-12-23 DIAGNOSIS — M62561 Muscle wasting and atrophy, not elsewhere classified, right lower leg: Secondary | ICD-10-CM | POA: Diagnosis not present

## 2022-12-24 DIAGNOSIS — R278 Other lack of coordination: Secondary | ICD-10-CM | POA: Diagnosis not present

## 2022-12-24 DIAGNOSIS — M5416 Radiculopathy, lumbar region: Secondary | ICD-10-CM | POA: Diagnosis not present

## 2022-12-24 DIAGNOSIS — M6389 Disorders of muscle in diseases classified elsewhere, multiple sites: Secondary | ICD-10-CM | POA: Diagnosis not present

## 2022-12-24 DIAGNOSIS — M4326 Fusion of spine, lumbar region: Secondary | ICD-10-CM | POA: Diagnosis not present

## 2022-12-24 DIAGNOSIS — M5459 Other low back pain: Secondary | ICD-10-CM | POA: Diagnosis not present

## 2022-12-24 DIAGNOSIS — R2689 Other abnormalities of gait and mobility: Secondary | ICD-10-CM | POA: Diagnosis not present

## 2022-12-27 DIAGNOSIS — M5459 Other low back pain: Secondary | ICD-10-CM | POA: Diagnosis not present

## 2022-12-27 DIAGNOSIS — R278 Other lack of coordination: Secondary | ICD-10-CM | POA: Diagnosis not present

## 2022-12-27 DIAGNOSIS — M5416 Radiculopathy, lumbar region: Secondary | ICD-10-CM | POA: Diagnosis not present

## 2022-12-27 DIAGNOSIS — M6389 Disorders of muscle in diseases classified elsewhere, multiple sites: Secondary | ICD-10-CM | POA: Diagnosis not present

## 2022-12-27 DIAGNOSIS — M4326 Fusion of spine, lumbar region: Secondary | ICD-10-CM | POA: Diagnosis not present

## 2022-12-27 DIAGNOSIS — R2689 Other abnormalities of gait and mobility: Secondary | ICD-10-CM | POA: Diagnosis not present

## 2022-12-28 DIAGNOSIS — M4326 Fusion of spine, lumbar region: Secondary | ICD-10-CM | POA: Diagnosis not present

## 2022-12-28 DIAGNOSIS — R2689 Other abnormalities of gait and mobility: Secondary | ICD-10-CM | POA: Diagnosis not present

## 2022-12-28 DIAGNOSIS — M6389 Disorders of muscle in diseases classified elsewhere, multiple sites: Secondary | ICD-10-CM | POA: Diagnosis not present

## 2022-12-28 DIAGNOSIS — M5416 Radiculopathy, lumbar region: Secondary | ICD-10-CM | POA: Diagnosis not present

## 2022-12-28 DIAGNOSIS — R278 Other lack of coordination: Secondary | ICD-10-CM | POA: Diagnosis not present

## 2022-12-28 DIAGNOSIS — M5459 Other low back pain: Secondary | ICD-10-CM | POA: Diagnosis not present

## 2022-12-30 ENCOUNTER — Other Ambulatory Visit (HOSPITAL_COMMUNITY): Payer: Self-pay

## 2022-12-30 MED ORDER — HYDROMORPHONE HCL 2 MG PO TABS
ORAL_TABLET | ORAL | 0 refills | Status: DC
  Filled 2022-12-30: qty 140, 9d supply, fill #0

## 2023-01-05 DIAGNOSIS — M5416 Radiculopathy, lumbar region: Secondary | ICD-10-CM | POA: Diagnosis not present

## 2023-01-06 ENCOUNTER — Other Ambulatory Visit (HOSPITAL_BASED_OUTPATIENT_CLINIC_OR_DEPARTMENT_OTHER): Payer: Self-pay

## 2023-01-06 DIAGNOSIS — G894 Chronic pain syndrome: Secondary | ICD-10-CM | POA: Diagnosis not present

## 2023-01-06 DIAGNOSIS — M961 Postlaminectomy syndrome, not elsewhere classified: Secondary | ICD-10-CM | POA: Diagnosis not present

## 2023-01-06 DIAGNOSIS — M47818 Spondylosis without myelopathy or radiculopathy, sacral and sacrococcygeal region: Secondary | ICD-10-CM | POA: Diagnosis not present

## 2023-01-06 DIAGNOSIS — G8918 Other acute postprocedural pain: Secondary | ICD-10-CM | POA: Diagnosis not present

## 2023-01-06 MED ORDER — TIZANIDINE HCL 2 MG PO TABS
2.0000 mg | ORAL_TABLET | Freq: Three times a day (TID) | ORAL | 2 refills | Status: DC | PRN
  Filled 2023-01-06: qty 90, 30d supply, fill #0

## 2023-01-06 MED ORDER — GABAPENTIN 600 MG PO TABS
600.0000 mg | ORAL_TABLET | Freq: Three times a day (TID) | ORAL | 2 refills | Status: DC
  Filled 2023-01-06: qty 60, 20d supply, fill #0
  Filled 2023-02-11: qty 60, 20d supply, fill #1

## 2023-01-06 MED ORDER — OXYCODONE-ACETAMINOPHEN 10-325 MG PO TABS
1.0000 | ORAL_TABLET | ORAL | 0 refills | Status: DC
  Filled 2023-01-06: qty 180, 30d supply, fill #0

## 2023-01-10 DIAGNOSIS — R278 Other lack of coordination: Secondary | ICD-10-CM | POA: Diagnosis not present

## 2023-01-10 DIAGNOSIS — R2689 Other abnormalities of gait and mobility: Secondary | ICD-10-CM | POA: Diagnosis not present

## 2023-01-10 DIAGNOSIS — M4326 Fusion of spine, lumbar region: Secondary | ICD-10-CM | POA: Diagnosis not present

## 2023-01-10 DIAGNOSIS — M5459 Other low back pain: Secondary | ICD-10-CM | POA: Diagnosis not present

## 2023-01-10 DIAGNOSIS — M6389 Disorders of muscle in diseases classified elsewhere, multiple sites: Secondary | ICD-10-CM | POA: Diagnosis not present

## 2023-01-10 DIAGNOSIS — M5416 Radiculopathy, lumbar region: Secondary | ICD-10-CM | POA: Diagnosis not present

## 2023-01-17 DIAGNOSIS — M4326 Fusion of spine, lumbar region: Secondary | ICD-10-CM | POA: Diagnosis not present

## 2023-01-17 DIAGNOSIS — M5416 Radiculopathy, lumbar region: Secondary | ICD-10-CM | POA: Diagnosis not present

## 2023-01-17 DIAGNOSIS — R278 Other lack of coordination: Secondary | ICD-10-CM | POA: Diagnosis not present

## 2023-01-17 DIAGNOSIS — M6389 Disorders of muscle in diseases classified elsewhere, multiple sites: Secondary | ICD-10-CM | POA: Diagnosis not present

## 2023-01-17 DIAGNOSIS — R2689 Other abnormalities of gait and mobility: Secondary | ICD-10-CM | POA: Diagnosis not present

## 2023-01-17 DIAGNOSIS — M5459 Other low back pain: Secondary | ICD-10-CM | POA: Diagnosis not present

## 2023-02-01 ENCOUNTER — Other Ambulatory Visit (HOSPITAL_BASED_OUTPATIENT_CLINIC_OR_DEPARTMENT_OTHER): Payer: Self-pay

## 2023-02-01 DIAGNOSIS — M47818 Spondylosis without myelopathy or radiculopathy, sacral and sacrococcygeal region: Secondary | ICD-10-CM | POA: Diagnosis not present

## 2023-02-01 DIAGNOSIS — M961 Postlaminectomy syndrome, not elsewhere classified: Secondary | ICD-10-CM | POA: Diagnosis not present

## 2023-02-01 DIAGNOSIS — G894 Chronic pain syndrome: Secondary | ICD-10-CM | POA: Diagnosis not present

## 2023-02-01 DIAGNOSIS — M5416 Radiculopathy, lumbar region: Secondary | ICD-10-CM | POA: Diagnosis not present

## 2023-02-01 MED ORDER — OXYCODONE-ACETAMINOPHEN 10-325 MG PO TABS
1.0000 | ORAL_TABLET | ORAL | 0 refills | Status: DC | PRN
  Filled 2023-02-04: qty 180, 30d supply, fill #0

## 2023-02-04 ENCOUNTER — Other Ambulatory Visit: Payer: Self-pay

## 2023-02-04 ENCOUNTER — Other Ambulatory Visit (HOSPITAL_BASED_OUTPATIENT_CLINIC_OR_DEPARTMENT_OTHER): Payer: Self-pay

## 2023-03-03 ENCOUNTER — Other Ambulatory Visit (HOSPITAL_BASED_OUTPATIENT_CLINIC_OR_DEPARTMENT_OTHER): Payer: Self-pay

## 2023-03-03 DIAGNOSIS — M961 Postlaminectomy syndrome, not elsewhere classified: Secondary | ICD-10-CM | POA: Diagnosis not present

## 2023-03-03 DIAGNOSIS — M5416 Radiculopathy, lumbar region: Secondary | ICD-10-CM | POA: Diagnosis not present

## 2023-03-03 DIAGNOSIS — G894 Chronic pain syndrome: Secondary | ICD-10-CM | POA: Diagnosis not present

## 2023-03-03 DIAGNOSIS — M47818 Spondylosis without myelopathy or radiculopathy, sacral and sacrococcygeal region: Secondary | ICD-10-CM | POA: Diagnosis not present

## 2023-03-03 MED ORDER — GABAPENTIN 300 MG PO CAPS
300.0000 mg | ORAL_CAPSULE | Freq: Two times a day (BID) | ORAL | 2 refills | Status: DC
  Filled 2023-03-03: qty 60, 30d supply, fill #0
  Filled 2023-04-02: qty 60, 30d supply, fill #1
  Filled 2023-06-09: qty 60, 30d supply, fill #2

## 2023-03-03 MED ORDER — OXYCODONE-ACETAMINOPHEN 10-325 MG PO TABS
1.0000 | ORAL_TABLET | Freq: Three times a day (TID) | ORAL | 0 refills | Status: DC
  Filled 2023-03-08: qty 120, 40d supply, fill #0

## 2023-03-08 ENCOUNTER — Other Ambulatory Visit (HOSPITAL_BASED_OUTPATIENT_CLINIC_OR_DEPARTMENT_OTHER): Payer: Self-pay

## 2023-03-10 ENCOUNTER — Other Ambulatory Visit (HOSPITAL_BASED_OUTPATIENT_CLINIC_OR_DEPARTMENT_OTHER): Payer: Self-pay

## 2023-03-10 DIAGNOSIS — N390 Urinary tract infection, site not specified: Secondary | ICD-10-CM | POA: Diagnosis not present

## 2023-03-10 MED ORDER — CEPHALEXIN 250 MG PO CAPS
250.0000 mg | ORAL_CAPSULE | Freq: Two times a day (BID) | ORAL | 0 refills | Status: AC
  Filled 2023-03-10: qty 14, 7d supply, fill #0

## 2023-03-22 ENCOUNTER — Encounter: Payer: Self-pay | Admitting: Internal Medicine

## 2023-03-22 DIAGNOSIS — Z6829 Body mass index (BMI) 29.0-29.9, adult: Secondary | ICD-10-CM | POA: Diagnosis not present

## 2023-03-22 DIAGNOSIS — Z79899 Other long term (current) drug therapy: Secondary | ICD-10-CM | POA: Diagnosis not present

## 2023-03-22 DIAGNOSIS — M1991 Primary osteoarthritis, unspecified site: Secondary | ICD-10-CM | POA: Diagnosis not present

## 2023-03-22 DIAGNOSIS — L4059 Other psoriatic arthropathy: Secondary | ICD-10-CM | POA: Diagnosis not present

## 2023-03-22 DIAGNOSIS — M5136 Other intervertebral disc degeneration, lumbar region: Secondary | ICD-10-CM | POA: Diagnosis not present

## 2023-03-22 DIAGNOSIS — T148XXA Other injury of unspecified body region, initial encounter: Secondary | ICD-10-CM | POA: Diagnosis not present

## 2023-03-22 DIAGNOSIS — E663 Overweight: Secondary | ICD-10-CM | POA: Diagnosis not present

## 2023-03-22 DIAGNOSIS — M5030 Other cervical disc degeneration, unspecified cervical region: Secondary | ICD-10-CM | POA: Diagnosis not present

## 2023-03-23 ENCOUNTER — Other Ambulatory Visit: Payer: Self-pay

## 2023-03-25 DIAGNOSIS — M1711 Unilateral primary osteoarthritis, right knee: Secondary | ICD-10-CM | POA: Diagnosis not present

## 2023-03-29 ENCOUNTER — Encounter: Payer: Self-pay | Admitting: Internal Medicine

## 2023-03-31 DIAGNOSIS — R399 Unspecified symptoms and signs involving the genitourinary system: Secondary | ICD-10-CM | POA: Diagnosis not present

## 2023-04-04 DIAGNOSIS — M810 Age-related osteoporosis without current pathological fracture: Secondary | ICD-10-CM | POA: Diagnosis not present

## 2023-04-04 DIAGNOSIS — E785 Hyperlipidemia, unspecified: Secondary | ICD-10-CM | POA: Diagnosis not present

## 2023-04-04 DIAGNOSIS — R7989 Other specified abnormal findings of blood chemistry: Secondary | ICD-10-CM | POA: Diagnosis not present

## 2023-04-04 DIAGNOSIS — E039 Hypothyroidism, unspecified: Secondary | ICD-10-CM | POA: Diagnosis not present

## 2023-04-04 DIAGNOSIS — K219 Gastro-esophageal reflux disease without esophagitis: Secondary | ICD-10-CM | POA: Diagnosis not present

## 2023-04-04 DIAGNOSIS — I10 Essential (primary) hypertension: Secondary | ICD-10-CM | POA: Diagnosis not present

## 2023-04-14 ENCOUNTER — Ambulatory Visit (AMBULATORY_SURGERY_CENTER): Payer: Medicare Other

## 2023-04-14 VITALS — Ht 63.0 in | Wt 160.0 lb

## 2023-04-14 DIAGNOSIS — I10 Essential (primary) hypertension: Secondary | ICD-10-CM | POA: Diagnosis not present

## 2023-04-14 DIAGNOSIS — N3010 Interstitial cystitis (chronic) without hematuria: Secondary | ICD-10-CM | POA: Diagnosis not present

## 2023-04-14 DIAGNOSIS — I700 Atherosclerosis of aorta: Secondary | ICD-10-CM | POA: Diagnosis not present

## 2023-04-14 DIAGNOSIS — K589 Irritable bowel syndrome without diarrhea: Secondary | ICD-10-CM | POA: Diagnosis not present

## 2023-04-14 DIAGNOSIS — Z23 Encounter for immunization: Secondary | ICD-10-CM | POA: Diagnosis not present

## 2023-04-14 DIAGNOSIS — E039 Hypothyroidism, unspecified: Secondary | ICD-10-CM | POA: Diagnosis not present

## 2023-04-14 DIAGNOSIS — F3342 Major depressive disorder, recurrent, in full remission: Secondary | ICD-10-CM | POA: Diagnosis not present

## 2023-04-14 DIAGNOSIS — M797 Fibromyalgia: Secondary | ICD-10-CM | POA: Diagnosis not present

## 2023-04-14 DIAGNOSIS — Z1331 Encounter for screening for depression: Secondary | ICD-10-CM | POA: Diagnosis not present

## 2023-04-14 DIAGNOSIS — Z0000 Encounter for general adult medical examination without abnormal findings: Secondary | ICD-10-CM | POA: Diagnosis not present

## 2023-04-14 DIAGNOSIS — G720 Drug-induced myopathy: Secondary | ICD-10-CM | POA: Diagnosis not present

## 2023-04-14 DIAGNOSIS — M810 Age-related osteoporosis without current pathological fracture: Secondary | ICD-10-CM | POA: Diagnosis not present

## 2023-04-14 DIAGNOSIS — Z86010 Personal history of colonic polyps: Secondary | ICD-10-CM

## 2023-04-14 DIAGNOSIS — E785 Hyperlipidemia, unspecified: Secondary | ICD-10-CM | POA: Diagnosis not present

## 2023-04-14 DIAGNOSIS — J45909 Unspecified asthma, uncomplicated: Secondary | ICD-10-CM | POA: Diagnosis not present

## 2023-04-14 DIAGNOSIS — Z1339 Encounter for screening examination for other mental health and behavioral disorders: Secondary | ICD-10-CM | POA: Diagnosis not present

## 2023-04-14 MED ORDER — NA SULFATE-K SULFATE-MG SULF 17.5-3.13-1.6 GM/177ML PO SOLN
1.0000 | Freq: Once | ORAL | 0 refills | Status: AC
Start: 2023-04-14 — End: 2023-04-14

## 2023-04-14 NOTE — Progress Notes (Signed)
No egg or soy allergy known to patient   Has issues known to pt with past sedation with surgeries or procedures (Hx of seizure in 1990s and states nausea)  Patient denies ever being told they had issues or difficulty with intubation   No FH of Malignant Hyperthermia  Pt is not on diet pills  Pt is not on  home 02   Pt is not on blood thinners   Pt denies issues with constipation   No A fib or A flutter  Have any cardiac testing pending--no  Pt instructed to use Single[employer].com or GoodRx for a price reduction on prep    Patient's chart reviewed by Cathlyn Parsons CRNA prior to previsit and patient appropriate for the [facility].  Previsit completed and red dot placed by patient's name on their procedure day (on provider's schedule).

## 2023-04-19 ENCOUNTER — Other Ambulatory Visit: Payer: Self-pay | Admitting: Internal Medicine

## 2023-04-19 DIAGNOSIS — Z1231 Encounter for screening mammogram for malignant neoplasm of breast: Secondary | ICD-10-CM

## 2023-04-20 DIAGNOSIS — N3010 Interstitial cystitis (chronic) without hematuria: Secondary | ICD-10-CM | POA: Diagnosis not present

## 2023-04-26 DIAGNOSIS — M7541 Impingement syndrome of right shoulder: Secondary | ICD-10-CM | POA: Diagnosis not present

## 2023-04-26 DIAGNOSIS — M25512 Pain in left shoulder: Secondary | ICD-10-CM | POA: Diagnosis not present

## 2023-04-26 DIAGNOSIS — M25511 Pain in right shoulder: Secondary | ICD-10-CM | POA: Diagnosis not present

## 2023-04-27 ENCOUNTER — Other Ambulatory Visit (HOSPITAL_BASED_OUTPATIENT_CLINIC_OR_DEPARTMENT_OTHER): Payer: Self-pay | Admitting: Orthopedic Surgery

## 2023-04-27 DIAGNOSIS — M4807 Spinal stenosis, lumbosacral region: Secondary | ICD-10-CM

## 2023-04-27 DIAGNOSIS — M5416 Radiculopathy, lumbar region: Secondary | ICD-10-CM

## 2023-05-02 DIAGNOSIS — R399 Unspecified symptoms and signs involving the genitourinary system: Secondary | ICD-10-CM | POA: Diagnosis not present

## 2023-05-03 ENCOUNTER — Encounter: Payer: Medicare Other | Admitting: Internal Medicine

## 2023-05-05 ENCOUNTER — Other Ambulatory Visit (HOSPITAL_BASED_OUTPATIENT_CLINIC_OR_DEPARTMENT_OTHER): Payer: Self-pay

## 2023-05-05 DIAGNOSIS — G894 Chronic pain syndrome: Secondary | ICD-10-CM | POA: Diagnosis not present

## 2023-05-05 DIAGNOSIS — M5416 Radiculopathy, lumbar region: Secondary | ICD-10-CM | POA: Diagnosis not present

## 2023-05-05 DIAGNOSIS — M47818 Spondylosis without myelopathy or radiculopathy, sacral and sacrococcygeal region: Secondary | ICD-10-CM | POA: Diagnosis not present

## 2023-05-05 DIAGNOSIS — M961 Postlaminectomy syndrome, not elsewhere classified: Secondary | ICD-10-CM | POA: Diagnosis not present

## 2023-05-05 MED ORDER — OXYCODONE-ACETAMINOPHEN 7.5-325 MG PO TABS
1.0000 | ORAL_TABLET | Freq: Three times a day (TID) | ORAL | 0 refills | Status: DC | PRN
  Filled 2023-05-05: qty 90, 30d supply, fill #0

## 2023-05-05 MED ORDER — OXYCODONE-ACETAMINOPHEN 7.5-325 MG PO TABS: ORAL_TABLET | ORAL | 0 refills | Status: DC

## 2023-05-06 DIAGNOSIS — H401131 Primary open-angle glaucoma, bilateral, mild stage: Secondary | ICD-10-CM | POA: Diagnosis not present

## 2023-05-12 DIAGNOSIS — N390 Urinary tract infection, site not specified: Secondary | ICD-10-CM | POA: Diagnosis not present

## 2023-05-16 ENCOUNTER — Other Ambulatory Visit (HOSPITAL_BASED_OUTPATIENT_CLINIC_OR_DEPARTMENT_OTHER): Payer: Self-pay | Admitting: Student

## 2023-05-16 DIAGNOSIS — M2022 Hallux rigidus, left foot: Secondary | ICD-10-CM

## 2023-05-17 ENCOUNTER — Other Ambulatory Visit (HOSPITAL_BASED_OUTPATIENT_CLINIC_OR_DEPARTMENT_OTHER): Payer: Self-pay | Admitting: Internal Medicine

## 2023-05-17 DIAGNOSIS — Z1231 Encounter for screening mammogram for malignant neoplasm of breast: Secondary | ICD-10-CM

## 2023-05-20 ENCOUNTER — Ambulatory Visit: Payer: Medicare Other

## 2023-05-22 ENCOUNTER — Ambulatory Visit (HOSPITAL_BASED_OUTPATIENT_CLINIC_OR_DEPARTMENT_OTHER): Payer: Medicare Other

## 2023-05-22 ENCOUNTER — Ambulatory Visit (HOSPITAL_BASED_OUTPATIENT_CLINIC_OR_DEPARTMENT_OTHER): Payer: Medicare Other | Admitting: Radiology

## 2023-05-28 ENCOUNTER — Other Ambulatory Visit: Payer: Self-pay | Admitting: Cardiology

## 2023-05-30 ENCOUNTER — Other Ambulatory Visit (HOSPITAL_COMMUNITY): Payer: Self-pay

## 2023-05-30 ENCOUNTER — Other Ambulatory Visit (HOSPITAL_BASED_OUTPATIENT_CLINIC_OR_DEPARTMENT_OTHER): Payer: Self-pay

## 2023-05-30 MED ORDER — OXYCODONE-ACETAMINOPHEN 7.5-325 MG PO TABS
1.0000 | ORAL_TABLET | Freq: Three times a day (TID) | ORAL | 0 refills | Status: DC
  Filled 2023-05-30: qty 90, 30d supply, fill #0

## 2023-06-05 ENCOUNTER — Ambulatory Visit (HOSPITAL_BASED_OUTPATIENT_CLINIC_OR_DEPARTMENT_OTHER)
Admission: RE | Admit: 2023-06-05 | Discharge: 2023-06-05 | Disposition: A | Discharge status: Home / Self Care | Payer: Medicare Other | Source: Ambulatory Visit | Attending: Student | Admitting: Student

## 2023-06-05 ENCOUNTER — Ambulatory Visit (HOSPITAL_BASED_OUTPATIENT_CLINIC_OR_DEPARTMENT_OTHER)
Admission: RE | Admit: 2023-06-05 | Discharge: 2023-06-05 | Disposition: A | Discharge status: Home / Self Care | Payer: Medicare Other | Source: Ambulatory Visit | Attending: Internal Medicine | Admitting: Internal Medicine

## 2023-06-05 DIAGNOSIS — Z1231 Encounter for screening mammogram for malignant neoplasm of breast: Secondary | ICD-10-CM | POA: Insufficient documentation

## 2023-06-05 DIAGNOSIS — M2022 Hallux rigidus, left foot: Secondary | ICD-10-CM | POA: Diagnosis not present

## 2023-06-05 DIAGNOSIS — M19072 Primary osteoarthritis, left ankle and foot: Secondary | ICD-10-CM | POA: Diagnosis not present

## 2023-06-05 DIAGNOSIS — M79672 Pain in left foot: Secondary | ICD-10-CM | POA: Diagnosis not present

## 2023-06-09 ENCOUNTER — Other Ambulatory Visit: Payer: Self-pay | Admitting: Cardiology

## 2023-06-16 DIAGNOSIS — N390 Urinary tract infection, site not specified: Secondary | ICD-10-CM | POA: Diagnosis not present

## 2023-06-17 ENCOUNTER — Other Ambulatory Visit (HOSPITAL_BASED_OUTPATIENT_CLINIC_OR_DEPARTMENT_OTHER): Payer: Self-pay

## 2023-06-17 MED ORDER — CEPHALEXIN 250 MG PO CAPS
250.0000 mg | ORAL_CAPSULE | Freq: Three times a day (TID) | ORAL | 0 refills | Status: DC
  Filled 2023-06-17: qty 21, 7d supply, fill #0

## 2023-06-24 ENCOUNTER — Encounter: Payer: Medicare Other | Admitting: Internal Medicine

## 2023-06-27 DIAGNOSIS — T8484XA Pain due to internal orthopedic prosthetic devices, implants and grafts, initial encounter: Secondary | ICD-10-CM | POA: Diagnosis not present

## 2023-06-28 DIAGNOSIS — L4059 Other psoriatic arthropathy: Secondary | ICD-10-CM | POA: Diagnosis not present

## 2023-07-04 ENCOUNTER — Telehealth: Payer: Self-pay | Admitting: Internal Medicine

## 2023-07-04 NOTE — Telephone Encounter (Signed)
Inbound call from patient requesting updated prep instructions for 8/21 colonoscopy sent through mychart. Please advise, thank you.

## 2023-07-04 NOTE — Telephone Encounter (Signed)
Thank you :)

## 2023-07-04 NOTE — Telephone Encounter (Signed)
Updated instructions sent to pt via mychart.

## 2023-07-05 DIAGNOSIS — T8484XA Pain due to internal orthopedic prosthetic devices, implants and grafts, initial encounter: Secondary | ICD-10-CM | POA: Diagnosis not present

## 2023-07-07 ENCOUNTER — Other Ambulatory Visit (HOSPITAL_BASED_OUTPATIENT_CLINIC_OR_DEPARTMENT_OTHER): Payer: Self-pay

## 2023-07-07 DIAGNOSIS — M5416 Radiculopathy, lumbar region: Secondary | ICD-10-CM | POA: Diagnosis not present

## 2023-07-07 DIAGNOSIS — M47818 Spondylosis without myelopathy or radiculopathy, sacral and sacrococcygeal region: Secondary | ICD-10-CM | POA: Diagnosis not present

## 2023-07-07 DIAGNOSIS — M961 Postlaminectomy syndrome, not elsewhere classified: Secondary | ICD-10-CM | POA: Diagnosis not present

## 2023-07-07 DIAGNOSIS — G894 Chronic pain syndrome: Secondary | ICD-10-CM | POA: Diagnosis not present

## 2023-07-07 MED ORDER — OXYCODONE-ACETAMINOPHEN 5-325 MG PO TABS
1.0000 | ORAL_TABLET | Freq: Three times a day (TID) | ORAL | 0 refills | Status: DC | PRN
  Filled 2023-07-07: qty 90, 30d supply, fill #0

## 2023-07-10 DIAGNOSIS — N3000 Acute cystitis without hematuria: Secondary | ICD-10-CM | POA: Diagnosis not present

## 2023-07-10 DIAGNOSIS — R300 Dysuria: Secondary | ICD-10-CM | POA: Diagnosis not present

## 2023-07-13 ENCOUNTER — Encounter: Payer: Medicare Other | Admitting: Internal Medicine

## 2023-07-17 ENCOUNTER — Other Ambulatory Visit (HOSPITAL_BASED_OUTPATIENT_CLINIC_OR_DEPARTMENT_OTHER): Payer: Self-pay

## 2023-07-18 ENCOUNTER — Other Ambulatory Visit (HOSPITAL_BASED_OUTPATIENT_CLINIC_OR_DEPARTMENT_OTHER): Payer: Self-pay

## 2023-07-18 MED ORDER — GABAPENTIN 300 MG PO CAPS
300.0000 mg | ORAL_CAPSULE | Freq: Two times a day (BID) | ORAL | 2 refills | Status: DC
  Filled 2023-07-18: qty 60, 30d supply, fill #0
  Filled 2023-08-14: qty 60, 30d supply, fill #1
  Filled 2023-09-13: qty 60, 30d supply, fill #2

## 2023-07-22 ENCOUNTER — Other Ambulatory Visit (HOSPITAL_BASED_OUTPATIENT_CLINIC_OR_DEPARTMENT_OTHER): Payer: Self-pay

## 2023-07-29 DIAGNOSIS — M1711 Unilateral primary osteoarthritis, right knee: Secondary | ICD-10-CM | POA: Diagnosis not present

## 2023-08-03 ENCOUNTER — Other Ambulatory Visit (HOSPITAL_BASED_OUTPATIENT_CLINIC_OR_DEPARTMENT_OTHER): Payer: Self-pay

## 2023-08-03 MED ORDER — OXYCODONE-ACETAMINOPHEN 5-325 MG PO TABS
1.0000 | ORAL_TABLET | Freq: Three times a day (TID) | ORAL | 0 refills | Status: DC | PRN
  Filled 2023-08-03 – 2023-08-04 (×2): qty 90, 30d supply, fill #0

## 2023-08-04 ENCOUNTER — Other Ambulatory Visit (HOSPITAL_BASED_OUTPATIENT_CLINIC_OR_DEPARTMENT_OTHER): Payer: Self-pay

## 2023-08-09 ENCOUNTER — Ambulatory Visit (HOSPITAL_BASED_OUTPATIENT_CLINIC_OR_DEPARTMENT_OTHER)
Admission: RE | Admit: 2023-08-09 | Discharge: 2023-08-09 | Disposition: A | Discharge status: Home / Self Care | Payer: Medicare Other | Source: Ambulatory Visit | Attending: Orthopedic Surgery | Admitting: Orthopedic Surgery

## 2023-08-09 DIAGNOSIS — M4807 Spinal stenosis, lumbosacral region: Secondary | ICD-10-CM | POA: Insufficient documentation

## 2023-08-09 DIAGNOSIS — M5116 Intervertebral disc disorders with radiculopathy, lumbar region: Secondary | ICD-10-CM | POA: Diagnosis not present

## 2023-08-09 DIAGNOSIS — Z981 Arthrodesis status: Secondary | ICD-10-CM | POA: Diagnosis not present

## 2023-08-09 DIAGNOSIS — M4726 Other spondylosis with radiculopathy, lumbar region: Secondary | ICD-10-CM | POA: Diagnosis not present

## 2023-08-09 DIAGNOSIS — I700 Atherosclerosis of aorta: Secondary | ICD-10-CM | POA: Diagnosis not present

## 2023-08-09 DIAGNOSIS — M5416 Radiculopathy, lumbar region: Secondary | ICD-10-CM | POA: Diagnosis not present

## 2023-08-09 DIAGNOSIS — N390 Urinary tract infection, site not specified: Secondary | ICD-10-CM | POA: Diagnosis not present

## 2023-08-17 DIAGNOSIS — L4050 Arthropathic psoriasis, unspecified: Secondary | ICD-10-CM | POA: Diagnosis not present

## 2023-08-17 DIAGNOSIS — L409 Psoriasis, unspecified: Secondary | ICD-10-CM | POA: Diagnosis not present

## 2023-08-17 DIAGNOSIS — Z79899 Other long term (current) drug therapy: Secondary | ICD-10-CM | POA: Diagnosis not present

## 2023-08-29 DIAGNOSIS — N390 Urinary tract infection, site not specified: Secondary | ICD-10-CM | POA: Diagnosis not present

## 2023-08-29 DIAGNOSIS — R399 Unspecified symptoms and signs involving the genitourinary system: Secondary | ICD-10-CM | POA: Diagnosis not present

## 2023-08-29 DIAGNOSIS — B9620 Unspecified Escherichia coli [E. coli] as the cause of diseases classified elsewhere: Secondary | ICD-10-CM | POA: Diagnosis not present

## 2023-08-29 DIAGNOSIS — N3010 Interstitial cystitis (chronic) without hematuria: Secondary | ICD-10-CM | POA: Diagnosis not present

## 2023-08-30 ENCOUNTER — Encounter: Payer: Self-pay | Admitting: Internal Medicine

## 2023-09-01 ENCOUNTER — Other Ambulatory Visit (HOSPITAL_BASED_OUTPATIENT_CLINIC_OR_DEPARTMENT_OTHER): Payer: Self-pay

## 2023-09-01 DIAGNOSIS — R399 Unspecified symptoms and signs involving the genitourinary system: Secondary | ICD-10-CM | POA: Diagnosis not present

## 2023-09-01 DIAGNOSIS — N390 Urinary tract infection, site not specified: Secondary | ICD-10-CM | POA: Diagnosis not present

## 2023-09-01 DIAGNOSIS — R3989 Other symptoms and signs involving the genitourinary system: Secondary | ICD-10-CM | POA: Diagnosis not present

## 2023-09-01 MED ORDER — OXYCODONE-ACETAMINOPHEN 5-325 MG PO TABS
1.0000 | ORAL_TABLET | Freq: Three times a day (TID) | ORAL | 0 refills | Status: DC | PRN
  Filled 2023-09-01: qty 90, 30d supply, fill #0

## 2023-09-03 DIAGNOSIS — Z23 Encounter for immunization: Secondary | ICD-10-CM | POA: Diagnosis not present

## 2023-09-05 ENCOUNTER — Encounter: Payer: Medicare Other | Admitting: Internal Medicine

## 2023-09-10 ENCOUNTER — Other Ambulatory Visit (HOSPITAL_BASED_OUTPATIENT_CLINIC_OR_DEPARTMENT_OTHER): Payer: Self-pay | Admitting: Physician Assistant

## 2023-09-10 DIAGNOSIS — N281 Cyst of kidney, acquired: Secondary | ICD-10-CM

## 2023-09-13 ENCOUNTER — Other Ambulatory Visit (HOSPITAL_BASED_OUTPATIENT_CLINIC_OR_DEPARTMENT_OTHER): Payer: Self-pay

## 2023-09-13 DIAGNOSIS — M961 Postlaminectomy syndrome, not elsewhere classified: Secondary | ICD-10-CM | POA: Diagnosis not present

## 2023-09-13 DIAGNOSIS — M47818 Spondylosis without myelopathy or radiculopathy, sacral and sacrococcygeal region: Secondary | ICD-10-CM | POA: Diagnosis not present

## 2023-09-13 DIAGNOSIS — M5416 Radiculopathy, lumbar region: Secondary | ICD-10-CM | POA: Diagnosis not present

## 2023-09-13 DIAGNOSIS — G894 Chronic pain syndrome: Secondary | ICD-10-CM | POA: Diagnosis not present

## 2023-09-13 MED ORDER — OXYCODONE-ACETAMINOPHEN 5-325 MG PO TABS: 1.0000 | ORAL_TABLET | Freq: Two times a day (BID) | ORAL | 0 refills | Status: DC | PRN

## 2023-09-13 MED ORDER — OXYCODONE-ACETAMINOPHEN 5-325 MG PO TABS
1.0000 | ORAL_TABLET | Freq: Two times a day (BID) | ORAL | 0 refills | Status: DC | PRN
  Filled 2023-10-10: qty 60, 30d supply, fill #0

## 2023-09-13 MED ORDER — GABAPENTIN 300 MG PO CAPS
300.0000 mg | ORAL_CAPSULE | Freq: Two times a day (BID) | ORAL | 2 refills | Status: DC
  Filled 2023-09-13 – 2024-07-12 (×2): qty 60, 30d supply, fill #0

## 2023-09-15 ENCOUNTER — Other Ambulatory Visit (HOSPITAL_BASED_OUTPATIENT_CLINIC_OR_DEPARTMENT_OTHER): Payer: Self-pay

## 2023-09-15 DIAGNOSIS — Z23 Encounter for immunization: Secondary | ICD-10-CM | POA: Diagnosis not present

## 2023-09-15 MED ORDER — COMIRNATY 30 MCG/0.3ML IM SUSY
0.3000 mL | PREFILLED_SYRINGE | Freq: Once | INTRAMUSCULAR | 0 refills | Status: AC
  Filled 2023-09-15: qty 0.3, 1d supply, fill #0

## 2023-09-19 DIAGNOSIS — M1991 Primary osteoarthritis, unspecified site: Secondary | ICD-10-CM | POA: Diagnosis not present

## 2023-09-19 DIAGNOSIS — L4059 Other psoriatic arthropathy: Secondary | ICD-10-CM | POA: Diagnosis not present

## 2023-09-19 DIAGNOSIS — M5136 Other intervertebral disc degeneration, lumbar region with discogenic back pain only: Secondary | ICD-10-CM | POA: Diagnosis not present

## 2023-09-19 DIAGNOSIS — Z79899 Other long term (current) drug therapy: Secondary | ICD-10-CM | POA: Diagnosis not present

## 2023-09-19 DIAGNOSIS — L409 Psoriasis, unspecified: Secondary | ICD-10-CM | POA: Diagnosis not present

## 2023-09-19 DIAGNOSIS — M5030 Other cervical disc degeneration, unspecified cervical region: Secondary | ICD-10-CM | POA: Diagnosis not present

## 2023-09-30 ENCOUNTER — Ambulatory Visit (HOSPITAL_BASED_OUTPATIENT_CLINIC_OR_DEPARTMENT_OTHER): Admission: RE | Admit: 2023-09-30 | Payer: Medicare Other | Source: Ambulatory Visit

## 2023-10-07 ENCOUNTER — Ambulatory Visit (HOSPITAL_BASED_OUTPATIENT_CLINIC_OR_DEPARTMENT_OTHER)
Admission: RE | Admit: 2023-10-07 | Discharge: 2023-10-07 | Disposition: A | Discharge status: Home / Self Care | Payer: Medicare Other | Source: Ambulatory Visit | Attending: Physician Assistant | Admitting: Physician Assistant

## 2023-10-07 DIAGNOSIS — H903 Sensorineural hearing loss, bilateral: Secondary | ICD-10-CM | POA: Diagnosis not present

## 2023-10-07 DIAGNOSIS — N281 Cyst of kidney, acquired: Secondary | ICD-10-CM | POA: Diagnosis not present

## 2023-10-07 MED ORDER — GADOBUTROL 1 MMOL/ML IV SOLN
7.2000 mL | Freq: Once | INTRAVENOUS | Status: AC | PRN
  Administered 2023-10-07: 7.2 mL via INTRAVENOUS
  Filled 2023-10-07: qty 7.5

## 2023-10-10 ENCOUNTER — Other Ambulatory Visit (HOSPITAL_BASED_OUTPATIENT_CLINIC_OR_DEPARTMENT_OTHER): Payer: Self-pay

## 2023-10-10 MED ORDER — PAXLOVID (300/100) 20 X 150 MG & 10 X 100MG PO TBPK
3.0000 | ORAL_TABLET | Freq: Two times a day (BID) | ORAL | 0 refills | Status: AC
  Filled 2023-10-10: qty 30, 5d supply, fill #0

## 2023-10-13 ENCOUNTER — Other Ambulatory Visit (HOSPITAL_BASED_OUTPATIENT_CLINIC_OR_DEPARTMENT_OTHER): Payer: Self-pay

## 2023-10-13 MED ORDER — DULOXETINE HCL 60 MG PO CPEP
60.0000 mg | ORAL_CAPSULE | Freq: Every day | ORAL | 11 refills | Status: AC
  Filled 2023-10-13: qty 30, 30d supply, fill #0
  Filled 2023-11-09: qty 30, 30d supply, fill #1
  Filled 2023-12-09: qty 30, 30d supply, fill #2
  Filled 2024-01-09: qty 30, 30d supply, fill #3
  Filled 2024-02-14: qty 30, 30d supply, fill #4
  Filled 2024-03-05: qty 7, 7d supply, fill #5
  Filled 2024-03-15: qty 7, 7d supply, fill #6
  Filled 2024-03-28: qty 7, 7d supply, fill #7

## 2023-10-17 ENCOUNTER — Other Ambulatory Visit (HOSPITAL_BASED_OUTPATIENT_CLINIC_OR_DEPARTMENT_OTHER): Payer: Self-pay

## 2023-10-17 MED ORDER — GABAPENTIN 300 MG PO CAPS
300.0000 mg | ORAL_CAPSULE | Freq: Two times a day (BID) | ORAL | 2 refills | Status: DC
  Filled 2023-10-17 (×2): qty 60, 30d supply, fill #0
  Filled 2023-11-11: qty 60, 30d supply, fill #1
  Filled 2023-12-12: qty 60, 30d supply, fill #2

## 2023-10-24 DIAGNOSIS — N281 Cyst of kidney, acquired: Secondary | ICD-10-CM | POA: Diagnosis not present

## 2023-10-24 DIAGNOSIS — N3010 Interstitial cystitis (chronic) without hematuria: Secondary | ICD-10-CM | POA: Diagnosis not present

## 2023-10-31 DIAGNOSIS — T8484XA Pain due to internal orthopedic prosthetic devices, implants and grafts, initial encounter: Secondary | ICD-10-CM | POA: Diagnosis not present

## 2023-10-31 DIAGNOSIS — M79672 Pain in left foot: Secondary | ICD-10-CM | POA: Diagnosis not present

## 2023-11-02 DIAGNOSIS — N3000 Acute cystitis without hematuria: Secondary | ICD-10-CM | POA: Diagnosis not present

## 2023-11-02 DIAGNOSIS — B9789 Other viral agents as the cause of diseases classified elsewhere: Secondary | ICD-10-CM | POA: Diagnosis not present

## 2023-11-03 DIAGNOSIS — H401132 Primary open-angle glaucoma, bilateral, moderate stage: Secondary | ICD-10-CM | POA: Diagnosis not present

## 2023-11-03 DIAGNOSIS — H35372 Puckering of macula, left eye: Secondary | ICD-10-CM | POA: Diagnosis not present

## 2023-11-03 DIAGNOSIS — H353131 Nonexudative age-related macular degeneration, bilateral, early dry stage: Secondary | ICD-10-CM | POA: Diagnosis not present

## 2023-11-03 DIAGNOSIS — Z961 Presence of intraocular lens: Secondary | ICD-10-CM | POA: Diagnosis not present

## 2023-11-03 DIAGNOSIS — H524 Presbyopia: Secondary | ICD-10-CM | POA: Diagnosis not present

## 2023-11-04 DIAGNOSIS — M170 Bilateral primary osteoarthritis of knee: Secondary | ICD-10-CM | POA: Diagnosis not present

## 2023-11-10 ENCOUNTER — Other Ambulatory Visit (HOSPITAL_BASED_OUTPATIENT_CLINIC_OR_DEPARTMENT_OTHER): Payer: Self-pay

## 2023-11-10 DIAGNOSIS — Z79891 Long term (current) use of opiate analgesic: Secondary | ICD-10-CM | POA: Diagnosis not present

## 2023-11-10 DIAGNOSIS — M47818 Spondylosis without myelopathy or radiculopathy, sacral and sacrococcygeal region: Secondary | ICD-10-CM | POA: Diagnosis not present

## 2023-11-10 DIAGNOSIS — M5416 Radiculopathy, lumbar region: Secondary | ICD-10-CM | POA: Diagnosis not present

## 2023-11-10 DIAGNOSIS — M961 Postlaminectomy syndrome, not elsewhere classified: Secondary | ICD-10-CM | POA: Diagnosis not present

## 2023-11-10 DIAGNOSIS — G894 Chronic pain syndrome: Secondary | ICD-10-CM | POA: Diagnosis not present

## 2023-11-10 MED ORDER — OXYCODONE-ACETAMINOPHEN 5-325 MG PO TABS: 1.0000 | ORAL_TABLET | Freq: Two times a day (BID) | ORAL | 0 refills | Status: DC | PRN

## 2023-11-10 MED ORDER — OXYCODONE-ACETAMINOPHEN 5-325 MG PO TABS
1.0000 | ORAL_TABLET | Freq: Two times a day (BID) | ORAL | 0 refills | Status: DC | PRN
  Filled 2023-11-10: qty 60, 30d supply, fill #0

## 2023-11-10 MED ORDER — DICLOFENAC SODIUM 1 % EX GEL
4.0000 g | Freq: Four times a day (QID) | CUTANEOUS | 5 refills | Status: DC
  Filled 2023-11-10: qty 1000, 15d supply, fill #0

## 2023-11-11 DIAGNOSIS — Z79891 Long term (current) use of opiate analgesic: Secondary | ICD-10-CM | POA: Diagnosis not present

## 2023-11-11 DIAGNOSIS — G894 Chronic pain syndrome: Secondary | ICD-10-CM | POA: Diagnosis not present

## 2023-11-14 ENCOUNTER — Ambulatory Visit (AMBULATORY_SURGERY_CENTER): Payer: Medicare Other

## 2023-11-14 VITALS — Ht 63.0 in | Wt 164.0 lb

## 2023-11-14 DIAGNOSIS — Z860100 Personal history of colon polyps, unspecified: Secondary | ICD-10-CM

## 2023-11-14 NOTE — Progress Notes (Signed)
No egg or soy allergy known to patient  No issues known to pt with past sedation with any surgeries or procedures Patient denies ever being told they had issues or difficulty with intubation  No FH of Malignant Hyperthermia Pt is not on diet pills Pt is not on  home 02  Pt is not on blood thinners  Pt denies issues with constipation  No A fib or A flutter Have any cardiac testing pending-- no  LOA: independent  Prep: suprep continue miralax   Patient's chart reviewed by Cathlyn Parsons CNRA prior to previsit and patient appropriate for the [facility].  Previsit completed and red dot placed by patient's name on their procedure day (on provider's schedule).     PV competed with patient. Prep instructions sent via mychart and home address. Pt already has prep solution from prior [facility] apt

## 2023-11-22 DIAGNOSIS — N390 Urinary tract infection, site not specified: Secondary | ICD-10-CM | POA: Diagnosis not present

## 2023-11-22 DIAGNOSIS — N952 Postmenopausal atrophic vaginitis: Secondary | ICD-10-CM | POA: Diagnosis not present

## 2023-11-24 ENCOUNTER — Telehealth: Payer: Self-pay | Admitting: Internal Medicine

## 2023-11-24 NOTE — Telephone Encounter (Signed)
 Inbound call from patient stating that she is scheduled for a colonoscopy on 1/9 with Dr. Marina Goodell and is requesting a call from nurse to discuss questions she has regarding her procedure. Please advise.

## 2023-11-25 ENCOUNTER — Other Ambulatory Visit (HOSPITAL_BASED_OUTPATIENT_CLINIC_OR_DEPARTMENT_OTHER): Payer: Self-pay

## 2023-11-25 MED ORDER — ZOSTER VAC RECOMB ADJUVANTED 50 MCG/0.5ML IM SUSR
0.5000 mL | Freq: Once | INTRAMUSCULAR | 1 refills | Status: AC
  Filled 2023-11-25: qty 0.5, 1d supply, fill #0
  Filled 2024-02-15: qty 0.5, 1d supply, fill #1

## 2023-11-25 NOTE — Telephone Encounter (Signed)
 Spoke with patient - all questions answered

## 2023-12-01 ENCOUNTER — Encounter: Payer: Self-pay | Admitting: Internal Medicine

## 2023-12-01 ENCOUNTER — Ambulatory Visit (AMBULATORY_SURGERY_CENTER): Payer: Medicare Other | Admitting: Internal Medicine

## 2023-12-01 VITALS — BP 174/92 | HR 70 | Temp 97.3°F | Resp 16 | Ht 63.0 in | Wt 164.0 lb

## 2023-12-01 DIAGNOSIS — K635 Polyp of colon: Secondary | ICD-10-CM | POA: Diagnosis not present

## 2023-12-01 DIAGNOSIS — Z1211 Encounter for screening for malignant neoplasm of colon: Secondary | ICD-10-CM | POA: Diagnosis not present

## 2023-12-01 DIAGNOSIS — Z860100 Personal history of colon polyps, unspecified: Secondary | ICD-10-CM

## 2023-12-01 DIAGNOSIS — K648 Other hemorrhoids: Secondary | ICD-10-CM | POA: Diagnosis not present

## 2023-12-01 DIAGNOSIS — D122 Benign neoplasm of ascending colon: Secondary | ICD-10-CM

## 2023-12-01 DIAGNOSIS — Z860101 Personal history of adenomatous and serrated colon polyps: Secondary | ICD-10-CM | POA: Diagnosis not present

## 2023-12-01 DIAGNOSIS — E039 Hypothyroidism, unspecified: Secondary | ICD-10-CM | POA: Diagnosis not present

## 2023-12-01 DIAGNOSIS — I10 Essential (primary) hypertension: Secondary | ICD-10-CM | POA: Diagnosis not present

## 2023-12-01 DIAGNOSIS — D123 Benign neoplasm of transverse colon: Secondary | ICD-10-CM

## 2023-12-01 DIAGNOSIS — F419 Anxiety disorder, unspecified: Secondary | ICD-10-CM | POA: Diagnosis not present

## 2023-12-01 DIAGNOSIS — F32A Depression, unspecified: Secondary | ICD-10-CM | POA: Diagnosis not present

## 2023-12-01 MED ORDER — SODIUM CHLORIDE 0.9 % IV SOLN: 500.0000 mL | Freq: Once | INTRAVENOUS | Status: DC

## 2023-12-01 NOTE — Progress Notes (Signed)
 Vss nad trans to pacu

## 2023-12-01 NOTE — Patient Instructions (Addendum)
-  Handout on polyps, hemorrhoids provided -await pathology results -repeat colonoscopy is not recommended for surveillance.  -Continue present medications   YOU HAD AN ENDOSCOPIC PROCEDURE TODAY AT THE [facility]:   Refer to the procedure report that was given to you for any specific questions about what was found during the examination.  If the procedure report does not answer your questions, please call your gastroenterologist to clarify.  If you requested that your care partner not be given the details of your procedure findings, then the procedure report has been included in a sealed envelope for you to review at your convenience later.  YOU SHOULD EXPECT: Some feelings of bloating in the abdomen. Passage of more gas than usual.  Walking can help get rid of the air that was put into your GI tract during the procedure and reduce the bloating. If you had a lower endoscopy (such as a colonoscopy or flexible sigmoidoscopy) you may notice spotting of blood in your stool or on the toilet paper. If you underwent a bowel prep for your procedure, you may not have a normal bowel movement for a few days.  Please Note:  You might notice some irritation and congestion in your nose or some drainage.  This is from the oxygen used during your procedure.  There is no need for concern and it should clear up in a day or so.  SYMPTOMS TO REPORT IMMEDIATELY:  Following lower endoscopy (colonoscopy or flexible sigmoidoscopy):  Excessive amounts of blood in the stool  Significant tenderness or worsening of abdominal pains  Swelling of the abdomen that is new, acute  Fever of 100F or higher   For urgent or emergent issues, a gastroenterologist can be reached at any hour by calling [phone]. Do not use MyChart messaging for urgent concerns.    DIET:  We do recommend a small meal at first, but then you may proceed to your regular diet.  Drink plenty of fluids but you should avoid alcoholic  beverages for 24 hours.  ACTIVITY:  You should plan to take it easy for the rest of today and you should NOT DRIVE or use heavy machinery until tomorrow (because of the sedation medicines used during the test).    FOLLOW UP: Our staff will call the number listed on your records the next business day following your procedure.  We will call around 7:15- 8:00 am to check on you and address any questions or concerns that you may have regarding the information given to you following your procedure. If we do not reach you, we will leave a message.     If any biopsies were taken you will be contacted by phone or by letter within the next 1-3 weeks.  Please call us  at [phone] if you have not heard about the biopsies in 3 weeks.    SIGNATURES/CONFIDENTIALITY: You and/or your care partner have signed paperwork which will be entered into your electronic medical record.  These signatures attest to the fact that that the information above on your After Visit Summary has been reviewed and is understood.  Full responsibility of the confidentiality of this discharge information lies with you and/or your care-partner.

## 2023-12-01 NOTE — Progress Notes (Signed)
 Pt's states no medical or surgical changes since previsit or office visit.

## 2023-12-01 NOTE — Progress Notes (Signed)
 HISTORY OF PRESENT ILLNESS:  April Berg is a 78 y.o. female with a history of multiple adenomatous colon polyps.  Presents today for surveillance colonoscopy.  REVIEW OF SYSTEMS:  All non-GI ROS negative except for  Past Medical History:  Diagnosis Date   Allergy    seasonal and environmental   Anxiety    Arthritis    osteoarthritis   Cataract    Complication of anesthesia 90's   block for elbow surgery and pt had a seizure... surgery since with no problem    Depression    GERD (gastroesophageal reflux disease)    Glaucoma of both eyes    H/O hiatal hernia    History of pericarditis    DEC 2008 AND SMALL PERICARDIAL EFFUSION--   RESOLVED   Hyperlipidemia    Hypertension    Hypothyroidism    IBS (irritable bowel syndrome)    Interstitial cystitis    Migraines    Osteoporosis    Pelvic pain    Seizures (HCC)    one occurence in the 1990's after anesthesia, no problems since   Spinal stenosis    Vitamin D  deficiency     Past Surgical History:  Procedure Laterality Date   ANTERIOR CERVICAL DECOMP/DISCECTOMY FUSION  12/08/2011   Procedure: ANTERIOR CERVICAL DECOMPRESSION/DISCECTOMY FUSION 3 LEVELS;  Surgeon: Oneil Rodgers Priestly, MD;  Location: [facility] OR;  Service: Orthopedics;  Laterality: N/A;  C 3-6 ACDF   BREAST BIOPSY Right    CARDIAC CATHETERIZATION  10-27-2007  DR WOLM NIDA   NORMAL CORONARY ARTERIES/ NORMAL LVF   CARDIOVASCULAR STRESS TEST  02-25-2010  DR CRENSHAW   ANTERIOR ATTENUATION NO SCAR OR ISCHEMIA/ EF 81%   CATARACT EXTRACTION W/ INTRAOCULAR LENS  IMPLANT, BILATERAL     CESAREAN SECTION  1975   COLONOSCOPY     CYSTO WITH HYDRODISTENSION N/A 06/14/2013   Procedure: CYSTOSCOPY/HYDRODISTENSION/INSTILLATION OF MARCAINE  AND PYRIDIUM ;  Surgeon: Glendia DELENA Elizabeth, MD;  Location: [facility];  Service: Urology;  Laterality: N/A;   ELBOW TENDON RELEASE Left 1996   GLAUCOMA SURGERY Bilateral 2004   RHINOPLASTY  2010   SPINAL FUSION N/A  2017   L 4-5, S-1   SPINAL FUSION  11/25/2022   L2,3, L3,4, and revision of previous fusions   THUMB TENDON TRANSPOSITION Right 2012   TONSILLECTOMY  1951   TOTAL ABDOMINAL HYSTERECTOMY W/ BILATERAL SALPINGOOPHORECTOMY  1982   TRANSTHORACIC ECHOCARDIOGRAM  03/04/2010   MODERATE LVH/ NORMAL LVSF/ MILD DIASTOLIC DYSFUNCTION/ EF 60%/ MILD AI   UPPER GASTROINTESTINAL ENDOSCOPY      Social History ASHANTEE DEUPREE  reports that she has never smoked. She has never used smokeless tobacco. She reports current alcohol  use of about 5.0 standard drinks of alcohol  per week. She reports that she does not use drugs.  family history includes Breast cancer in her maternal aunt; Lung disease in her father; Pulmonary fibrosis in her mother.  Allergies  Allergen Reactions   Aspirin Other (See Comments)    Chest pain  Other Reaction(s): chest pain, chest pain   Avelox [Moxifloxacin] Hives   Moxifloxacin Hcl In Nacl Diarrhea and Hives   Nitrofurantoin Diarrhea    Other Reaction(s): GI Intolerance   Nsaids Other (See Comments)    Chest pain  Other Reaction(s): Other (See Comments)  Chest pain, Chest pain, Chest pain   Sulfa Antibiotics Hives and Other (See Comments)    Chest pain Chest pain Chest pain    Chlorthalidone Other (See Comments)  Unknown    Hydrochlorothiazide     unknown       PHYSICAL EXAMINATION: Vital signs: BP (!) 151/96   Pulse (!) 102   Temp (!) 97.3 F (36.3 C)   Ht 5' 3 (1.6 m)   Wt 164 lb (74.4 kg)   BMI 29.05 kg/m  General: Well-developed, well-nourished, no acute distress HEENT: Sclerae are anicteric, conjunctiva pink. Oral mucosa intact Lungs: Clear Heart: Regular Abdomen: soft, nontender, nondistended, no obvious ascites, no peritoneal signs, normal bowel sounds. No organomegaly. Extremities: No edema Psychiatric: alert and oriented x3. Cooperative     ASSESSMENT:  History of multiple adenomatous polyps   PLAN:  Surveillance  colonoscopy

## 2023-12-01 NOTE — Progress Notes (Signed)
 Called to room to assist during endoscopic procedure.  Patient ID and intended procedure confirmed with present staff. Received instructions for my participation in the procedure from the performing physician.

## 2023-12-01 NOTE — Op Note (Signed)
 [facility] Patient Name: April Berg Procedure Date: 12/01/2023 10:01 AM MRN: [id number] Endoscopist: Norleen SAILOR. Abran , MD, [id number] Age: 78 Referring MD:  Date of Birth: [birth year] Gender: Female Account #: [id number] Procedure:                Colonoscopy with cold snare polypectomy x 2; biopsy                            polypectomy x 1 Indications:              High risk colon cancer surveillance: Personal                            history of multiple (3 or more) adenomas. Previous                            examinations 2005 (negative, elsewhere); 2015, 2019 Medicines:                Monitored Anesthesia Care Procedure:                Pre-Anesthesia Assessment:                           - Prior to the procedure, a History and Physical                            was performed, and patient medications and                            allergies were reviewed. The patient's tolerance of                            previous anesthesia was also reviewed. The risks                            and benefits of the procedure and the sedation                            options and risks were discussed with the patient.                            All questions were answered, and informed consent                            was obtained. Prior Anticoagulants: The patient has                            taken no anticoagulant or antiplatelet agents. ASA                            Grade Assessment: II - A patient with mild systemic                            disease. After reviewing the risks and benefits,  the patient was deemed in satisfactory condition to                            undergo the procedure.                           After obtaining informed consent, the colonoscope                            was passed under direct vision. Throughout the                            procedure, the patient's blood pressure, pulse, and                             oxygen saturations were monitored continuously. The                            Olympus Scope [id number] was introduced through the                            anus and advanced to the the cecum, identified by                            appendiceal orifice and ileocecal valve. The                            ileocecal valve, appendiceal orifice, and rectum                            were photographed. The quality of the bowel                            preparation was excellent. The colonoscopy was                            performed without difficulty. The patient tolerated                            the procedure well. The bowel preparation used was                            SUPREP via split dose instruction. Scope In: 10:19:39 AM Scope Out: 10:36:31 AM Scope Withdrawal Time: 0 hours 12 minutes 47 seconds  Total Procedure Duration: 0 hours 16 minutes 52 seconds  Findings:                 Two polyps were found in the ascending colon. The                            polyps were 2 to 4 mm in size. These polyps were                            removed with a cold snare. Resection  and retrieval                            were complete.                           A 1 mm polyp was found in the transverse colon. The                            polyp was removed with a jumbo cold forceps.                            Resection and retrieval were complete.                           Internal hemorrhoids were found during                            retroflexion. The hemorrhoids were small.                           The exam was otherwise without abnormality on                            direct and retroflexion views. Complications:            No immediate complications. Estimated blood loss:                            None. Estimated Blood Loss:     Estimated blood loss: none. Impression:               - Two 2 to 4 mm polyps in the ascending colon,                            removed with a cold snare.  Resected and retrieved.                           - One 1 mm polyp in the transverse colon, removed                            with a jumbo cold forceps. Resected and retrieved.                           - Internal hemorrhoids.                           - The examination was otherwise normal on direct                            and retroflexion views. Recommendation:           - Repeat colonoscopy is not recommended for                            surveillance.                           -  Patient has a contact number available for                            emergencies. The signs and symptoms of potential                            delayed complications were discussed with the                            patient. Return to normal activities tomorrow.                            Written discharge instructions were provided to the                            patient.                           - Resume previous diet.                           - Continue present medications.                           - Await pathology results. Norleen SAILOR. Abran, MD 12/01/2023 10:47:13 AM This report has been signed electronically.

## 2023-12-02 ENCOUNTER — Telehealth: Payer: Self-pay

## 2023-12-02 NOTE — Telephone Encounter (Signed)
  Follow up Call-     12/01/2023    8:35 AM  Call back number  Post procedure Call Back phone  # [phone]  Permission to leave phone message Yes     Patient questions:  Do you have a fever, pain , or abdominal swelling? No. Pain Score  0 *  Have you tolerated food without any problems? Yes.    Have you been able to return to your normal activities? Yes.    Do you have any questions about your discharge instructions: Diet   No. Medications  No. Follow up visit  No.  Do you have questions or concerns about your Care? No.  Actions: * If pain score is 4 or above: No action needed, pain <4.

## 2023-12-05 ENCOUNTER — Encounter: Payer: Self-pay | Admitting: Internal Medicine

## 2023-12-05 LAB — SURGICAL PATHOLOGY

## 2023-12-06 DIAGNOSIS — Z85828 Personal history of other malignant neoplasm of skin: Secondary | ICD-10-CM | POA: Diagnosis not present

## 2023-12-06 DIAGNOSIS — L814 Other melanin hyperpigmentation: Secondary | ICD-10-CM | POA: Diagnosis not present

## 2023-12-06 DIAGNOSIS — Z08 Encounter for follow-up examination after completed treatment for malignant neoplasm: Secondary | ICD-10-CM | POA: Diagnosis not present

## 2023-12-06 DIAGNOSIS — R229 Localized swelling, mass and lump, unspecified: Secondary | ICD-10-CM | POA: Diagnosis not present

## 2023-12-06 DIAGNOSIS — L570 Actinic keratosis: Secondary | ICD-10-CM | POA: Diagnosis not present

## 2023-12-06 DIAGNOSIS — C4492 Squamous cell carcinoma of skin, unspecified: Secondary | ICD-10-CM | POA: Diagnosis not present

## 2023-12-06 DIAGNOSIS — D1801 Hemangioma of skin and subcutaneous tissue: Secondary | ICD-10-CM | POA: Diagnosis not present

## 2023-12-06 DIAGNOSIS — L578 Other skin changes due to chronic exposure to nonionizing radiation: Secondary | ICD-10-CM | POA: Diagnosis not present

## 2023-12-23 ENCOUNTER — Other Ambulatory Visit (HOSPITAL_BASED_OUTPATIENT_CLINIC_OR_DEPARTMENT_OTHER): Payer: Self-pay

## 2023-12-26 ENCOUNTER — Other Ambulatory Visit (HOSPITAL_BASED_OUTPATIENT_CLINIC_OR_DEPARTMENT_OTHER): Payer: Self-pay

## 2023-12-26 MED ORDER — OXYCODONE-ACETAMINOPHEN 5-325 MG PO TABS
1.0000 | ORAL_TABLET | Freq: Two times a day (BID) | ORAL | 0 refills | Status: DC | PRN
  Filled 2023-12-26: qty 60, 30d supply, fill #0

## 2023-12-27 ENCOUNTER — Other Ambulatory Visit (HOSPITAL_BASED_OUTPATIENT_CLINIC_OR_DEPARTMENT_OTHER): Payer: Self-pay

## 2024-01-09 ENCOUNTER — Other Ambulatory Visit: Payer: Self-pay

## 2024-01-09 ENCOUNTER — Other Ambulatory Visit (HOSPITAL_BASED_OUTPATIENT_CLINIC_OR_DEPARTMENT_OTHER): Payer: Self-pay

## 2024-01-12 ENCOUNTER — Other Ambulatory Visit (HOSPITAL_BASED_OUTPATIENT_CLINIC_OR_DEPARTMENT_OTHER): Payer: Self-pay

## 2024-01-12 DIAGNOSIS — M170 Bilateral primary osteoarthritis of knee: Secondary | ICD-10-CM | POA: Diagnosis not present

## 2024-01-14 ENCOUNTER — Other Ambulatory Visit (HOSPITAL_BASED_OUTPATIENT_CLINIC_OR_DEPARTMENT_OTHER): Payer: Self-pay

## 2024-01-16 ENCOUNTER — Other Ambulatory Visit (HOSPITAL_BASED_OUTPATIENT_CLINIC_OR_DEPARTMENT_OTHER): Payer: Self-pay

## 2024-01-16 MED ORDER — GABAPENTIN 300 MG PO CAPS
300.0000 mg | ORAL_CAPSULE | Freq: Two times a day (BID) | ORAL | 2 refills | Status: DC
  Filled 2024-01-16: qty 60, 30d supply, fill #0
  Filled 2024-02-15: qty 60, 30d supply, fill #1
  Filled 2024-03-05: qty 14, 7d supply, fill #2
  Filled 2024-03-15: qty 14, 7d supply, fill #3
  Filled 2024-03-28: qty 14, 7d supply, fill #4

## 2024-01-19 ENCOUNTER — Other Ambulatory Visit (HOSPITAL_BASED_OUTPATIENT_CLINIC_OR_DEPARTMENT_OTHER): Payer: Self-pay

## 2024-01-19 DIAGNOSIS — M170 Bilateral primary osteoarthritis of knee: Secondary | ICD-10-CM | POA: Diagnosis not present

## 2024-01-24 DIAGNOSIS — L570 Actinic keratosis: Secondary | ICD-10-CM | POA: Diagnosis not present

## 2024-01-24 DIAGNOSIS — L820 Inflamed seborrheic keratosis: Secondary | ICD-10-CM | POA: Diagnosis not present

## 2024-01-26 DIAGNOSIS — M170 Bilateral primary osteoarthritis of knee: Secondary | ICD-10-CM | POA: Diagnosis not present

## 2024-02-07 ENCOUNTER — Other Ambulatory Visit: Payer: Self-pay

## 2024-02-07 ENCOUNTER — Other Ambulatory Visit (HOSPITAL_BASED_OUTPATIENT_CLINIC_OR_DEPARTMENT_OTHER): Payer: Self-pay

## 2024-02-07 DIAGNOSIS — M961 Postlaminectomy syndrome, not elsewhere classified: Secondary | ICD-10-CM | POA: Diagnosis not present

## 2024-02-07 DIAGNOSIS — M5416 Radiculopathy, lumbar region: Secondary | ICD-10-CM | POA: Diagnosis not present

## 2024-02-07 DIAGNOSIS — G894 Chronic pain syndrome: Secondary | ICD-10-CM | POA: Diagnosis not present

## 2024-02-07 DIAGNOSIS — M47818 Spondylosis without myelopathy or radiculopathy, sacral and sacrococcygeal region: Secondary | ICD-10-CM | POA: Diagnosis not present

## 2024-02-07 MED ORDER — OXYCODONE-ACETAMINOPHEN 5-325 MG PO TABS
1.0000 | ORAL_TABLET | Freq: Two times a day (BID) | ORAL | 0 refills | Status: DC | PRN
  Filled 2024-03-07: qty 60, 30d supply, fill #0

## 2024-02-07 MED ORDER — GABAPENTIN 300 MG PO CAPS
300.0000 mg | ORAL_CAPSULE | Freq: Two times a day (BID) | ORAL | 2 refills | Status: DC
Start: 2024-02-07 — End: 2024-09-26
  Filled 2024-08-02 – 2024-08-03 (×2): qty 60, 30d supply, fill #0
  Filled 2024-09-06: qty 60, 30d supply, fill #1

## 2024-02-07 MED ORDER — OXYCODONE-ACETAMINOPHEN 5-325 MG PO TABS
1.0000 | ORAL_TABLET | Freq: Two times a day (BID) | ORAL | 0 refills | Status: DC | PRN
Start: 2024-02-07 — End: 2024-09-26
  Filled 2024-02-07: qty 60, 30d supply, fill #0

## 2024-02-08 ENCOUNTER — Other Ambulatory Visit (HOSPITAL_BASED_OUTPATIENT_CLINIC_OR_DEPARTMENT_OTHER): Payer: Self-pay

## 2024-02-15 ENCOUNTER — Other Ambulatory Visit (HOSPITAL_BASED_OUTPATIENT_CLINIC_OR_DEPARTMENT_OTHER): Payer: Self-pay

## 2024-02-27 DIAGNOSIS — N3010 Interstitial cystitis (chronic) without hematuria: Secondary | ICD-10-CM | POA: Diagnosis not present

## 2024-02-27 DIAGNOSIS — N952 Postmenopausal atrophic vaginitis: Secondary | ICD-10-CM | POA: Diagnosis not present

## 2024-03-05 ENCOUNTER — Other Ambulatory Visit (HOSPITAL_BASED_OUTPATIENT_CLINIC_OR_DEPARTMENT_OTHER): Payer: Self-pay

## 2024-03-05 DIAGNOSIS — M170 Bilateral primary osteoarthritis of knee: Secondary | ICD-10-CM | POA: Diagnosis not present

## 2024-03-06 DIAGNOSIS — R051 Acute cough: Secondary | ICD-10-CM | POA: Diagnosis not present

## 2024-03-06 DIAGNOSIS — J069 Acute upper respiratory infection, unspecified: Secondary | ICD-10-CM | POA: Diagnosis not present

## 2024-03-06 DIAGNOSIS — J309 Allergic rhinitis, unspecified: Secondary | ICD-10-CM | POA: Diagnosis not present

## 2024-03-06 DIAGNOSIS — R0981 Nasal congestion: Secondary | ICD-10-CM | POA: Diagnosis not present

## 2024-03-06 DIAGNOSIS — I10 Essential (primary) hypertension: Secondary | ICD-10-CM | POA: Diagnosis not present

## 2024-03-06 DIAGNOSIS — J45909 Unspecified asthma, uncomplicated: Secondary | ICD-10-CM | POA: Diagnosis not present

## 2024-03-07 ENCOUNTER — Other Ambulatory Visit (HOSPITAL_BASED_OUTPATIENT_CLINIC_OR_DEPARTMENT_OTHER): Payer: Self-pay

## 2024-03-08 DIAGNOSIS — M71322 Other bursal cyst, left elbow: Secondary | ICD-10-CM | POA: Diagnosis not present

## 2024-03-08 DIAGNOSIS — M25522 Pain in left elbow: Secondary | ICD-10-CM | POA: Diagnosis not present

## 2024-03-30 DIAGNOSIS — L4059 Other psoriatic arthropathy: Secondary | ICD-10-CM | POA: Diagnosis not present

## 2024-03-30 DIAGNOSIS — E663 Overweight: Secondary | ICD-10-CM | POA: Diagnosis not present

## 2024-03-30 DIAGNOSIS — Z6829 Body mass index (BMI) 29.0-29.9, adult: Secondary | ICD-10-CM | POA: Diagnosis not present

## 2024-03-30 DIAGNOSIS — M5030 Other cervical disc degeneration, unspecified cervical region: Secondary | ICD-10-CM | POA: Diagnosis not present

## 2024-03-30 DIAGNOSIS — M5136 Other intervertebral disc degeneration, lumbar region with discogenic back pain only: Secondary | ICD-10-CM | POA: Diagnosis not present

## 2024-03-30 DIAGNOSIS — Z79899 Other long term (current) drug therapy: Secondary | ICD-10-CM | POA: Diagnosis not present

## 2024-03-30 DIAGNOSIS — M1991 Primary osteoarthritis, unspecified site: Secondary | ICD-10-CM | POA: Diagnosis not present

## 2024-03-30 DIAGNOSIS — L409 Psoriasis, unspecified: Secondary | ICD-10-CM | POA: Diagnosis not present

## 2024-04-02 ENCOUNTER — Other Ambulatory Visit (HOSPITAL_BASED_OUTPATIENT_CLINIC_OR_DEPARTMENT_OTHER): Payer: Self-pay

## 2024-04-02 DIAGNOSIS — M5416 Radiculopathy, lumbar region: Secondary | ICD-10-CM | POA: Diagnosis not present

## 2024-04-02 DIAGNOSIS — M961 Postlaminectomy syndrome, not elsewhere classified: Secondary | ICD-10-CM | POA: Diagnosis not present

## 2024-04-02 DIAGNOSIS — G894 Chronic pain syndrome: Secondary | ICD-10-CM | POA: Diagnosis not present

## 2024-04-02 DIAGNOSIS — M47818 Spondylosis without myelopathy or radiculopathy, sacral and sacrococcygeal region: Secondary | ICD-10-CM | POA: Diagnosis not present

## 2024-04-02 MED ORDER — GABAPENTIN 300 MG PO CAPS
300.0000 mg | ORAL_CAPSULE | Freq: Two times a day (BID) | ORAL | 2 refills | Status: AC
  Filled 2024-04-02: qty 60, 30d supply, fill #0
  Filled 2024-05-03: qty 60, 30d supply, fill #1
  Filled 2024-06-04: qty 60, 30d supply, fill #2

## 2024-04-02 MED ORDER — DULOXETINE HCL 30 MG PO CPEP
30.0000 mg | ORAL_CAPSULE | Freq: Every day | ORAL | 2 refills | Status: DC
Start: 2024-04-02 — End: 2024-07-12
  Filled 2024-04-02: qty 30, 30d supply, fill #0
  Filled 2024-05-01: qty 30, 30d supply, fill #1
  Filled 2024-06-04: qty 30, 30d supply, fill #2

## 2024-04-02 MED ORDER — OXYCODONE-ACETAMINOPHEN 5-325 MG PO TABS
1.0000 | ORAL_TABLET | Freq: Two times a day (BID) | ORAL | 0 refills | Status: DC | PRN
  Filled 2024-04-06: qty 60, 30d supply, fill #0

## 2024-04-06 ENCOUNTER — Other Ambulatory Visit (HOSPITAL_BASED_OUTPATIENT_CLINIC_OR_DEPARTMENT_OTHER): Payer: Self-pay

## 2024-04-23 ENCOUNTER — Other Ambulatory Visit (HOSPITAL_BASED_OUTPATIENT_CLINIC_OR_DEPARTMENT_OTHER): Payer: Self-pay | Admitting: Internal Medicine

## 2024-04-23 DIAGNOSIS — Z1231 Encounter for screening mammogram for malignant neoplasm of breast: Secondary | ICD-10-CM

## 2024-04-27 DIAGNOSIS — E039 Hypothyroidism, unspecified: Secondary | ICD-10-CM | POA: Diagnosis not present

## 2024-04-27 DIAGNOSIS — M810 Age-related osteoporosis without current pathological fracture: Secondary | ICD-10-CM | POA: Diagnosis not present

## 2024-04-27 DIAGNOSIS — I10 Essential (primary) hypertension: Secondary | ICD-10-CM | POA: Diagnosis not present

## 2024-04-27 DIAGNOSIS — E785 Hyperlipidemia, unspecified: Secondary | ICD-10-CM | POA: Diagnosis not present

## 2024-04-27 DIAGNOSIS — K219 Gastro-esophageal reflux disease without esophagitis: Secondary | ICD-10-CM | POA: Diagnosis not present

## 2024-05-01 DIAGNOSIS — H52203 Unspecified astigmatism, bilateral: Secondary | ICD-10-CM | POA: Diagnosis not present

## 2024-05-01 DIAGNOSIS — H401111 Primary open-angle glaucoma, right eye, mild stage: Secondary | ICD-10-CM | POA: Diagnosis not present

## 2024-05-01 DIAGNOSIS — H401122 Primary open-angle glaucoma, left eye, moderate stage: Secondary | ICD-10-CM | POA: Diagnosis not present

## 2024-05-02 DIAGNOSIS — M797 Fibromyalgia: Secondary | ICD-10-CM | POA: Diagnosis not present

## 2024-05-02 DIAGNOSIS — M542 Cervicalgia: Secondary | ICD-10-CM | POA: Diagnosis not present

## 2024-05-02 DIAGNOSIS — M1711 Unilateral primary osteoarthritis, right knee: Secondary | ICD-10-CM | POA: Diagnosis not present

## 2024-05-02 DIAGNOSIS — M810 Age-related osteoporosis without current pathological fracture: Secondary | ICD-10-CM | POA: Diagnosis not present

## 2024-05-02 DIAGNOSIS — Z1331 Encounter for screening for depression: Secondary | ICD-10-CM | POA: Diagnosis not present

## 2024-05-02 DIAGNOSIS — I10 Essential (primary) hypertension: Secondary | ICD-10-CM | POA: Diagnosis not present

## 2024-05-02 DIAGNOSIS — Z1339 Encounter for screening examination for other mental health and behavioral disorders: Secondary | ICD-10-CM | POA: Diagnosis not present

## 2024-05-02 DIAGNOSIS — E039 Hypothyroidism, unspecified: Secondary | ICD-10-CM | POA: Diagnosis not present

## 2024-05-02 DIAGNOSIS — F3342 Major depressive disorder, recurrent, in full remission: Secondary | ICD-10-CM | POA: Diagnosis not present

## 2024-05-02 DIAGNOSIS — J45909 Unspecified asthma, uncomplicated: Secondary | ICD-10-CM | POA: Diagnosis not present

## 2024-05-02 DIAGNOSIS — R82998 Other abnormal findings in urine: Secondary | ICD-10-CM | POA: Diagnosis not present

## 2024-05-02 DIAGNOSIS — Z0000 Encounter for general adult medical examination without abnormal findings: Secondary | ICD-10-CM | POA: Diagnosis not present

## 2024-05-02 DIAGNOSIS — I700 Atherosclerosis of aorta: Secondary | ICD-10-CM | POA: Diagnosis not present

## 2024-05-02 DIAGNOSIS — L4050 Arthropathic psoriasis, unspecified: Secondary | ICD-10-CM | POA: Diagnosis not present

## 2024-05-02 DIAGNOSIS — G720 Drug-induced myopathy: Secondary | ICD-10-CM | POA: Diagnosis not present

## 2024-05-02 DIAGNOSIS — E785 Hyperlipidemia, unspecified: Secondary | ICD-10-CM | POA: Diagnosis not present

## 2024-05-10 DIAGNOSIS — J069 Acute upper respiratory infection, unspecified: Secondary | ICD-10-CM | POA: Diagnosis not present

## 2024-05-10 DIAGNOSIS — R051 Acute cough: Secondary | ICD-10-CM | POA: Diagnosis not present

## 2024-05-10 DIAGNOSIS — J45909 Unspecified asthma, uncomplicated: Secondary | ICD-10-CM | POA: Diagnosis not present

## 2024-05-10 DIAGNOSIS — I10 Essential (primary) hypertension: Secondary | ICD-10-CM | POA: Diagnosis not present

## 2024-05-16 ENCOUNTER — Other Ambulatory Visit (HOSPITAL_BASED_OUTPATIENT_CLINIC_OR_DEPARTMENT_OTHER): Payer: Self-pay

## 2024-05-16 MED ORDER — OXYCODONE-ACETAMINOPHEN 5-325 MG PO TABS
1.0000 | ORAL_TABLET | Freq: Two times a day (BID) | ORAL | 0 refills | Status: DC | PRN
  Filled 2024-05-16: qty 60, 30d supply, fill #0

## 2024-05-29 ENCOUNTER — Other Ambulatory Visit (HOSPITAL_BASED_OUTPATIENT_CLINIC_OR_DEPARTMENT_OTHER): Payer: Self-pay

## 2024-05-29 DIAGNOSIS — G894 Chronic pain syndrome: Secondary | ICD-10-CM | POA: Diagnosis not present

## 2024-05-29 DIAGNOSIS — M961 Postlaminectomy syndrome, not elsewhere classified: Secondary | ICD-10-CM | POA: Diagnosis not present

## 2024-05-29 DIAGNOSIS — M5416 Radiculopathy, lumbar region: Secondary | ICD-10-CM | POA: Diagnosis not present

## 2024-05-29 DIAGNOSIS — M47818 Spondylosis without myelopathy or radiculopathy, sacral and sacrococcygeal region: Secondary | ICD-10-CM | POA: Diagnosis not present

## 2024-05-29 MED ORDER — OXYCODONE-ACETAMINOPHEN 5-325 MG PO TABS
1.0000 | ORAL_TABLET | Freq: Two times a day (BID) | ORAL | 0 refills | Status: DC | PRN
  Filled 2024-07-16: qty 60, 30d supply, fill #0

## 2024-05-29 MED ORDER — OXYCODONE-ACETAMINOPHEN 5-325 MG PO TABS
1.0000 | ORAL_TABLET | Freq: Two times a day (BID) | ORAL | 0 refills | Status: DC | PRN
  Filled 2024-06-16: qty 60, 30d supply, fill #0

## 2024-06-04 ENCOUNTER — Other Ambulatory Visit (HOSPITAL_BASED_OUTPATIENT_CLINIC_OR_DEPARTMENT_OTHER): Payer: Self-pay

## 2024-06-05 ENCOUNTER — Encounter (HOSPITAL_BASED_OUTPATIENT_CLINIC_OR_DEPARTMENT_OTHER): Payer: Self-pay | Admitting: Radiology

## 2024-06-05 ENCOUNTER — Ambulatory Visit (HOSPITAL_BASED_OUTPATIENT_CLINIC_OR_DEPARTMENT_OTHER)
Admission: RE | Admit: 2024-06-05 | Discharge: 2024-06-05 | Disposition: A | Discharge status: Home / Self Care | Source: Ambulatory Visit | Attending: Internal Medicine | Admitting: Internal Medicine

## 2024-06-05 DIAGNOSIS — Z1231 Encounter for screening mammogram for malignant neoplasm of breast: Secondary | ICD-10-CM | POA: Diagnosis not present

## 2024-06-13 ENCOUNTER — Other Ambulatory Visit (HOSPITAL_BASED_OUTPATIENT_CLINIC_OR_DEPARTMENT_OTHER): Payer: Self-pay

## 2024-06-16 ENCOUNTER — Other Ambulatory Visit (HOSPITAL_BASED_OUTPATIENT_CLINIC_OR_DEPARTMENT_OTHER): Payer: Self-pay

## 2024-06-18 DIAGNOSIS — H401122 Primary open-angle glaucoma, left eye, moderate stage: Secondary | ICD-10-CM | POA: Diagnosis not present

## 2024-06-18 DIAGNOSIS — H401111 Primary open-angle glaucoma, right eye, mild stage: Secondary | ICD-10-CM | POA: Diagnosis not present

## 2024-06-21 ENCOUNTER — Ambulatory Visit (HOSPITAL_COMMUNITY): Admit: 2024-06-21 | Admitting: Orthopedic Surgery

## 2024-06-21 SURGERY — ARTHROPLASTY, KNEE, TOTAL
Anesthesia: Spinal | Site: Knee | Laterality: Right

## 2024-07-06 DIAGNOSIS — L4059 Other psoriatic arthropathy: Secondary | ICD-10-CM | POA: Diagnosis not present

## 2024-07-10 ENCOUNTER — Other Ambulatory Visit (HOSPITAL_BASED_OUTPATIENT_CLINIC_OR_DEPARTMENT_OTHER): Payer: Self-pay

## 2024-07-11 ENCOUNTER — Other Ambulatory Visit (HOSPITAL_BASED_OUTPATIENT_CLINIC_OR_DEPARTMENT_OTHER): Payer: Self-pay

## 2024-07-12 ENCOUNTER — Other Ambulatory Visit: Payer: Self-pay

## 2024-07-12 ENCOUNTER — Other Ambulatory Visit (HOSPITAL_BASED_OUTPATIENT_CLINIC_OR_DEPARTMENT_OTHER): Payer: Self-pay

## 2024-07-12 MED ORDER — DULOXETINE HCL 30 MG PO CPEP
30.0000 mg | ORAL_CAPSULE | Freq: Every day | ORAL | 2 refills | Status: DC
  Filled 2024-07-12: qty 30, 30d supply, fill #0
  Filled 2024-08-11: qty 30, 30d supply, fill #1

## 2024-07-16 ENCOUNTER — Other Ambulatory Visit (HOSPITAL_BASED_OUTPATIENT_CLINIC_OR_DEPARTMENT_OTHER): Payer: Self-pay

## 2024-08-02 ENCOUNTER — Other Ambulatory Visit (HOSPITAL_BASED_OUTPATIENT_CLINIC_OR_DEPARTMENT_OTHER): Payer: Self-pay

## 2024-08-03 ENCOUNTER — Other Ambulatory Visit (HOSPITAL_BASED_OUTPATIENT_CLINIC_OR_DEPARTMENT_OTHER): Payer: Self-pay

## 2024-08-11 ENCOUNTER — Other Ambulatory Visit (HOSPITAL_BASED_OUTPATIENT_CLINIC_OR_DEPARTMENT_OTHER): Payer: Self-pay

## 2024-08-13 ENCOUNTER — Other Ambulatory Visit (HOSPITAL_BASED_OUTPATIENT_CLINIC_OR_DEPARTMENT_OTHER): Payer: Self-pay

## 2024-08-13 MED ORDER — OXYCODONE-ACETAMINOPHEN 5-325 MG PO TABS
1.0000 | ORAL_TABLET | Freq: Two times a day (BID) | ORAL | 0 refills | Status: DC | PRN
  Filled 2024-08-13: qty 60, 30d supply, fill #0

## 2024-08-21 DIAGNOSIS — M797 Fibromyalgia: Secondary | ICD-10-CM | POA: Diagnosis not present

## 2024-08-21 DIAGNOSIS — L409 Psoriasis, unspecified: Secondary | ICD-10-CM | POA: Diagnosis not present

## 2024-08-21 DIAGNOSIS — M5136 Other intervertebral disc degeneration, lumbar region with discogenic back pain only: Secondary | ICD-10-CM | POA: Diagnosis not present

## 2024-08-21 DIAGNOSIS — Z79899 Other long term (current) drug therapy: Secondary | ICD-10-CM | POA: Diagnosis not present

## 2024-08-21 DIAGNOSIS — M5030 Other cervical disc degeneration, unspecified cervical region: Secondary | ICD-10-CM | POA: Diagnosis not present

## 2024-08-21 DIAGNOSIS — M1991 Primary osteoarthritis, unspecified site: Secondary | ICD-10-CM | POA: Diagnosis not present

## 2024-08-21 DIAGNOSIS — E663 Overweight: Secondary | ICD-10-CM | POA: Diagnosis not present

## 2024-08-21 DIAGNOSIS — Z6829 Body mass index (BMI) 29.0-29.9, adult: Secondary | ICD-10-CM | POA: Diagnosis not present

## 2024-08-21 DIAGNOSIS — L4059 Other psoriatic arthropathy: Secondary | ICD-10-CM | POA: Diagnosis not present

## 2024-08-22 ENCOUNTER — Other Ambulatory Visit (HOSPITAL_BASED_OUTPATIENT_CLINIC_OR_DEPARTMENT_OTHER): Payer: Self-pay

## 2024-08-25 DIAGNOSIS — Z23 Encounter for immunization: Secondary | ICD-10-CM | POA: Diagnosis not present

## 2024-09-04 ENCOUNTER — Other Ambulatory Visit (HOSPITAL_BASED_OUTPATIENT_CLINIC_OR_DEPARTMENT_OTHER): Payer: Self-pay

## 2024-09-04 DIAGNOSIS — M5416 Radiculopathy, lumbar region: Secondary | ICD-10-CM | POA: Diagnosis not present

## 2024-09-04 DIAGNOSIS — M961 Postlaminectomy syndrome, not elsewhere classified: Secondary | ICD-10-CM | POA: Diagnosis not present

## 2024-09-04 DIAGNOSIS — G894 Chronic pain syndrome: Secondary | ICD-10-CM | POA: Diagnosis not present

## 2024-09-04 DIAGNOSIS — M47818 Spondylosis without myelopathy or radiculopathy, sacral and sacrococcygeal region: Secondary | ICD-10-CM | POA: Diagnosis not present

## 2024-09-04 DIAGNOSIS — Z79891 Long term (current) use of opiate analgesic: Secondary | ICD-10-CM | POA: Diagnosis not present

## 2024-09-04 MED ORDER — OXYCODONE-ACETAMINOPHEN 5-325 MG PO TABS
1.0000 | ORAL_TABLET | ORAL | 0 refills | Status: DC | PRN
  Filled 2024-09-04: qty 180, 30d supply, fill #0

## 2024-09-12 NOTE — Patient Instructions (Signed)
 SURGICAL WAITING ROOM VISITATION Patients having surgery or a procedure may have no more than 2 support people in the waiting area - these visitors may rotate in the visitor waiting room.   If the patient needs to stay at the hospital during part of their recovery, the visitor guidelines for inpatient rooms apply.  PRE-OP VISITATION  Pre-op nurse will coordinate an appropriate time for 1 support person to accompany the patient in pre-op.  This support person may not rotate.  This visitor will be contacted when the time is appropriate for the visitor to come back in the pre-op area.  Please refer to the [facility] website for the visitor guidelines for Inpatients (after your surgery is over and you are in a regular room).  You are not required to quarantine at this time prior to your surgery. However, you must do this: Hand Hygiene often Do NOT share personal items Notify your provider if you are in close contact with someone who has COVID or you develop fever 100.4 or greater, new onset of sneezing, cough, sore throat, shortness of breath or body aches.  If you test positive for Covid or have been in contact with anyone that has tested positive in the last 10 days please notify you surgeon.    Your procedure is scheduled on:  TUESDAY  September 25, 2024  Report to [facility] Main Entrance: [address] entrance where the [employer] is available.   Report to admitting at:  10:15   AM  Call this number if you have any questions or problems the morning of surgery [phone]  Do not eat food after Midnight the night prior to your surgery/procedure.  After Midnight you may have the following liquids until   09:45 AM DAY OF SURGERY  Clear Liquid Diet Water  Black Coffee (sugar ok, NO MILK/CREAM OR CREAMERS)  Tea (sugar ok, NO MILK/CREAM OR CREAMERS) regular and decaf                             Plain Jell-O  with no fruit (NO RED)                                           Fruit  ices (not with fruit pulp, NO RED)                                     Popsicles (NO RED)                                                                  Juice: NO CITRUS JUICES: only apple, WHITE grape, WHITE cranberry Sports drinks like Gatorade or Powerade (NO RED)                   The day of surgery:  Drink ONE (1) Pre-Surgery Clear Ensure at 09:45  AM the morning of surgery. Drink in one sitting. Do not sip.  This drink was given to you during your hospital pre-op appointment visit. Nothing else to drink after  completing the Pre-Surgery Clear Ensure, No candy, chewing gum or throat lozenges.    FOLLOW ANY ADDITIONAL PRE OP INSTRUCTIONS YOU RECEIVED FROM YOUR SURGEON'S OFFICE!!!   Oral Hygiene is also important to reduce your risk of infection.        Remember - BRUSH YOUR TEETH THE MORNING OF SURGERY WITH YOUR REGULAR TOOTHPASTE  Do NOT smoke after Midnight the night before surgery.  STOP TAKING all Vitamins, Herbs and supplements 1 week before your surgery.   Take ONLY these medicines the morning of surgery with A SIP OF WATER : levothyroxine , dexlansoprazole, Keflex , fexofenadine, Duloxetine , gabapentin  and Oxycodone  APAP if needed for pain.  You may use your Eye drops and Flonase  nasal spray.    DO NOT TAKE METHOTREXATE the morning of your surgery.   You may not have any metal on your body including hair pins, jewelry, and body piercing  Do not wear make-up, lotions, powders, perfumes  or deodorant  Do not wear nail polish including gel and S&S, artificial / acrylic nails, or any other type of covering on natural nails including finger and toenails. If you have artificial nails, gel coating, etc., that needs to be removed by a nail salon, Please have this removed prior to surgery. Not doing so may mean that your surgery could be cancelled or delayed if the Surgeon or anesthesia staff feels like they are unable to monitor you safely.   Do not shave 48 hours prior to surgery  to avoid nicks in your skin which may contribute to postoperative infections.   Contacts, Hearing Aids, dentures or bridgework may not be worn into surgery. DENTURES WILL BE REMOVED PRIOR TO SURGERY PLEASE DO NOT APPLY Poly grip OR ADHESIVES!!!  You may bring a small overnight bag with you on the day of surgery, only pack items that are not valuable. [facility] IS NOT RESPONSIBLE   FOR VALUABLES THAT ARE LOST OR STOLEN.   Do not bring your home medications to the hospital. The Pharmacy will dispense medications listed on your medication list to you during your admission in the Hospital.  Special Instructions: Bring a copy of your healthcare power of attorney and living will documents the day of surgery, if you wish to have them scanned into your [facility] Records- EPIC  Please read over the following fact sheets you were given: IF YOU HAVE QUESTIONS ABOUT YOUR PRE-OP INSTRUCTIONS, PLEASE CALL [phone].      Pre-operative 4 CHG Bath Instructions   You can play a key role in reducing the risk of infection after surgery. Your skin needs to be as free of germs as possible. You can reduce the number of germs on your skin by washing with CHG (chlorhexidine gluconate) soap before surgery. CHG is an antiseptic soap that kills germs and continues to kill germs even after washing.   DO NOT use if you have an allergy to chlorhexidine/CHG or antibacterial soaps. If your skin becomes reddened or irritated, stop using the CHG and notify one of our RNs at   Please shower with the CHG soap starting 4 days before surgery using the following schedule: FRIDAY  09-21-24    Please keep in mind the following:  DO NOT shave, including legs and underarms, starting the day of your first shower.   You may shave your face at any point before/day of surgery.  Place clean sheets on your bed the day you start using CHG soap. Use a clean washcloth (not used since being washed) for  each shower. DO NOT  sleep with pets once you start using the CHG.  CHG Shower Instructions:  If you choose to wash your hair and private area, wash first with your normal shampoo/soap.  After you use shampoo/soap, rinse your hair and body thoroughly to remove shampoo/soap residue.  Turn the water  OFF and apply about 3 tablespoons (45 ml) of CHG soap to a CLEAN washcloth.  Apply CHG soap ONLY FROM YOUR NECK DOWN TO YOUR TOES (washing for 3-5 minutes)  DO NOT use CHG soap on face, private areas, open wounds, or sores.  Pay special attention to the area where your surgery is being performed.  If you are having back surgery, having someone wash your back for you may be helpful. Wait 2 minutes after CHG soap is applied, then you may rinse off the CHG soap.  Pat dry with a clean towel  Put on clean clothes/pajamas   If you choose to wear lotion, please use ONLY the CHG-compatible lotions on the back of this paper.     Additional instructions for the day of surgery: DO NOT APPLY any CHG Soap,  lotions, deodorants, cologne, or perfumes on the day of surgery  Put on clean/comfortable clothes.  Brush your teeth.  Ask your nurse before applying any prescription medications to the skin.   CHG Compatible Lotions   Aveeno Moisturizing lotion  Cetaphil Moisturizing Cream  Cetaphil Moisturizing Lotion  Clairol Herbal Essence Moisturizing Lotion, Dry Skin  Clairol Herbal Essence Moisturizing Lotion, Extra Dry Skin  Clairol Herbal Essence Moisturizing Lotion, Normal Skin  Curel Age Defying Therapeutic Moisturizing Lotion with Alpha Hydroxy  Curel Extreme Care Body Lotion  Curel Soothing Hands Moisturizing Hand Lotion  Curel Therapeutic Moisturizing Cream, Fragrance-Free  Curel Therapeutic Moisturizing Lotion, Fragrance-Free  Curel Therapeutic Moisturizing Lotion, Original Formula  Eucerin Daily Replenishing Lotion  Eucerin Dry Skin Therapy Plus Alpha Hydroxy Crme  Eucerin Dry Skin Therapy Plus Alpha Hydroxy Lotion   Eucerin Original Crme  Eucerin Original Lotion  Eucerin Plus Crme Eucerin Plus Lotion  Eucerin TriLipid Replenishing Lotion  Keri Anti-Bacterial Hand Lotion  Keri Deep Conditioning Original Lotion Dry Skin Formula Softly Scented  Keri Deep Conditioning Original Lotion, Fragrance Free Sensitive Skin Formula  Keri Lotion Fast Absorbing Fragrance Free Sensitive Skin Formula  Keri Lotion Fast Absorbing Softly Scented Dry Skin Formula  Keri Original Lotion  Keri Skin Renewal Lotion Keri Silky Smooth Lotion  Keri Silky Smooth Sensitive Skin Lotion  Nivea Body Creamy Conditioning Oil  Nivea Body Extra Enriched Lotion  Nivea Body Original Lotion  Nivea Body Sheer Moisturizing Lotion Nivea Crme  Nivea Skin Firming Lotion  NutraDerm 30 Skin Lotion  NutraDerm Skin Lotion  NutraDerm Therapeutic Skin Cream  NutraDerm Therapeutic Skin Lotion  ProShield Protective Hand Cream  Provon moisturizing lotion   FAILURE TO FOLLOW THESE INSTRUCTIONS MAY RESULT IN THE CANCELLATION OF YOUR SURGERY  PATIENT SIGNATURE_________________________________  NURSE SIGNATURE__________________________________  ________________________________________________________________________        April Berg    An incentive spirometer is a tool that can help keep your lungs clear and active. This tool measures how well you are filling your lungs with each breath. Taking long deep breaths may help reverse or decrease the chance of developing breathing (pulmonary) problems (especially infection) following: A long period of time when you are unable to move or be active. BEFORE THE PROCEDURE  If the spirometer includes an indicator to show your best effort, your nurse or respiratory therapist will set it to  a desired goal. If possible, sit up straight or lean slightly forward. Try not to slouch. Hold the incentive spirometer in an upright position. INSTRUCTIONS FOR USE  Sit on the edge of your bed if  possible, or sit up as far as you can in bed or on a chair. Hold the incentive spirometer in an upright position. Breathe out normally. Place the mouthpiece in your mouth and seal your lips tightly around it. Breathe in slowly and as deeply as possible, raising the piston or the ball toward the top of the column. Hold your breath for 3-5 seconds or for as long as possible. Allow the piston or ball to fall to the bottom of the column. Remove the mouthpiece from your mouth and breathe out normally. Rest for a few seconds and repeat Steps 1 through 7 at least 10 times every 1-2 hours when you are awake. Take your time and take a few normal breaths between deep breaths. The spirometer may include an indicator to show your best effort. Use the indicator as a goal to work toward during each repetition. After each set of 10 deep breaths, practice coughing to be sure your lungs are clear. If you have an incision (the cut made at the time of surgery), support your incision when coughing by placing a pillow or rolled up towels firmly against it. Once you are able to get out of bed, walk around indoors and cough well. You may stop using the incentive spirometer when instructed by your caregiver.  RISKS AND COMPLICATIONS Take your time so you do not get dizzy or light-headed. If you are in pain, you may need to take or ask for pain medication before doing incentive spirometry. It is harder to take a deep breath if you are having pain. AFTER USE Rest and breathe slowly and easily. It can be helpful to keep track of a log of your progress. Your caregiver can provide you with a simple table to help with this. If you are using the spirometer at home, follow these instructions: SEEK MEDICAL CARE IF:  You are having difficultly using the spirometer. You have trouble using the spirometer as often as instructed. Your pain medication is not giving enough relief while using the spirometer. You develop fever of  100.5 F (38.1 C) or higher.                                                                                                    SEEK IMMEDIATE MEDICAL CARE IF:  You cough up bloody sputum that had not been present before. You develop fever of 102 F (38.9 C) or greater. You develop worsening pain at or near the incision site. MAKE SURE YOU:  Understand these instructions. Will watch your condition. Will get help right away if you are not doing well or get worse. Document Released: 03/21/2007 Document Revised: 01/31/2012 Document Reviewed: 05/22/2007 [facility] Patient Information 2014 [place], [place].          If you would like to see a video about joint replacement:   [email or web address]

## 2024-09-12 NOTE — Progress Notes (Signed)
 COVID Vaccine received:  []  No [x]  Yes Date of any COVID positive Test in last 90 days: None  PCP - Elsie Loreli Raddle. MD  Cardiologist - Lonni Nanas, MD ( LOV 03-02-2021)  Chest x-ray - 05-21-2021  1 v  EKG -11-10-2022 [facility]   repeated  Stress Test -  ECHO - 01-08-2020 Cardiac Cath - 09-2007  CT Coronary Calcium  score: 0 on 02-07-2020 Zio Monitor- 02-24-2021 Epic  Pacemaker / ICD device [x]  No []  Yes   Spinal Cord Stimulator:[x]  No []  Yes       History of Sleep Apnea? [x]  No []  Yes   CPAP used?- [x]  No []  Yes    Patient has: [x]  NO Hx DM   []  Pre-DM   []  DM1  []   DM2 Does the patient monitor blood sugar?   [x]  N/A   []  No []  Yes   Blood Thinner / Instructions:  none Aspirin Instructions:  none  Dental hx: []  Dentures:  [x]  N/A      []  Bridge or Partial:                   []  Loose or Damaged teeth:   Comments: Husband is Dr. Alm Nice , retired Armed forces operational officer.   Activity level: Able to walk up 2 flights of stairs without becoming significantly short of breath or having chest pain?  []  No   [x]    Yes  Patient can perform ADLs without assistance. []  No   [x]   Yes  Anesthesia review: multiple back surgeries, ACDF 3 levels in 2014 (no problems with back surgeries 2017, 2024), Hx pericarditis 2008, HTN, glaucoma, HOH- HAs   Patient denies any S&S of respiratory illness or Covid - no shortness of breath, fever, cough or chest pain at PAT appointment.  Patient verbalized understanding and agreement to the Pre-Surgical Instructions that were given to them at this PAT appointment. Patient was also educated of the need to review these PAT instructions again prior to her surgery.I reviewed the appropriate phone numbers to call if they have any and questions or concerns.

## 2024-09-13 ENCOUNTER — Other Ambulatory Visit: Payer: Self-pay

## 2024-09-13 ENCOUNTER — Encounter (HOSPITAL_COMMUNITY)
Admission: RE | Admit: 2024-09-13 | Discharge: 2024-09-13 | Disposition: A | Discharge status: Home / Self Care | Source: Ambulatory Visit | Attending: Orthopedic Surgery | Admitting: Orthopedic Surgery

## 2024-09-13 ENCOUNTER — Encounter (HOSPITAL_COMMUNITY): Payer: Self-pay

## 2024-09-13 VITALS — BP 117/70 | HR 76 | Temp 98.2°F | Resp 16 | Ht 63.0 in | Wt 165.0 lb

## 2024-09-13 DIAGNOSIS — I10 Essential (primary) hypertension: Secondary | ICD-10-CM | POA: Insufficient documentation

## 2024-09-13 DIAGNOSIS — I2510 Atherosclerotic heart disease of native coronary artery without angina pectoris: Secondary | ICD-10-CM | POA: Diagnosis not present

## 2024-09-13 DIAGNOSIS — Z79631 Long term (current) use of antimetabolite agent: Secondary | ICD-10-CM | POA: Diagnosis not present

## 2024-09-13 DIAGNOSIS — E039 Hypothyroidism, unspecified: Secondary | ICD-10-CM | POA: Insufficient documentation

## 2024-09-13 DIAGNOSIS — Z01818 Encounter for other preprocedural examination: Secondary | ICD-10-CM | POA: Diagnosis not present

## 2024-09-13 DIAGNOSIS — M129 Arthropathy, unspecified: Secondary | ICD-10-CM

## 2024-09-13 DIAGNOSIS — M1711 Unilateral primary osteoarthritis, right knee: Secondary | ICD-10-CM | POA: Diagnosis not present

## 2024-09-13 DIAGNOSIS — Z79899 Other long term (current) drug therapy: Secondary | ICD-10-CM | POA: Insufficient documentation

## 2024-09-13 HISTORY — DX: Fibromyalgia: M79.7

## 2024-09-13 HISTORY — DX: Malignant (primary) neoplasm, unspecified: C80.1

## 2024-09-13 LAB — CBC
HCT: 39.7 % (ref 36.0–46.0)
Hemoglobin: 12.4 g/dL (ref 12.0–15.0)
MCH: 28.8 pg (ref 26.0–34.0)
MCHC: 31.2 g/dL (ref 30.0–36.0)
MCV: 92.3 fL (ref 80.0–100.0)
Platelets: 324 K/uL (ref 150–400)
RBC: 4.3 MIL/uL (ref 3.87–5.11)
RDW: 15.4 % (ref 11.5–15.5)
WBC: 8.6 K/uL (ref 4.0–10.5)
nRBC: 0 % (ref 0.0–0.2)

## 2024-09-13 LAB — COMPREHENSIVE METABOLIC PANEL WITH GFR
ALT: 12 U/L (ref 0–44)
AST: 16 U/L (ref 15–41)
Albumin: 4.3 g/dL (ref 3.5–5.0)
Alkaline Phosphatase: 109 U/L (ref 38–126)
Anion gap: 9 (ref 5–15)
BUN: 13 mg/dL (ref 8–23)
CO2: 27 mmol/L (ref 22–32)
Calcium: 8.9 mg/dL (ref 8.9–10.3)
Chloride: 103 mmol/L (ref 98–111)
Creatinine, Ser: 0.9 mg/dL (ref 0.44–1.00)
GFR, Estimated: >60 mL/min (ref >60)
Glucose, Bld: 97 mg/dL (ref 70–99)
Potassium: 4 mmol/L (ref 3.5–5.1)
Sodium: 140 mmol/L (ref 135–145)
Total Bilirubin: 0.4 mg/dL (ref 0.0–1.2)
Total Protein: 6.9 g/dL (ref 6.5–8.1)

## 2024-09-13 LAB — SURGICAL PCR SCREEN
MRSA, PCR: NEGATIVE
Staphylococcus aureus: NEGATIVE

## 2024-09-14 NOTE — Progress Notes (Signed)
 Anesthesia Chart Review   Case: [id number] Date/Time: 09/25/24 1240   Procedure: ARTHROPLASTY, KNEE, TOTAL (Right: Knee)   Anesthesia type: Spinal   Diagnosis: Primary osteoarthritis of right knee [M17.11]   Pre-op diagnosis: Right knee osteoarthritis   Location: [facility] ROOM 09 / [facility]   Surgeons: Ernie Cough, MD       DISCUSSION:78 y.o. never smoker with h/o HTN, hypothyroidism on Synthroid , right knee OA scheduled for above procedure 09/25/2024 with Dr. Cough Ernie.   S/p multiple back surgeries with lumbar fusion L2-S1. Most recent 11/25/2022. No anesthesia complications noted.   S/p ACDF, three levels.  CTA on 02/07/2020 showed no CAD (calcium  score 0).   Pt reports she can climb a flight of stairs without chest pain or shortness of breath.  VS: BP 117/70 Comment: right arm sitting  Pulse 76   Temp 36.8 C (Oral)   Resp 16   Ht 5' 3 (1.6 m)   Wt 74.8 kg   SpO2 94%   BMI 29.23 kg/m   PROVIDERS: Loreli Elsie JONETTA Mickey., MD is PCP    LABS: Labs reviewed: Acceptable for surgery. (all labs ordered are listed, but only abnormal results are displayed)  Labs Reviewed  SURGICAL PCR SCREEN  COMPREHENSIVE METABOLIC PANEL WITH GFR  CBC     IMAGES:   EKG:   CV: Echo 01/08/2020  1. Left ventricular ejection fraction, by estimation, is 55 to 60%. The  left ventricle has normal function. The left ventricle has no regional  wall motion abnormalities. Left ventricular diastolic parameters are  consistent with Grade I diastolic  dysfunction (impaired relaxation).   2. Right ventricular systolic function is normal. The right ventricular  size is normal. Tricuspid regurgitation signal is inadequate for assessing  PA pressure.   3. The mitral valve is grossly normal. Trivial mitral valve  regurgitation.   4. The aortic valve is tricuspid. Aortic valve regurgitation is trivial.  No aortic stenosis is present.   5. The inferior vena cava is normal in size with greater than 50%   respiratory variability, suggesting right atrial pressure of 3 mmHg.  Past Medical History:  Diagnosis Date   Allergy    seasonal and environmental   Anxiety    Arthritis    osteoarthritis   Cancer (HCC)    melanoma on RLE   Cataract    Complication of anesthesia 90's   block for elbow surgery and pt had a seizure... surgery since with no problem    Depression    Fibromyalgia    GERD (gastroesophageal reflux disease)    Glaucoma of both eyes    H/O hiatal hernia    History of pericarditis    DEC 2008 AND SMALL PERICARDIAL EFFUSION--   RESOLVED   Hyperlipidemia    Hypertension    Hypothyroidism    IBS (irritable bowel syndrome)    Interstitial cystitis    Migraines    Osteoporosis    Pelvic pain    Seizures (HCC)    one occurence in the 1990's after anesthesia, no problems since   Spinal stenosis    Vitamin D  deficiency     Past Surgical History:  Procedure Laterality Date   ANTERIOR CERVICAL DECOMP/DISCECTOMY FUSION  12/08/2011   Procedure: ANTERIOR CERVICAL DECOMPRESSION/DISCECTOMY FUSION 3 LEVELS;  Surgeon: Oneil Rodgers Priestly, MD;  Location: [facility];  Service: Orthopedics;  Laterality: N/A;  C 3-6 ACDF   BREAST BIOPSY Right    CARDIAC CATHETERIZATION  10-27-2007  DR WOLM NIDA  NORMAL CORONARY ARTERIES/ NORMAL LVF   CARDIOVASCULAR STRESS TEST  02-25-2010  DR CRENSHAW   ANTERIOR ATTENUATION NO SCAR OR ISCHEMIA/ EF 81%   CATARACT EXTRACTION W/ INTRAOCULAR LENS  IMPLANT, BILATERAL     CESAREAN SECTION  1975   COLONOSCOPY     CYSTO WITH HYDRODISTENSION N/A 06/14/2013   Procedure: CYSTOSCOPY/HYDRODISTENSION/INSTILLATION OF MARCAINE  AND PYRIDIUM ;  Surgeon: Glendia DELENA Elizabeth, MD;  Location: [facility];  Service: Urology;  Laterality: N/A;   ELBOW TENDON RELEASE Left 1996   GLAUCOMA SURGERY Bilateral 2004   RHINOPLASTY  2010   SPINAL FUSION N/A 2017   L 4-5, S-1   SPINAL FUSION  11/25/2022   L2,3, L3,4, and revision of previous fusions   THUMB  TENDON TRANSPOSITION Right 2012   TONSILLECTOMY  1951   TOTAL ABDOMINAL HYSTERECTOMY W/ BILATERAL SALPINGOOPHORECTOMY  1982   TRANSTHORACIC ECHOCARDIOGRAM  03/04/2010   MODERATE LVH/ NORMAL LVSF/ MILD DIASTOLIC DYSFUNCTION/ EF 60%/ MILD AI   UPPER GASTROINTESTINAL ENDOSCOPY      MEDICATIONS:  acetaminophen  (TYLENOL ) 500 MG tablet   acidophilus (RISAQUAD) CAPS capsule   albuterol (VENTOLIN HFA) 108 (90 Base) MCG/ACT inhaler   amLODipine  (NORVASC ) 5 MG tablet   Ascorbic Acid (VITAMIN C) 1000 MG tablet   brimonidine (ALPHAGAN) 0.2 % ophthalmic solution   celecoxib (CELEBREX) 100 MG capsule   cephALEXin  (KEFLEX ) 250 MG capsule   cholecalciferol  (VITAMIN D ) 1000 units tablet   dexlansoprazole (DEXILANT) 60 MG capsule   diclofenac  Sodium (VOLTAREN ) 1 % GEL   DULoxetine  (CYMBALTA ) 30 MG capsule   DULoxetine  (CYMBALTA ) 60 MG capsule   fexofenadine (ALLEGRA) 180 MG tablet   fluticasone  (FLONASE ) 50 MCG/ACT nasal spray   folic acid (FOLVITE) 1 MG tablet   gabapentin  (NEURONTIN ) 300 MG capsule   gabapentin  (NEURONTIN ) 300 MG capsule   gabapentin  (NEURONTIN ) 300 MG capsule   gabapentin  (NEURONTIN ) 300 MG capsule   gabapentin  (NEURONTIN ) 300 MG capsule   guaiFENesin (MUCINEX) 600 MG 12 hr tablet   hydrOXYzine  (ATARAX ) 25 MG tablet   levothyroxine  (SYNTHROID , LEVOTHROID) 125 MCG tablet   meclizine  (ANTIVERT ) 25 MG tablet   methotrexate (RHEUMATREX) 2.5 MG tablet   Multiple Vitamins-Minerals (PRESERVISION AREDS PO)   naloxone  (NARCAN ) nasal spray 4 mg/0.1 mL   ondansetron  (ZOFRAN -ODT) 4 MG disintegrating tablet   oxyCODONE -acetaminophen  (PERCOCET) 5-325 MG tablet   oxyCODONE -acetaminophen  (PERCOCET) 5-325 MG tablet   oxyCODONE -acetaminophen  (PERCOCET) 5-325 MG tablet   oxyCODONE -acetaminophen  (PERCOCET) 5-325 MG tablet   oxyCODONE -acetaminophen  (PERCOCET) 5-325 MG tablet   polyethylene glycol powder (GLYCOLAX /MIRALAX ) 17 GM/SCOOP powder   tiZANidine  (ZANAFLEX ) 2 MG tablet    0.9 %   sodium chloride  infusion     Debany Vantol Ward, PA-C [facility] [phone]

## 2024-09-24 NOTE — H&P (Signed)
 TOTAL KNEE ADMISSION H&P  Patient is being admitted for right total knee arthroplasty.  Therapy Plans: Rehab at [facility] Disposition: SNF -- [facility] Planned DVT Prophylaxis: Xarelto 10mg  daily DME needed: walker PCP: Dr. Loreli - clearance received Rheum - Dr. Mai - clearance received TXA: IV Allergies: aspirin - chest pain, sulfa - hives Anesthesia Concerns: hx of multiple back surgeries // wants to try spinal BMI: 29.4 Last HgbA1c: Not diabetic   Other: - Psoriatic arthritis - on methotrexate - Having flare of psoriatic arthritis and fibromyalgia - uses oxy 5 BID -- will plan on increasing post op - tizanidine , tylenol   Subjective:  Chief Complaint:right knee pain.  HPI: April Berg, 78 y.o. female, has a history of pain and functional disability in the right knee due to arthritis and has failed non-surgical conservative treatments for greater than 12 weeks to includeNSAID's and/or analgesics, corticosteriod injections, and activity modification.  Onset of symptoms was gradual, starting 2 years ago with gradually worsening course since that time. The patient noted no past surgery on the right knee(s).  Patient currently rates pain in the right knee(s) at 8 out of 10 with activity. Patient has worsening of pain with activity and weight bearing and pain that interferes with activities of daily living.  Patient has evidence of joint space narrowing by imaging studies. There is no active infection.  Patient Active Problem List   Diagnosis Date Noted   Ingrown toenail 09/11/2018   Pain of left hand 05/10/2018   Bilateral shoulder pain 03/22/2018   Fusion of spine of lumbosacral region 03/25/2016   Choroidal nevus, left 12/16/2015   Epiretinal membrane (ERM) of left eye 12/16/2015   Intermediate stage nonexudative age-related macular degeneration of both eyes 12/16/2015   Pseudophakia of both eyes 12/16/2015   Vertigo 01/30/2015   Neck pain 01/30/2015   Other cervical  disc degeneration, unspecified cervical region 01/20/2015   Laryngopharyngeal reflux (LPR) 08/15/2013   Cervical radiculopathy 10/03/2012   Balance problems 07/10/2012   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 07/10/2012   Tinnitus 07/10/2012   Allergic rhinitis 05/02/2012   Chronic sinusitis 05/02/2012   Facet arthritis of cervical region 12/08/2011   ABDOMINAL PAIN-RUQ 03/26/2010   EPIGASTRIC PAIN 03/23/2010   NONSPECIFIC ABNORMAL FIND RAD&OTH EXAM GI TRACT 07/10/2008   Hypothyroidism 07/09/2008   Major depression 07/09/2008   Unspecified glaucoma 07/09/2008   GERD 07/09/2008   IRRITABLE BOWEL SYNDROME 07/09/2008   Arthropathy 07/09/2008   CHEST PAIN 07/09/2008   Glaucoma 07/09/2008   Past Medical History:  Diagnosis Date   Allergy    seasonal and environmental   Anxiety    Arthritis    osteoarthritis   Cancer (HCC)    melanoma on RLE   Cataract    Complication of anesthesia 90's   block for elbow surgery and pt had a seizure... surgery since with no problem    Depression    Fibromyalgia    GERD (gastroesophageal reflux disease)    Glaucoma of both eyes    H/O hiatal hernia    History of pericarditis    DEC 2008 AND SMALL PERICARDIAL EFFUSION--   RESOLVED   Hyperlipidemia    Hypertension    Hypothyroidism    IBS (irritable bowel syndrome)    Interstitial cystitis    Migraines    Osteoporosis    Pelvic pain    Seizures (HCC)    one occurence in  the 1990's after anesthesia, no problems since   Spinal stenosis    Vitamin D  deficiency     Past Surgical History:  Procedure Laterality Date   ANTERIOR CERVICAL DECOMP/DISCECTOMY FUSION  12/08/2011   Procedure: ANTERIOR CERVICAL DECOMPRESSION/DISCECTOMY FUSION 3 LEVELS;  Surgeon: Oneil Rodgers Priestly, MD;  Location: [facility] OR;  Service: Orthopedics;  Laterality: N/A;  C 3-6 ACDF   BREAST BIOPSY Right    CARDIAC  CATHETERIZATION  10-27-2007  DR WOLM NIDA   NORMAL CORONARY ARTERIES/ NORMAL LVF   CARDIOVASCULAR STRESS TEST  02-25-2010  DR CRENSHAW   ANTERIOR ATTENUATION NO SCAR OR ISCHEMIA/ EF 81%   CATARACT EXTRACTION W/ INTRAOCULAR LENS  IMPLANT, BILATERAL     CESAREAN SECTION  1975   COLONOSCOPY     CYSTO WITH HYDRODISTENSION N/A 06/14/2013   Procedure: CYSTOSCOPY/HYDRODISTENSION/INSTILLATION OF MARCAINE  AND PYRIDIUM ;  Surgeon: Glendia DELENA Elizabeth, MD;  Location: [facility];  Service: Urology;  Laterality: N/A;   ELBOW TENDON RELEASE Left 1996   GLAUCOMA SURGERY Bilateral 2004   RHINOPLASTY  2010   SPINAL FUSION N/A 2017   L 4-5, S-1   SPINAL FUSION  11/25/2022   L2,3, L3,4, and revision of previous fusions   THUMB TENDON TRANSPOSITION Right 2012   TONSILLECTOMY  1951   TOTAL ABDOMINAL HYSTERECTOMY W/ BILATERAL SALPINGOOPHORECTOMY  1982   TRANSTHORACIC ECHOCARDIOGRAM  03/04/2010   MODERATE LVH/ NORMAL LVSF/ MILD DIASTOLIC DYSFUNCTION/ EF 60%/ MILD AI   UPPER GASTROINTESTINAL ENDOSCOPY      Current Facility-Administered Medications  Medication Dose Route Frequency Provider Last Rate Last Admin   0.9 %  sodium chloride  infusion  500 mL Intravenous Once [name], MD       Current Outpatient Medications  Medication Sig Dispense Refill Last Dose/Taking   acidophilus (RISAQUAD) CAPS capsule Take 1 capsule by mouth daily.   Taking   amLODipine  (NORVASC ) 5 MG tablet TAKE ONE TABLET BY MOUTH ONCE DAILY (Patient taking differently: Take 5 mg by mouth at bedtime.) 30 tablet 0 Taking Differently   Ascorbic Acid (VITAMIN C) 1000 MG tablet Take 1,000 mg by mouth daily.   Taking   brimonidine (ALPHAGAN) 0.2 % ophthalmic solution Place 1 drop into both eyes in the morning and at bedtime.   Taking   celecoxib (CELEBREX) 100 MG capsule Take 100 mg by mouth daily as needed (headache).   Taking As Needed   cephALEXin  (KEFLEX ) 250 MG capsule Take 250 mg by mouth daily.   Taking    cholecalciferol  (VITAMIN D ) 1000 units tablet Take 2,000 Units by mouth daily.   Taking   dexlansoprazole (DEXILANT) 60 MG capsule Take 60 mg by mouth daily.   Taking   DULoxetine  (CYMBALTA ) 60 MG capsule Take 1 capsule (60 mg total) by mouth daily. 30 capsule 11 Taking   fexofenadine (ALLEGRA) 180 MG tablet Take 180 mg by mouth daily.   Taking   fluticasone  (FLONASE ) 50 MCG/ACT nasal spray Place 1 spray into both nostrils daily.   Taking   folic acid (FOLVITE) 1 MG tablet Take 1 mg by mouth daily.   Taking   gabapentin  (NEURONTIN ) 300 MG capsule Take 1 capsule (300 mg total) by mouth in the morning and at bedtime. 60 capsule 2 Taking   guaiFENesin (MUCINEX) 600 MG 12 hr tablet Take 600 mg by mouth daily as needed for cough.   Taking As Needed   hydrOXYzine  (ATARAX ) 25 MG tablet Take 25 mg by mouth at bedtime.   Taking  levothyroxine  (SYNTHROID , LEVOTHROID) 125 MCG tablet Take 125 mcg by mouth daily before breakfast.   Taking   meclizine  (ANTIVERT ) 25 MG tablet Take 25 mg by mouth as needed for dizziness.   Taking As Needed   methotrexate (RHEUMATREX) 2.5 MG tablet Take 5 mg by mouth 2 (two) times a week.   Taking   Multiple Vitamins-Minerals (PRESERVISION AREDS PO) Take 1 tablet by mouth 2 (two) times daily.   Taking   naloxone  (NARCAN ) nasal spray 4 mg/0.1 mL Place 1 spray into the nose once as needed for up to 1 dose. Use if unresponsive or respirations < 8 2 each 0 Taking As Needed   ondansetron  (ZOFRAN -ODT) 4 MG disintegrating tablet Take 4 mg by mouth every 6 (six) hours as needed for vomiting or nausea.   Taking As Needed   oxyCODONE -acetaminophen  (PERCOCET) 5-325 MG tablet Take 1 tablet by mouth every 4 (four) to 6 (six) hours as needed for pain * frequency change 180 tablet 0 Taking As Needed   tiZANidine  (ZANAFLEX ) 2 MG tablet Take 1 tablet (2 mg total) by mouth 3 (three) times daily as needed. 90 tablet 2 Taking As Needed   acetaminophen  (TYLENOL ) 500 MG tablet Take 2 tablets (1,000 mg  total) by mouth 2 (two) times daily. (Patient not taking: Reported on 09/12/2024) 30 tablet 0 Not Taking   albuterol (VENTOLIN HFA) 108 (90 Base) MCG/ACT inhaler Inhale 1 puff into the lungs as needed for shortness of breath. (Patient not taking: Reported on 09/12/2024)   Not Taking   diclofenac  Sodium (VOLTAREN ) 1 % GEL Apply 4 grams to the affected area (both knees) 4 times a day. (Patient not taking: Reported on 09/12/2024) 1000 g 5 Not Taking   DULoxetine  (CYMBALTA ) 30 MG capsule Take 1 capsule (30 mg total) by mouth daily. (Patient not taking: Reported on 09/12/2024) 30 capsule 2 Not Taking   gabapentin  (NEURONTIN ) 300 MG capsule Take 1 capsule (300 mg total) by mouth 2 (two) times daily. (Patient not taking: Reported on 09/12/2024) 60 capsule 2 Not Taking   gabapentin  (NEURONTIN ) 300 MG capsule Take 1 capsule (300 mg total) by mouth 2 (two) times daily. (Patient not taking: Reported on 09/12/2024) 60 capsule 2 Not Taking   gabapentin  (NEURONTIN ) 300 MG capsule Take 1 capsule (300 mg total) by mouth 2 (two) times daily. (Patient not taking: Reported on 09/12/2024) 60 capsule 2 Not Taking   gabapentin  (NEURONTIN ) 300 MG capsule Take 1 capsule (300 mg total) by mouth 2 (two) times daily. (Patient not taking: Reported on 09/12/2024) 60 capsule 2 Not Taking   oxyCODONE -acetaminophen  (PERCOCET) 5-325 MG tablet Take 1 tablet by mouth every 12 (twelve) hours as needed for pain. (Patient not taking: Reported on 09/12/2024) 60 tablet 0 Not Taking   oxyCODONE -acetaminophen  (PERCOCET) 5-325 MG tablet Take 1 tablet by mouth every 12 (twelve) hours as needed for pain. (Patient not taking: Reported on 09/12/2024) 60 tablet 0 Not Taking   oxyCODONE -acetaminophen  (PERCOCET) 5-325 MG tablet Take 1 tablet by mouth every 12 (twelve) hours as needed for pain. (Patient not taking: Reported on 09/12/2024) 60 tablet 0 Not Taking   oxyCODONE -acetaminophen  (PERCOCET) 5-325 MG tablet Take 1 tablet by mouth every 12 (twelve)  hours as needed for pain (Patient not taking: Reported on 09/12/2024) 60 tablet 0 Not Taking   polyethylene glycol powder (GLYCOLAX /MIRALAX ) 17 GM/SCOOP powder Take 17 g by mouth daily as needed. (Patient not taking: Reported on 09/12/2024) 3350 g 1 Not Taking   Allergies  Allergen  Reactions   Aspirin Other (See Comments)    Chest pain    Avelox [Moxifloxacin] Hives   Moxifloxacin Hcl In Nacl Diarrhea and Hives   Nitrofurantoin Diarrhea    Other Reaction(s): GI Intolerance   Nsaids Other (See Comments)    Chest pain    Sulfa Antibiotics Hives and Other (See Comments)    Chest pain    Chlorthalidone Other (See Comments)    Unknown    Hydrochlorothiazide     unknown    Social History   Tobacco Use   Smoking status: Never   Smokeless tobacco: Never  Substance Use Topics   Alcohol  use: Yes    Alcohol /week: 5.0 standard drinks of alcohol     Types: 5 Glasses of wine per week    Family History  Problem Relation Age of Onset   Pulmonary fibrosis Mother    Lung disease Father    Breast cancer Maternal Aunt    Colon cancer Neg Hx    Esophageal cancer Neg Hx    Rectal cancer Neg Hx    Stomach cancer Neg Hx    Colon polyps Neg Hx      Review of Systems  Constitutional:  Negative for chills and fever.  Respiratory:  Negative for cough and shortness of breath.   Cardiovascular:  Negative for chest pain.  Gastrointestinal:  Negative for nausea and vomiting.  Musculoskeletal:  Positive for arthralgias.     Objective:  Physical Exam Bilateral knee exams: No palpable effusions, warmth erythema No significant flexion contractures Slight genu varum Tenderness over the medial and anterior aspect knees Stable medial and lateral collateral ligaments  Vital signs in last 24 hours:    Labs:   Estimated body mass index is 29.23 kg/m as calculated from the following:   Height as of 09/13/24: 5' 3 (1.6 m).   Weight as of 09/13/24: 74.8 kg.   Imaging Review Plain  radiographs demonstrate severe degenerative joint disease of the right knee(s). The overall alignment isneutral. The bone quality appears to be adequate for age and reported activity level.      Assessment/Plan:  End stage arthritis, right knee   The patient history, physical examination, clinical judgment of the provider and imaging studies are consistent with end stage degenerative joint disease of the right knee(s) and total knee arthroplasty is deemed medically necessary. The treatment options including medical management, injection therapy arthroscopy and arthroplasty were discussed at length. The risks and benefits of total knee arthroplasty were presented and reviewed. The risks due to aseptic loosening, infection, stiffness, patella tracking problems, thromboembolic complications and other imponderables were discussed. The patient acknowledged the explanation, agreed to proceed with the plan and consent was signed. Patient is being admitted for inpatient treatment for surgery, pain control, PT, OT, prophylactic antibiotics, VTE prophylaxis, progressive ambulation and ADL's and discharge planning. The patient is planning to be discharged home.     Patient's anticipated LOS is less than 2 midnights, meeting these requirements: - Younger than 87 - Lives within 1 hour of care - Has a competent adult at home to recover with post-op recover - NO history of  - Chronic pain requiring opiods  - Diabetes  - Coronary Artery Disease  - Heart failure  - Heart attack  - Stroke  - DVT/VTE  - Cardiac arrhythmia  - Respiratory Failure/COPD  - Renal failure  - Anemia  - Advanced Liver disease  Rosina Calin, PA-C Orthopedic Surgery [facility] [place] [phone]

## 2024-09-24 NOTE — Progress Notes (Signed)
 Called again to inform of earlier surgery.  Also attempted to call phone for husband.  Went straight to voice mail.

## 2024-09-25 ENCOUNTER — Observation Stay (HOSPITAL_COMMUNITY)
Admission: RE | Admit: 2024-09-25 | Discharge: 2024-09-26 | Disposition: A | Discharge status: Skilled Nursing Facility | Attending: Orthopedic Surgery | Admitting: Orthopedic Surgery

## 2024-09-25 ENCOUNTER — Ambulatory Visit (HOSPITAL_BASED_OUTPATIENT_CLINIC_OR_DEPARTMENT_OTHER): Payer: Self-pay | Admitting: Anesthesiology

## 2024-09-25 ENCOUNTER — Ambulatory Visit (HOSPITAL_COMMUNITY): Payer: Self-pay | Admitting: Physician Assistant

## 2024-09-25 ENCOUNTER — Other Ambulatory Visit: Payer: Self-pay

## 2024-09-25 ENCOUNTER — Encounter (HOSPITAL_COMMUNITY)
Admission: RE | Disposition: A | Discharge status: Skilled Nursing Facility | Payer: Self-pay | Source: Home / Self Care | Attending: Orthopedic Surgery

## 2024-09-25 ENCOUNTER — Encounter (HOSPITAL_COMMUNITY): Payer: Self-pay | Admitting: Orthopedic Surgery

## 2024-09-25 DIAGNOSIS — M1711 Unilateral primary osteoarthritis, right knee: Secondary | ICD-10-CM | POA: Diagnosis not present

## 2024-09-25 DIAGNOSIS — Z85828 Personal history of other malignant neoplasm of skin: Secondary | ICD-10-CM | POA: Insufficient documentation

## 2024-09-25 DIAGNOSIS — Z96651 Presence of right artificial knee joint: Principal | ICD-10-CM

## 2024-09-25 DIAGNOSIS — I10 Essential (primary) hypertension: Secondary | ICD-10-CM | POA: Insufficient documentation

## 2024-09-25 DIAGNOSIS — E039 Hypothyroidism, unspecified: Secondary | ICD-10-CM | POA: Diagnosis not present

## 2024-09-25 DIAGNOSIS — G8918 Other acute postprocedural pain: Secondary | ICD-10-CM | POA: Diagnosis not present

## 2024-09-25 DIAGNOSIS — Z79899 Other long term (current) drug therapy: Secondary | ICD-10-CM | POA: Diagnosis not present

## 2024-09-25 DIAGNOSIS — M25561 Pain in right knee: Secondary | ICD-10-CM | POA: Diagnosis present

## 2024-09-25 HISTORY — PX: TOTAL KNEE ARTHROPLASTY: SHX125

## 2024-09-25 SURGERY — ARTHROPLASTY, KNEE, TOTAL
Anesthesia: Spinal | Site: Knee | Laterality: Right

## 2024-09-25 MED ORDER — TRANEXAMIC ACID-NACL 1000-0.7 MG/100ML-% IV SOLN
INTRAVENOUS | Status: AC
  Filled 2024-09-25: qty 100

## 2024-09-25 MED ORDER — OXYCODONE HCL 5 MG PO TABS
5.0000 mg | ORAL_TABLET | ORAL | Status: DC | PRN
  Administered 2024-09-25 – 2024-09-26 (×2): 10 mg via ORAL
  Filled 2024-09-25 (×2): qty 2

## 2024-09-25 MED ORDER — OXYCODONE HCL 5 MG PO TABS: 5.0000 mg | ORAL_TABLET | Freq: Once | ORAL | Status: DC | PRN

## 2024-09-25 MED ORDER — ONDANSETRON HCL 4 MG/2ML IJ SOLN
INTRAMUSCULAR | Status: DC | PRN
  Administered 2024-09-25: 4 mg via INTRAVENOUS

## 2024-09-25 MED ORDER — POLYETHYLENE GLYCOL 3350 17 G PO PACK
17.0000 g | PACK | Freq: Two times a day (BID) | ORAL | Status: DC
  Administered 2024-09-25 – 2024-09-26 (×2): 17 g via ORAL
  Filled 2024-09-25 (×2): qty 1

## 2024-09-25 MED ORDER — 0.9 % SODIUM CHLORIDE (POUR BTL) OPTIME
TOPICAL | Status: DC | PRN
  Administered 2024-09-25: 1000 mL

## 2024-09-25 MED ORDER — PHENYLEPHRINE 80 MCG/ML (10ML) SYRINGE FOR IV PUSH (FOR BLOOD PRESSURE SUPPORT)
PREFILLED_SYRINGE | INTRAVENOUS | Status: AC
Start: 2024-09-25 — End: 2024-09-25
  Filled 2024-09-25: qty 10

## 2024-09-25 MED ORDER — AMLODIPINE BESYLATE 5 MG PO TABS
5.0000 mg | ORAL_TABLET | Freq: Every day | ORAL | Status: DC
  Administered 2024-09-25: 5 mg via ORAL
  Filled 2024-09-25: qty 1

## 2024-09-25 MED ORDER — LACTATED RINGERS IV SOLN: INTRAVENOUS | Status: DC

## 2024-09-25 MED ORDER — MIDAZOLAM HCL (PF) 2 MG/2ML IJ SOLN: 2.0000 mg | INTRAMUSCULAR | Status: DC

## 2024-09-25 MED ORDER — DIPHENHYDRAMINE HCL 12.5 MG/5ML PO ELIX: 12.5000 mg | ORAL_SOLUTION | ORAL | Status: DC | PRN

## 2024-09-25 MED ORDER — ALUM & MAG HYDROXIDE-SIMETH 200-200-20 MG/5ML PO SUSP: 30.0000 mL | ORAL | Status: DC | PRN

## 2024-09-25 MED ORDER — ORAL CARE MOUTH RINSE: 15.0000 mL | Freq: Once | OROMUCOSAL | Status: AC

## 2024-09-25 MED ORDER — FOLIC ACID 1 MG PO TABS
1.0000 mg | ORAL_TABLET | Freq: Every day | ORAL | Status: DC
  Administered 2024-09-26: 1 mg via ORAL
  Filled 2024-09-25: qty 1

## 2024-09-25 MED ORDER — CEFAZOLIN SODIUM-DEXTROSE 2-4 GM/100ML-% IV SOLN
2.0000 g | Freq: Four times a day (QID) | INTRAVENOUS | Status: AC
  Administered 2024-09-25 (×2): 2 g via INTRAVENOUS
  Filled 2024-09-25 (×2): qty 100

## 2024-09-25 MED ORDER — LEVOTHYROXINE SODIUM 125 MCG PO TABS
125.0000 ug | ORAL_TABLET | Freq: Every day | ORAL | Status: DC
  Administered 2024-09-26: 125 ug via ORAL
  Filled 2024-09-25: qty 1

## 2024-09-25 MED ORDER — SODIUM CHLORIDE 0.9 % IV SOLN: INTRAVENOUS | Status: DC

## 2024-09-25 MED ORDER — OXYCODONE HCL 5 MG/5ML PO SOLN: 5.0000 mg | Freq: Once | ORAL | Status: DC | PRN

## 2024-09-25 MED ORDER — POVIDONE-IODINE 10 % EX SWAB: 2.0000 | Freq: Once | CUTANEOUS | Status: DC

## 2024-09-25 MED ORDER — DEXAMETHASONE SOD PHOSPHATE PF 10 MG/ML IJ SOLN
INTRAMUSCULAR | Status: DC | PRN
  Administered 2024-09-25: 5 mg via INTRAVENOUS

## 2024-09-25 MED ORDER — BUPIVACAINE HCL (PF) 0.5 % IJ SOLN
INTRAMUSCULAR | Status: DC | PRN
  Administered 2024-09-25: 2.4 mL via INTRATHECAL

## 2024-09-25 MED ORDER — RIVAROXABAN 10 MG PO TABS
10.0000 mg | ORAL_TABLET | Freq: Every day | ORAL | Status: DC
Start: 2024-09-26 — End: 2024-09-26
  Administered 2024-09-26: 10 mg via ORAL
  Filled 2024-09-25: qty 1

## 2024-09-25 MED ORDER — PHENOL 1.4 % MT LIQD: 1.0000 | OROMUCOSAL | Status: DC | PRN

## 2024-09-25 MED ORDER — DROPERIDOL 2.5 MG/ML IJ SOLN: 0.6250 mg | Freq: Once | INTRAMUSCULAR | Status: DC | PRN

## 2024-09-25 MED ORDER — PROPOFOL 1000 MG/100ML IV EMUL
INTRAVENOUS | Status: AC
Start: 2024-09-25 — End: 2024-09-25
  Filled 2024-09-25: qty 100

## 2024-09-25 MED ORDER — ONDANSETRON HCL 4 MG/2ML IJ SOLN: 4.0000 mg | Freq: Four times a day (QID) | INTRAMUSCULAR | Status: DC | PRN

## 2024-09-25 MED ORDER — OXYCODONE HCL 5 MG PO TABS
10.0000 mg | ORAL_TABLET | ORAL | Status: DC | PRN
  Administered 2024-09-25 – 2024-09-26 (×3): 10 mg via ORAL
  Filled 2024-09-25 (×2): qty 2

## 2024-09-25 MED ORDER — BUPIVACAINE-EPINEPHRINE (PF) 0.25% -1:200000 IJ SOLN
INTRAMUSCULAR | Status: AC
  Filled 2024-09-25: qty 30

## 2024-09-25 MED ORDER — ONDANSETRON HCL 4 MG PO TABS: 4.0000 mg | ORAL_TABLET | Freq: Four times a day (QID) | ORAL | Status: DC | PRN

## 2024-09-25 MED ORDER — MECLIZINE HCL 25 MG PO TABS: 25.0000 mg | ORAL_TABLET | ORAL | Status: DC | PRN

## 2024-09-25 MED ORDER — DULOXETINE HCL 60 MG PO CPEP
60.0000 mg | ORAL_CAPSULE | Freq: Every day | ORAL | Status: DC
  Administered 2024-09-26: 60 mg via ORAL
  Filled 2024-09-25: qty 1

## 2024-09-25 MED ORDER — PANTOPRAZOLE SODIUM 40 MG PO TBEC
40.0000 mg | DELAYED_RELEASE_TABLET | Freq: Every day | ORAL | Status: DC
  Administered 2024-09-26: 40 mg via ORAL
  Filled 2024-09-25: qty 1

## 2024-09-25 MED ORDER — FENTANYL CITRATE (PF) 50 MCG/ML IJ SOSY
100.0000 ug | PREFILLED_SYRINGE | INTRAMUSCULAR | Status: DC
  Administered 2024-09-25: 50 ug via INTRAVENOUS
  Filled 2024-09-25: qty 2

## 2024-09-25 MED ORDER — SODIUM CHLORIDE 0.9 % IR SOLN
Status: DC | PRN
  Administered 2024-09-25: 1000 mL

## 2024-09-25 MED ORDER — GABAPENTIN 300 MG PO CAPS
300.0000 mg | ORAL_CAPSULE | Freq: Two times a day (BID) | ORAL | Status: DC
  Administered 2024-09-25 – 2024-09-26 (×2): 300 mg via ORAL
  Filled 2024-09-25 (×2): qty 1

## 2024-09-25 MED ORDER — HYDROMORPHONE HCL 1 MG/ML IJ SOLN
INTRAMUSCULAR | Status: AC
  Filled 2024-09-25: qty 1

## 2024-09-25 MED ORDER — PHENYLEPHRINE 80 MCG/ML (10ML) SYRINGE FOR IV PUSH (FOR BLOOD PRESSURE SUPPORT)
PREFILLED_SYRINGE | INTRAVENOUS | Status: DC | PRN
  Administered 2024-09-25: 120 ug via INTRAVENOUS
  Administered 2024-09-25 (×3): 80 ug via INTRAVENOUS
  Administered 2024-09-25: 120 ug via INTRAVENOUS

## 2024-09-25 MED ORDER — CEFAZOLIN SODIUM-DEXTROSE 2-4 GM/100ML-% IV SOLN
2.0000 g | INTRAVENOUS | Status: AC
  Administered 2024-09-25: 2 g via INTRAVENOUS
  Filled 2024-09-25: qty 100

## 2024-09-25 MED ORDER — ACETAMINOPHEN 500 MG PO TABS
1000.0000 mg | ORAL_TABLET | Freq: Once | ORAL | Status: AC
  Administered 2024-09-25: 500 mg via ORAL
  Filled 2024-09-25: qty 2

## 2024-09-25 MED ORDER — CEPHALEXIN 250 MG PO CAPS
250.0000 mg | ORAL_CAPSULE | Freq: Every day | ORAL | Status: DC
  Administered 2024-09-26: 250 mg via ORAL
  Filled 2024-09-25: qty 1

## 2024-09-25 MED ORDER — TRANEXAMIC ACID-NACL 1000-0.7 MG/100ML-% IV SOLN
1000.0000 mg | INTRAVENOUS | Status: AC
Start: 2024-09-25 — End: 2024-09-25
  Administered 2024-09-25: 1000 mg via INTRAVENOUS
  Filled 2024-09-25: qty 100

## 2024-09-25 MED ORDER — SODIUM CHLORIDE (PF) 0.9 % IJ SOLN
INTRAMUSCULAR | Status: AC
  Filled 2024-09-25: qty 50

## 2024-09-25 MED ORDER — SODIUM CHLORIDE (PF) 0.9 % IJ SOLN
INTRAMUSCULAR | Status: DC | PRN
  Administered 2024-09-25: 61 mL

## 2024-09-25 MED ORDER — METOCLOPRAMIDE HCL 5 MG/ML IJ SOLN: 5.0000 mg | Freq: Three times a day (TID) | INTRAMUSCULAR | Status: DC | PRN

## 2024-09-25 MED ORDER — BISACODYL 10 MG RE SUPP: 10.0000 mg | Freq: Every day | RECTAL | Status: DC | PRN

## 2024-09-25 MED ORDER — FENTANYL CITRATE (PF) 50 MCG/ML IJ SOSY: 25.0000 ug | PREFILLED_SYRINGE | INTRAMUSCULAR | Status: DC | PRN

## 2024-09-25 MED ORDER — CHLORHEXIDINE GLUCONATE 0.12 % MT SOLN
15.0000 mL | Freq: Once | OROMUCOSAL | Status: AC
  Administered 2024-09-25: 15 mL via OROMUCOSAL

## 2024-09-25 MED ORDER — TIZANIDINE HCL 2 MG PO TABS
2.0000 mg | ORAL_TABLET | Freq: Three times a day (TID) | ORAL | Status: DC | PRN
  Administered 2024-09-25 – 2024-09-26 (×3): 2 mg via ORAL
  Filled 2024-09-25 (×5): qty 2

## 2024-09-25 MED ORDER — BUPIVACAINE HCL (PF) 0.25 % IJ SOLN
INTRAMUSCULAR | Status: DC | PRN
  Administered 2024-09-25: 20 mL via PERINEURAL

## 2024-09-25 MED ORDER — ACETAMINOPHEN 500 MG PO TABS
1000.0000 mg | ORAL_TABLET | Freq: Four times a day (QID) | ORAL | Status: DC
  Administered 2024-09-25 – 2024-09-26 (×4): 1000 mg via ORAL
  Filled 2024-09-25 (×4): qty 2

## 2024-09-25 MED ORDER — KETOROLAC TROMETHAMINE 30 MG/ML IJ SOLN
INTRAMUSCULAR | Status: AC
  Filled 2024-09-25: qty 1

## 2024-09-25 MED ORDER — HYDRALAZINE HCL 20 MG/ML IJ SOLN: 10.0000 mg | Freq: Four times a day (QID) | INTRAMUSCULAR | Status: DC | PRN

## 2024-09-25 MED ORDER — DEXAMETHASONE SOD PHOSPHATE PF 10 MG/ML IJ SOLN
10.0000 mg | Freq: Once | INTRAMUSCULAR | Status: AC
  Administered 2024-09-26: 10 mg via INTRAVENOUS

## 2024-09-25 MED ORDER — SENNA 8.6 MG PO TABS
2.0000 | ORAL_TABLET | Freq: Every day | ORAL | Status: DC
  Administered 2024-09-25: 17.2 mg via ORAL
  Filled 2024-09-25: qty 2

## 2024-09-25 MED ORDER — ONDANSETRON HCL 4 MG/2ML IJ SOLN
INTRAMUSCULAR | Status: AC
  Filled 2024-09-25: qty 2

## 2024-09-25 MED ORDER — MENTHOL 3 MG MT LOZG: 1.0000 | LOZENGE | OROMUCOSAL | Status: DC | PRN

## 2024-09-25 MED ORDER — METOCLOPRAMIDE HCL 5 MG PO TABS: 5.0000 mg | ORAL_TABLET | Freq: Three times a day (TID) | ORAL | Status: DC | PRN

## 2024-09-25 MED ORDER — HYDROMORPHONE HCL 1 MG/ML IJ SOLN
0.5000 mg | INTRAMUSCULAR | Status: DC | PRN
  Administered 2024-09-25: 1 mg via INTRAVENOUS
  Administered 2024-09-25 (×2): 0.5 mg via INTRAVENOUS
  Filled 2024-09-25: qty 1

## 2024-09-25 MED ORDER — HYDROXYZINE HCL 25 MG PO TABS
25.0000 mg | ORAL_TABLET | Freq: Every day | ORAL | Status: DC
  Administered 2024-09-25: 25 mg via ORAL

## 2024-09-25 MED ORDER — PROPOFOL 500 MG/50ML IV EMUL
INTRAVENOUS | Status: DC | PRN
  Administered 2024-09-25: 70 mg via INTRAVENOUS
  Administered 2024-09-25: 125 ug/kg/min via INTRAVENOUS

## 2024-09-25 MED ORDER — OXYCODONE HCL 5 MG PO TABS
ORAL_TABLET | ORAL | Status: AC
  Filled 2024-09-25: qty 2

## 2024-09-25 MED ORDER — TRANEXAMIC ACID-NACL 1000-0.7 MG/100ML-% IV SOLN
1000.0000 mg | Freq: Once | INTRAVENOUS | Status: AC
  Administered 2024-09-25: 1000 mg via INTRAVENOUS

## 2024-09-25 MED ORDER — DEXAMETHASONE SOD PHOSPHATE PF 10 MG/ML IJ SOLN: 8.0000 mg | Freq: Once | INTRAMUSCULAR | Status: DC

## 2024-09-25 SURGICAL SUPPLY — 43 items
ATTUNE MED ANAT PAT 35 KNEE (Knees) IMPLANT
BAG COUNTER SPONGE SURGICOUNT (BAG) IMPLANT
BAG ZIPLOCK 12X15 (MISCELLANEOUS) ×1 IMPLANT
BASEPLATE TIB CMT FB PCKT SZ3 (Knees) IMPLANT
BLADE SAW SGTL 11.0X1.19X90.0M (BLADE) IMPLANT
BLADE SAW SGTL 13.0X1.19X90.0M (BLADE) ×1 IMPLANT
BNDG ELASTIC 6INX 5YD STR LF (GAUZE/BANDAGES/DRESSINGS) ×1 IMPLANT
BOWL SMART MIX CTS (DISPOSABLE) ×1 IMPLANT
CEMENT HV SMART SET (Cement) ×2 IMPLANT
COMPONENT FEM CMT ATTN NRW 3RT (Joint) IMPLANT
COVER SURGICAL LIGHT HANDLE (MISCELLANEOUS) ×1 IMPLANT
CUFF TRNQT CYL 34X4.125X (TOURNIQUET CUFF) ×1 IMPLANT
DERMABOND ADVANCED .7 DNX12 (GAUZE/BANDAGES/DRESSINGS) ×1 IMPLANT
DRAPE U-SHAPE 47X51 STRL (DRAPES) ×1 IMPLANT
DRESSING AQUACEL AG SP 3.5X10 (GAUZE/BANDAGES/DRESSINGS) ×1 IMPLANT
DURAPREP 26ML APPLICATOR (WOUND CARE) ×2 IMPLANT
ELECT REM PT RETURN 15FT ADLT (MISCELLANEOUS) ×1 IMPLANT
GLOVE BIO SURGEON STRL SZ 6 (GLOVE) ×1 IMPLANT
GLOVE BIOGEL PI IND STRL 6.5 (GLOVE) ×1 IMPLANT
GLOVE BIOGEL PI IND STRL 7.5 (GLOVE) ×1 IMPLANT
GLOVE ORTHO TXT STRL SZ7.5 (GLOVE) ×2 IMPLANT
GOWN STRL REUS W/ TWL LRG LVL3 (GOWN DISPOSABLE) ×2 IMPLANT
INSERT TIB CMT ATTUNE 3X6 RT (Insert) IMPLANT
KIT TURNOVER KIT A (KITS) ×1 IMPLANT
MANIFOLD NEPTUNE II (INSTRUMENTS) ×1 IMPLANT
NDL SAFETY ECLIPSE 18X1.5 (NEEDLE) IMPLANT
NS IRRIG 1000ML POUR BTL (IV SOLUTION) ×1 IMPLANT
PACK TOTAL KNEE CUSTOM (KITS) ×1 IMPLANT
PENCIL SMOKE EVACUATOR (MISCELLANEOUS) ×1 IMPLANT
PIN FIX SIGMA LCS THRD HI (PIN) IMPLANT
PROTECTOR NERVE ULNAR (MISCELLANEOUS) ×1 IMPLANT
SET HNDPC FAN SPRY TIP SCT (DISPOSABLE) ×1 IMPLANT
SET PAD KNEE POSITIONER (MISCELLANEOUS) ×1 IMPLANT
SPIKE FLUID TRANSFER (MISCELLANEOUS) ×2 IMPLANT
SUT MNCRL AB 4-0 PS2 18 (SUTURE) ×1 IMPLANT
SUT STRATAFIX PDS+ 0 24IN (SUTURE) ×1 IMPLANT
SUT VIC AB 1 CT1 36 (SUTURE) ×1 IMPLANT
SUT VIC AB 2-0 CT1 TAPERPNT 27 (SUTURE) ×2 IMPLANT
TOWEL GREEN STERILE FF (TOWEL DISPOSABLE) ×1 IMPLANT
TRAY FOLEY MTR SLVR 14FR STAT (SET/KITS/TRAYS/PACK) IMPLANT
TUBE SUCTION HIGH CAP CLEAR NV (SUCTIONS) ×1 IMPLANT
WATER STERILE IRR 1000ML POUR (IV SOLUTION) ×2 IMPLANT
WRAP KNEE MAXI GEL POST OP (GAUZE/BANDAGES/DRESSINGS) ×1 IMPLANT

## 2024-09-25 NOTE — Transfer of Care (Signed)
 Immediate Anesthesia Transfer of Care Note  Patient: April Berg  Procedure(s) Performed: ARTHROPLASTY, KNEE, TOTAL (Right: Knee)  Patient Location: PACU  Anesthesia Type:Spinal  Level of Consciousness: awake, alert , and oriented  Airway & Oxygen Therapy: Patient Spontanous Breathing and Patient connected to face mask oxygen  Post-op Assessment: Report given to RN  Post vital signs: Reviewed and stable  Last Vitals:  Vitals Value Taken Time  BP    Temp    Pulse    Resp    SpO2      Last Pain:  Vitals:   09/25/24 0931  TempSrc:   PainSc: 0-No pain         Complications: No notable events documented.

## 2024-09-25 NOTE — Care Plan (Signed)
 Ortho Bundle Case Management Note  Patient Details  Name: April Berg MRN: [id number] Date of Birth: 11/12/1946  R TKA on 09/25/24  DCP: Home with husband/ [facility]  DME: RW ordered through [employer]  PT: [facility]                   DME Arranged:  Vannie rolling DME Agency:  [employer]  HH Arranged:    HH Agency:     Additional Comments: Please contact me with any questions of if this plan should need to change.  Lyle Pepper, CCM [facility]  [phone]  Ext. [phone]   09/25/2024, 9:00 AM

## 2024-09-25 NOTE — Anesthesia Procedure Notes (Signed)
 Anesthesia Regional Block: Adductor canal block   Pre-Anesthetic Checklist: , timeout performed,  Correct Patient, Correct Site, Correct Laterality,  Correct Procedure, Correct Position, site marked,  Risks and benefits discussed,  Surgical consent,  Pre-op evaluation,  At surgeon's request and post-op pain management  Laterality: Lower and Right  Prep: chloraprep       Needles:  Injection technique: Single-shot  Needle Type: Echogenic Needle     Needle Length: 9cm  Needle Gauge: 21     Additional Needles:   Procedures:,,,, ultrasound used (permanent image in chart),,    Narrative:  Start time: 09/25/2024 9:23 AM End time: 09/25/2024 9:25 AM Injection made incrementally with aspirations every 5 mL.  Performed by: Personally  Anesthesiologist: Boone Fess, MD  Additional Notes: Patient's chart reviewed and they were deemed appropriate candidate for procedure, per surgeon's request. Patient educated about risks, benefits, and alternatives of the block including but not limited to: temporary or permanent nerve damage, bleeding, infection, damage to surround tissues, block failure, local anesthetic toxicity. Patient expressed understanding. A formal time-out was conducted consistent with institution rules.  Monitors were applied, and minimal sedation used (see nursing record). The site was prepped with skin prep and allowed to dry, and sterile gloves were used. A high frequency linear ultrasound probe with probe cover was utilized throughout. Femoral artery visualized at mid-thigh level, local anesthetic injected anterolateral to it, and echogenic block needle trajectory was monitored throughout. Hydrodissection of saphenous nerve visualized and appeared anatomically normal. Aspiration performed every 5ml. Blood vessels were avoided. All injections were performed without resistance and free of blood and paresthesias. The patient tolerated the procedure well.  Injectate: 20ml 0.25%  bupivacaine 

## 2024-09-25 NOTE — Evaluation (Signed)
 Physical Therapy Evaluation Patient Details Name: April Berg MRN: [id number] DOB: [birth year] Today's Date: 09/25/2024  History of Present Illness  Pt is 78 yo female s/p R TKA on 09/25/24.  Pt with hx including but not limited to arthritis, spinal fusion in lumbar and cervical region, vertigo, anxiety, fibromyalgia, HTN, HLD, migraines, osteoporosis  Clinical Impression  Pt is s/p TKA resulting in the deficits listed below (see PT Problem List). At baseline, pt ambulatory in community and occasionally uses cane due to knee pain.  She lives at [facility] with spouse with plan to return to their SNF at d/c.  Today, pt does report pain at 7/10 but agreeable to therapy.  She needed min A and was able to ambulate 7' with RW.  Pt with good ROM and quad activation.  Expected to progress well with therapy. Pt will benefit from acute skilled PT to increase their independence and safety with mobility to allow discharge.          If plan is discharge home, recommend the following: A little help with walking and/or transfers;A little help with bathing/dressing/bathroom;Assistance with cooking/housework;Help with stairs or ramp for entrance   Can travel by private vehicle        Equipment Recommendations Rolling walker (2 wheels)  Recommendations for Other Services       Functional Status Assessment Patient has had a recent decline in their functional status and demonstrates the ability to make significant improvements in function in a reasonable and predictable amount of time.     Precautions / Restrictions Precautions Precautions: Fall;Knee Restrictions Weight Bearing Restrictions Per Provider Order: Yes RLE Weight Bearing Per Provider Order: Weight bearing as tolerated      Mobility  Bed Mobility Overal bed mobility: Needs Assistance Bed Mobility: Supine to Sit, Sit to Supine     Supine to sit: Contact guard Sit to supine: Min assist   General bed mobility comments: light  min A for R LE    Transfers Overall transfer level: Needs assistance Equipment used: Rolling walker (2 wheels) Transfers: Sit to/from Stand Sit to Stand: Min assist           General transfer comment: light min A and cues for hand placement and R LE management    Ambulation/Gait Ambulation/Gait assistance: Contact guard assist Gait Distance (Feet): 18 Feet Assistive device: Rolling walker (2 wheels) Gait Pattern/deviations: Step-to pattern, Decreased stride length, Decreased weight shift to right Gait velocity: decreased     General Gait Details: cues for sequencing and RW proximty  Stairs            Wheelchair Mobility     Tilt Bed    Modified Rankin (Stroke Patients Only)       Balance Overall balance assessment: Needs assistance Sitting-balance support: No upper extremity supported Sitting balance-Leahy Scale: Good     Standing balance support: Bilateral upper extremity supported, Reliant on assistive device for balance Standing balance-Leahy Scale: Poor                               Pertinent Vitals/Pain Pain Assessment Pain Assessment: 0-10 Pain Score: 7  Pain Location: R knee Pain Descriptors / Indicators: Discomfort, Throbbing Pain Intervention(s): Limited activity within patient's tolerance, Monitored during session, Premedicated before session, Repositioned, Ice applied (more meds due soon per pt - nursing aware)    Home Living Family/patient expects to be discharged to:: Private residence ([facility]F at [facility]) Living Arrangements: Spouse/significant other Available Help at Discharge: Family;Available 24 hours/day Type of Home: Independent living facility Home Access: Level entry       Home Layout: One level Home Equipment: Grab bars - tub/shower;Grab bars - toilet;Shower seat - built in;Cane - single point      Prior Function Prior Level of Function : Independent/Modified Independent;Driving             Mobility  Comments: Could ambulate in community; did use cane occasionally ADLs Comments: independent adls and iadls     Extremity/Trunk Assessment   Upper Extremity Assessment Upper Extremity Assessment: Overall WFL for tasks assessed    Lower Extremity Assessment Lower Extremity Assessment: LLE deficits/detail;RLE deficits/detail RLE Deficits / Details: Expected post op changes; ROM ~5 to 70 degrees; MMT : ankle 5/5, knee and hip 3/5 not further tested LLE Deficits / Details: Reports also painful and may need TKA; ROM is [facility] and MMT 5/5    Cervical / Trunk Assessment Cervical / Trunk Assessment: Normal;Other exceptions Cervical / Trunk Exceptions: hx of cervical and lumbar fusions  Communication        Cognition Arousal: Alert Behavior During Therapy: WFL for tasks assessed/performed   PT - Cognitive impairments: No apparent impairments                                 Cueing       General Comments General comments (skin integrity, edema, etc.): Educated on resting wtih knee straight; encouraged ankle pumps and quad sets tonight    Exercises Total Joint Exercises Ankle Circles/Pumps: AROM, Both, 10 reps, Supine   Assessment/Plan    PT Assessment Patient needs continued PT services  PT Problem List Decreased strength;Decreased range of motion;Decreased activity tolerance;Decreased knowledge of use of DME;Decreased mobility;Decreased balance       PT Treatment Interventions DME instruction;Therapeutic exercise;Gait training;Stair training;Functional mobility training;Therapeutic activities;Patient/family education;Modalities    PT Goals (Current goals can be found in the Care Plan section)  Acute Rehab PT Goals Patient Stated Goal: drive as soon as possible, return to walking PT Goal Formulation: With patient/family Time For Goal Achievement: 10/09/24 Potential to Achieve Goals: Good    Frequency 7X/week     Co-evaluation               AM-PAC PT  6 Clicks Mobility  Outcome Measure Help needed turning from your back to your side while in a flat bed without using bedrails?: None Help needed moving from lying on your back to sitting on the side of a flat bed without using bedrails?: A Little Help needed moving to and from a bed to a chair (including a wheelchair)?: A Little Help needed standing up from a chair using your arms (e.g., wheelchair or bedside chair)?: A Little Help needed to walk in hospital room?: A Little Help needed climbing 3-5 steps with a railing? : A Little 6 Click Score: 19    End of Session Equipment Utilized During Treatment: Gait belt Activity Tolerance: Patient tolerated treatment well Patient left: in bed;with call bell/phone within reach;with bed alarm set;with SCD's reapplied;with family/visitor present Nurse Communication: Mobility status PT Visit Diagnosis: Other abnormalities of gait and mobility (R26.89);Muscle weakness (generalized) (M62.81)    Time: 1553-1610 PT Time Calculation (min) (ACUTE ONLY): 17 min   Charges:   PT Evaluation $PT Eval Low Complexity: 1 Low   PT General Charges $$ ACUTE PT VISIT: 1 Visit  Khiry Pasquariello, PT Acute Rehab [facility] [phone]   Benjiman VEAR Mulberry 09/25/2024, 4:32 PM

## 2024-09-25 NOTE — Discharge Instructions (Addendum)
INSTRUCTIONS AFTER JOINT REPLACEMENT   Remove items at home which could result in a fall. This includes throw rugs or furniture in walking pathways ICE to the affected joint every three hours while awake for 30 minutes at a time, for at least the first 3-5 days, and then as needed for pain and swelling.  Continue to use ice for pain and swelling. You may notice swelling that will progress down to the foot and ankle.  This is normal after surgery.  Elevate your leg when you are not up walking on it.   Continue to use the breathing machine you got in the hospital (incentive spirometer) which will help keep your temperature down.  It is common for your temperature to cycle up and down following surgery, especially at night when you are not up moving around and exerting yourself.  The breathing machine keeps your lungs expanded and your temperature down.   DIET:  As you were doing prior to hospitalization, we recommend a well-balanced diet.  DRESSING / WOUND CARE / SHOWERING  Keep the surgical dressing until follow up.  The dressing is water proof, so you can shower without any extra covering.  IF THE DRESSING FALLS OFF or the wound gets wet inside, change the dressing with sterile gauze.  Please use good hand washing techniques before changing the dressing.  Do not use any lotions or creams on the incision until instructed by your surgeon.    ACTIVITY  Increase activity slowly as tolerated, but follow the weight bearing instructions below.   No driving for 6 weeks or until further direction given by your physician.  You cannot drive while taking narcotics.  No lifting or carrying greater than 10 lbs. until further directed by your surgeon. Avoid periods of inactivity such as sitting longer than an hour when not asleep. This helps prevent blood clots.  You may return to work once you are authorized by your doctor.     WEIGHT BEARING   Weight bearing as tolerated with assist device (walker, cane,  etc) as directed, use it as long as suggested by your surgeon or therapist, typically at least 4-6 weeks.   EXERCISES  Results after joint replacement surgery are often greatly improved when you follow the exercise, range of motion and muscle strengthening exercises prescribed by your doctor. Safety measures are also important to protect the joint from further injury. Any time any of these exercises cause you to have increased pain or swelling, decrease what you are doing until you are comfortable again and then slowly increase them. If you have problems or questions, call your caregiver or physical therapist for advice.   Rehabilitation is important following a joint replacement. After just a few days of immobilization, the muscles of the leg can become weakened and shrink (atrophy).  These exercises are designed to build up the tone and strength of the thigh and leg muscles and to improve motion. Often times heat used for twenty to thirty minutes before working out will loosen up your tissues and help with improving the range of motion but do not use heat for the first two weeks following surgery (sometimes heat can increase post-operative swelling).   These exercises can be done on a training (exercise) mat, on the floor, on a table or on a bed. Use whatever works the best and is most comfortable for you.    Use music or television while you are exercising so that the exercises are a pleasant break in your   day. This will make your life better with the exercises acting as a break in your routine that you can look forward to.   Perform all exercises about fifteen times, three times per day or as directed.  You should exercise both the operative leg and the other leg as well.  Exercises include:   Quad Sets - Tighten up the muscle on the front of the thigh (Quad) and hold for 5-10 seconds.   Straight Leg Raises - With your knee straight (if you were given a brace, keep it on), lift the leg to 60  degrees, hold for 3 seconds, and slowly lower the leg.  Perform this exercise against resistance later as your leg gets stronger.  Leg Slides: Lying on your back, slowly slide your foot toward your buttocks, bending your knee up off the floor (only go as far as is comfortable). Then slowly slide your foot back down until your leg is flat on the floor again.  Angel Wings: Lying on your back spread your legs to the side as far apart as you can without causing discomfort.  Hamstring Strength:  Lying on your back, push your heel against the floor with your leg straight by tightening up the muscles of your buttocks.  Repeat, but this time bend your knee to a comfortable angle, and push your heel against the floor.  You may put a pillow under the heel to make it more comfortable if necessary.   A rehabilitation program following joint replacement surgery can speed recovery and prevent re-injury in the future due to weakened muscles. Contact your doctor or a physical therapist for more information on knee rehabilitation.    CONSTIPATION  Constipation is defined medically as fewer than three stools per week and severe constipation as less than one stool per week.  Even if you have a regular bowel pattern at home, your normal regimen is likely to be disrupted due to multiple reasons following surgery.  Combination of anesthesia, postoperative narcotics, change in appetite and fluid intake all can affect your bowels.   YOU MUST use at least one of the following options; they are listed in order of increasing strength to get the job done.  They are all available over the counter, and you may need to use some, POSSIBLY even all of these options:    Drink plenty of fluids (prune juice may be helpful) and high fiber foods Colace 100 mg by mouth twice a day  Senokot for constipation as directed and as needed Dulcolax (bisacodyl), take with full glass of water  Miralax (polyethylene glycol) once or twice a day as  needed.  If you have tried all these things and are unable to have a bowel movement in the first 3-4 days after surgery call either your surgeon or your primary doctor.    If you experience loose stools or diarrhea, hold the medications until you stool forms back up.  If your symptoms do not get better within 1 week or if they get worse, check with your doctor.  If you experience "the worst abdominal pain ever" or develop nausea or vomiting, please contact the office immediately for further recommendations for treatment.   ITCHING:  If you experience itching with your medications, try taking only a single pain pill, or even half a pain pill at a time.  You can also use Benadryl over the counter for itching or also to help with sleep.   TED HOSE STOCKINGS:  Use stockings on both   legs until for at least 2 weeks or as directed by physician office. They may be removed at night for sleeping.  MEDICATIONS:  See your medication summary on the "After Visit Summary" that nursing will review with you.  You may have some home medications which will be placed on hold until you complete the course of blood thinner medication.  It is important for you to complete the blood thinner medication as prescribed.  PRECAUTIONS:  If you experience chest pain or shortness of breath - call 911 immediately for transfer to the hospital emergency department.   If you develop a fever greater that 101 F, purulent drainage from wound, increased redness or drainage from wound, foul odor from the wound/dressing, or calf pain - CONTACT YOUR SURGEON.                                                   FOLLOW-UP APPOINTMENTS:  If you do not already have a post-op appointment, please call the office for an appointment to be seen by your surgeon.  Guidelines for how soon to be seen are listed in your "After Visit Summary", but are typically between 1-4 weeks after surgery.  OTHER INSTRUCTIONS:   Knee Replacement:  Do not place pillow  under knee, focus on keeping the knee straight while resting. CPM instructions: 0-90 degrees, 2 hours in the morning, 2 hours in the afternoon, and 2 hours in the evening. Place foam block, curve side up under heel at all times except when in CPM or when walking.  DO NOT modify, tear, cut, or change the foam block in any way.  POST-OPERATIVE OPIOID TAPER INSTRUCTIONS: It is important to wean off of your opioid medication as soon as possible. If you do not need pain medication after your surgery it is ok to stop day one. Opioids include: Codeine, Hydrocodone(Norco, Vicodin), Oxycodone(Percocet, oxycontin) and hydromorphone amongst others.  Long term and even short term use of opiods can cause: Increased pain response Dependence Constipation Depression Respiratory depression And more.  Withdrawal symptoms can include Flu like symptoms Nausea, vomiting And more Techniques to manage these symptoms Hydrate well Eat regular healthy meals Stay active Use relaxation techniques(deep breathing, meditating, yoga) Do Not substitute Alcohol to help with tapering If you have been on opioids for less than two weeks and do not have pain than it is ok to stop all together.  Plan to wean off of opioids This plan should start within one week post op of your joint replacement. Maintain the same interval or time between taking each dose and first decrease the dose.  Cut the total daily intake of opioids by one tablet each day Next start to increase the time between doses. The last dose that should be eliminated is the evening dose.   MAKE SURE YOU:  Understand these instructions.  Get help right away if you are not doing well or get worse.    Thank you for letting us be a part of your medical care team.  It is a privilege we respect greatly.  We hope these instructions will help you stay on track for a fast and full recovery!     Information on my medicine - XARELTO (Rivaroxaban)  This  medication education was reviewed with me or my healthcare representative as part of my discharge preparation.      Why was Xarelto prescribed for you? Xarelto was prescribed for you to reduce the risk of blood clots forming after orthopedic surgery. The medical term for these abnormal blood clots is venous thromboembolism (VTE).  What do you need to know about xarelto ? Take your Xarelto ONCE DAILY at the same time every day. You may take it either with or without food.  If you have difficulty swallowing the tablet whole, you may crush it and mix in applesauce just prior to taking your dose.  Take Xarelto exactly as prescribed by your doctor and DO NOT stop taking Xarelto without talking to the doctor who prescribed the medication.  Stopping without other VTE prevention medication to take the place of Xarelto may increase your risk of developing a clot.  After discharge, you should have regular check-up appointments with your healthcare provider that is prescribing your Xarelto.    What do you do if you miss a dose? If you miss a dose, take it as soon as you remember on the same day then continue your regularly scheduled once daily regimen the next day. Do not take two doses of Xarelto on the same day.   Important Safety Information A possible side effect of Xarelto is bleeding. You should call your healthcare provider right away if you experience any of the following: Bleeding from an injury or your nose that does not stop. Unusual colored urine (red or dark brown) or unusual colored stools (red or black). Unusual bruising for unknown reasons. A serious fall or if you hit your head (even if there is no bleeding).  Some medicines may interact with Xarelto and might increase your risk of bleeding while on Xarelto. To help avoid this, consult your healthcare provider or pharmacist prior to using any new prescription or non-prescription medications, including herbals, vitamins,  non-steroidal anti-inflammatory drugs (NSAIDs) and supplements.  This website has more information on Xarelto: www.xarelto.com.   

## 2024-09-25 NOTE — Interval H&P Note (Signed)
 History and Physical Interval Note:  09/25/2024 8:32 AM  April Berg  has presented today for surgery, with the diagnosis of Right knee osteoarthritis.  The various methods of treatment have been discussed with the patient and family. After consideration of risks, benefits and other options for treatment, the patient has consented to  Procedure(s): ARTHROPLASTY, KNEE, TOTAL (Right) as a surgical intervention.  The patient's history has been reviewed, patient examined, no change in status, stable for surgery.  I have reviewed the patient's chart and labs.  Questions were answered to the patient's satisfaction.     Donnice JONETTA Car

## 2024-09-25 NOTE — Anesthesia Procedure Notes (Signed)
 Spinal  Patient location during procedure: OR Start time: 09/25/2024 10:05 AM End time: 09/25/2024 10:08 AM Reason for block: surgical anesthesia Staffing Performed: anesthesiologist  Anesthesiologist: Boone Fess, MD Performed by: Boone Fess, MD Authorized by: Boone Fess, MD   Preanesthetic Checklist Completed: patient identified, IV checked, site marked, risks and benefits discussed, surgical consent, monitors and equipment checked, pre-op evaluation and timeout performed Spinal Block Patient position: sitting Prep: ChloraPrep and site prepped and draped Patient monitoring: heart rate, continuous pulse ox, blood pressure and cardiac monitor Approach: midline Location: L2-3 Injection technique: single-shot Needle Needle type: Quincke  Needle gauge: 22 G Needle length: 9 cm Assessment Sensory level: T10 Events: CSF return Additional Notes Meticulous sterile technique used throughout (CHG prep, sterile gloves, sterile drape). Negative paresthesia. Negative blood return. Positive free-flowing CSF. Expiration date of kit checked and confirmed. Patient tolerated procedure well, without complications.

## 2024-09-25 NOTE — Anesthesia Preprocedure Evaluation (Signed)
 Anesthesia Evaluation  Patient identified by MRN, date of birth, ID band Patient awake  General Assessment Comment:  Hx seizure during a nerve block 30 years ago  Reviewed: Allergy & Precautions, NPO status , Patient's Chart, lab work & pertinent test results  History of Anesthesia Complications (+) PONV and history of anesthetic complications  Airway Mallampati: II  TM Distance: >3 FB Neck ROM: Full    Dental no notable dental hx. (+) Teeth Intact   Pulmonary neg pulmonary ROS, neg sleep apnea, neg COPD, Patient abstained from smoking.Not current smoker   Pulmonary exam normal breath sounds clear to auscultation       Cardiovascular Exercise Tolerance: Good METShypertension, Pt. on medications (-) CAD and (-) Past MI (-) dysrhythmias  Rhythm:Regular Rate:Normal - Systolic murmurs    Neuro/Psych  Headaches PSYCHIATRIC DISORDERS Anxiety Depression       GI/Hepatic hiatal hernia,GERD  Medicated and Controlled,,(+)     (-) substance abuse    Endo/Other  neg diabetesHypothyroidism    Renal/GU negative Renal ROS     Musculoskeletal  (+) Arthritis ,  Fibromyalgia -Denies blood thinner use. Recent labs reviewed   Abdominal   Peds  Hematology   Anesthesia Other Findings Past Medical History: No date: Allergy     Comment:  seasonal and environmental No date: Anxiety No date: Arthritis     Comment:  osteoarthritis No date: Cancer (HCC)     Comment:  melanoma on RLE No date: Cataract 90's: Complication of anesthesia     Comment:  block for elbow surgery and pt had a seizure... surgery               since with no problem  No date: Depression No date: Fibromyalgia No date: GERD (gastroesophageal reflux disease) No date: Glaucoma of both eyes No date: H/O hiatal hernia No date: History of pericarditis     Comment:  DEC 2008 AND SMALL PERICARDIAL EFFUSION--   RESOLVED No date: Hyperlipidemia No date:  Hypertension No date: Hypothyroidism No date: IBS (irritable bowel syndrome) No date: Interstitial cystitis No date: Migraines No date: Osteoporosis No date: Pelvic pain No date: Seizures (HCC)     Comment:  one occurence in the 1990's after anesthesia, no               problems since No date: Spinal stenosis No date: Vitamin D  deficiency  Reproductive/Obstetrics                              Anesthesia Physical Anesthesia Plan  ASA: 2  Anesthesia Plan: Spinal   Post-op Pain Management: Tylenol  PO (pre-op)* and Regional block*   Induction: Intravenous  PONV Risk Score and Plan: 3 and Ondansetron , Dexamethasone , Propofol  infusion, TIVA and Treatment may vary due to age or medical condition  Airway Management Planned: Natural Airway  Additional Equipment: None  Intra-op Plan:   Post-operative Plan:   Informed Consent: I have reviewed the patients History and Physical, chart, labs and discussed the procedure including the risks, benefits and alternatives for the proposed anesthesia with the patient or authorized representative who has indicated his/her understanding and acceptance.       Plan Discussed with: CRNA and Surgeon  Anesthesia Plan Comments: (Discussed neuraxial anesthesia in relation to patient's history of L2-L4 spinal fusion. I did say that it was not a contraindication to neuraxial and if she were amenable, we could certainly try, but that it may increase the  difficulty/risk of unsuccessful spinal placement, with conversion to GETA backup. Discussed R/B/A of neuraxial anesthesia technique with patient: - rare risks of spinal/epidural hematoma, nerve damage, infection - Risk of PDPH - Risk of nausea and vomiting - Risk of conversion to general anesthesia and its associated risks, including sore throat, damage to lips/eyes/teeth/oropharynx, and rare risks such as cardiac and respiratory events. - Risk of allergic reactions  Discussed  r/b/a of adductor canal nerve block, including:  - bleeding, infection, nerve damage - poor or non functioning block. - reactions and toxicity to local anesthetic Patient understands.  Discussed the role of CRNA in patient's perioperative care.  Patient voiced understanding.)         Anesthesia Quick Evaluation

## 2024-09-25 NOTE — Anesthesia Postprocedure Evaluation (Signed)
 Anesthesia Post Note  Patient: April Berg  Procedure(s) Performed: ARTHROPLASTY, KNEE, TOTAL (Right: Knee)     Patient location during evaluation: PACU Anesthesia Type: Spinal Level of consciousness: oriented and awake and alert Pain management: pain level controlled Vital Signs Assessment: post-procedure vital signs reviewed and stable Respiratory status: spontaneous breathing, respiratory function stable and patient connected to nasal cannula oxygen Cardiovascular status: blood pressure returned to baseline and stable Postop Assessment: no headache, no backache, no apparent nausea or vomiting and spinal receding Anesthetic complications: no   No notable events documented.  Last Vitals:  Vitals:   09/25/24 1245 09/25/24 1330  BP: (!) 163/72 (!) 160/99  Pulse: 80 93  Resp: 17 18  Temp:    SpO2: 94% 97%    Last Pain:  Vitals:   09/25/24 1356  TempSrc:   PainSc: 6                  [name]

## 2024-09-25 NOTE — Op Note (Signed)
 NAME:  April Berg [facility]                      MEDICAL RECORD NO.:  [id number]                             FACILITY:  [facility]      PHYSICIAN:  Donnice BIRCH. Ernie, M.D.  DATE OF BIRTH:  1945/11/26      DATE OF PROCEDURE:  09/25/2024                                     OPERATIVE REPORT         PREOPERATIVE DIAGNOSIS:  Right knee osteoarthritis.      POSTOPERATIVE DIAGNOSIS:  Right knee osteoarthritis.      FINDINGS:  The patient was noted to have complete loss of cartilage and   bone-on-bone arthritis with associated osteophytes in the medial and patellofemoral compartments of   the knee with a significant synovitis and associated effusion.  The patient had failed months of conservative treatment including medications, injection therapy, activity modification.     PROCEDURE:  Right total knee replacement.      COMPONENTS USED:  DePuy Attune FB CR MS knee   system, a size 3N femur, 3 tibia, size 6 mm CR MS AOX insert, and 35 anatomic patellar   button.      SURGEON:  Donnice BIRCH. Ernie, M.D.      ASSISTANT:  Rosina Calin, PA-C.      ANESTHESIA:  Regional and Spinal.      SPECIMENS:  None.      COMPLICATION:  None.      DRAINS:  None.  EBL: <250 cc      TOURNIQUET TIME:  No tourniquet was used     The patient was stable to the recovery room.      INDICATION FOR PROCEDURE:  April Berg is a 78 y.o. female patient of   mine.  The patient had been seen, evaluated, and treated for months conservatively in the   office with medication, activity modification, and injections.  The patient had   radiographic changes of bone-on-bone arthritis with endplate sclerosis and osteophytes noted.  Based on the radiographic changes and failed conservative measures, the patient   decided to proceed with definitive treatment, total knee replacement.  Risks of infection, DVT, component failure, need for revision surgery, neurovascular injury were reviewed in the office setting.  The postop course  was reviewed stressing the efforts to maximize post-operative satisfaction and function.  Consent was obtained for benefit of pain   relief.      PROCEDURE IN DETAIL:  The patient was brought to the operative theater.   Once adequate anesthesia, preoperative antibiotics, 2 gm of Ancef ,1 gm of Tranexamic Acid, and 10 mg of Decadron  administered, the patient was positioned supine with a right thigh tourniquet placed.  The  right lower extremity was prepped and draped in sterile fashion.  A time-   out was performed identifying the patient, planned procedure, and the appropriate extremity.      The right lower extremity was placed in the [facility] leg holder.  A midline incision was   made followed by median parapatellar arthrotomy.  Following initial   exposure, attention was first directed to the patella.  Precut   measurement was noted to  be 21 mm.  I resected down to 13 mm and used a   35 anatomic patellar button to restore patellar height as well as cover the cut surface.      The lug holes were drilled and a metal shim was placed to protect the   patella from retractors and saw blade during the procedure.      At this point, attention was now directed to the femur.  The femoral   canal was opened with a drill, irrigated to try to prevent fat emboli.  An   intramedullary rod was passed at 3 degrees valgus, 8 mm of bone was   resected off the distal femur.  Following this resection, the tibia was   subluxated anteriorly.  Using the extramedullary guide, 2 mm of bone was resected off   the proximal medial tibia.  We confirmed the gap would be   stable medially and laterally with a size 5 spacer block as well as confirmed that the tibial cut was perpendicular in the coronal plane, checking with an alignment rod.      Once this was done, I sized the femur to be a size 3 in the anterior-   posterior dimension, chose a narrow component based on medial and   lateral dimension.  The size 3 rotation  block was then pinned in   position anterior referenced using the C-clamp to set rotation.  The   anterior, posterior, and  chamfer cuts were made without difficulty nor   notching making certain that I was along the anterior cortex to help   with flexion gap stability.      The final box cut was made off the lateral aspect of distal femur.      At this point, the tibia was sized to be a size 3.  The size 3 tray was   then pinned in position through the medial third of the tubercle,   drilled, and keel punched.  Trial reduction was now carried with a 3 femur,  3 tibia, a size 6 mm CR insert, and the 35 anatomic patella botton.  The knee was brought to full extension with good flexion stability with the patella   tracking through the trochlea without application of pressure.  Given   all these findings the trial components removed.  Final components were   opened and cement was mixed.  The knee was irrigated with normal saline solution and pulse lavage.  The synovial lining was   then injected with 30 cc of 0.25% Marcaine  with epinephrine , 1 cc of Toradol and 30 cc of NS for a total of 61 cc.     Final implants were then cemented onto cleaned and dried cut surfaces of bone with the knee brought to extension with a size 6 mm CR trial insert.      Once the cement had fully cured, excess cement was removed   throughout the knee.  I confirmed that I was satisfied with the range of   motion and stability, and the final size 6 mm CR MS AOX insert was chosen.  It was   placed into the knee.      At this point in the case no significant   hemostasis was required.  The extensor mechanism was then reapproximated using #1 Vicryl and #1 Stratafix sutures with the knee   in flexion.  The   remaining wound was closed with 2-0 Vicryl and running 4-0 Monocryl.   The knee  was cleaned, dried, dressed sterilely using Dermabond and   Aquacel dressing.  The patient was then   brought to recovery room in  stable condition, tolerating the procedure   well.   Please note that Physician Assistant, Rosina Calin, PA-C was present for the entirety of the case, and was utilized for pre-operative positioning, peri-operative retractor management, general facilitation of the procedure and for primary wound closure at the end of the case.              Donnice CORDOBA Ernie, M.D.    09/25/2024 8:40 AM

## 2024-09-26 ENCOUNTER — Encounter (HOSPITAL_COMMUNITY): Payer: Self-pay | Admitting: Orthopedic Surgery

## 2024-09-26 DIAGNOSIS — I10 Essential (primary) hypertension: Secondary | ICD-10-CM | POA: Diagnosis not present

## 2024-09-26 DIAGNOSIS — Z79899 Other long term (current) drug therapy: Secondary | ICD-10-CM | POA: Diagnosis not present

## 2024-09-26 DIAGNOSIS — Z85828 Personal history of other malignant neoplasm of skin: Secondary | ICD-10-CM | POA: Diagnosis not present

## 2024-09-26 DIAGNOSIS — E039 Hypothyroidism, unspecified: Secondary | ICD-10-CM | POA: Diagnosis not present

## 2024-09-26 DIAGNOSIS — M1711 Unilateral primary osteoarthritis, right knee: Secondary | ICD-10-CM | POA: Diagnosis not present

## 2024-09-26 LAB — CBC
HCT: 36.3 % (ref 36.0–46.0)
Hemoglobin: 11.2 g/dL — ABNORMAL LOW (ref 12.0–15.0)
MCH: 28.6 pg (ref 26.0–34.0)
MCHC: 30.9 g/dL (ref 30.0–36.0)
MCV: 92.6 fL (ref 80.0–100.0)
Platelets: 261 K/uL (ref 150–400)
RBC: 3.92 MIL/uL (ref 3.87–5.11)
RDW: 15.3 % (ref 11.5–15.5)
WBC: 10.1 K/uL (ref 4.0–10.5)
nRBC: 0 % (ref 0.0–0.2)

## 2024-09-26 LAB — BASIC METABOLIC PANEL WITH GFR
Anion gap: 11 (ref 5–15)
BUN: 14 mg/dL (ref 8–23)
CO2: 21 mmol/L — ABNORMAL LOW (ref 22–32)
Calcium: 8.9 mg/dL (ref 8.9–10.3)
Chloride: 103 mmol/L (ref 98–111)
Creatinine, Ser: 0.87 mg/dL (ref 0.44–1.00)
GFR, Estimated: >60 mL/min (ref >60)
Glucose, Bld: 128 mg/dL — ABNORMAL HIGH (ref 70–99)
Potassium: 4.6 mmol/L (ref 3.5–5.1)
Sodium: 135 mmol/L (ref 135–145)

## 2024-09-26 MED ORDER — SENNA 8.6 MG PO TABS: 2.0000 | ORAL_TABLET | Freq: Every day | ORAL | 0 refills | Status: AC

## 2024-09-26 MED ORDER — TIZANIDINE HCL 2 MG PO TABS: 2.0000 mg | ORAL_TABLET | Freq: Three times a day (TID) | ORAL | 0 refills | Status: DC | PRN

## 2024-09-26 MED ORDER — OXYCODONE HCL 5 MG PO TABS: 5.0000 mg | ORAL_TABLET | ORAL | 0 refills | Status: DC | PRN

## 2024-09-26 MED ORDER — RIVAROXABAN 10 MG PO TABS: 10.0000 mg | ORAL_TABLET | Freq: Every day | ORAL | 0 refills | Status: AC

## 2024-09-26 MED ORDER — POLYETHYLENE GLYCOL 3350 17 G PO PACK: 17.0000 g | PACK | Freq: Two times a day (BID) | ORAL | 0 refills | Status: AC

## 2024-09-26 MED ORDER — ACETAMINOPHEN 500 MG PO TABS: 1000.0000 mg | ORAL_TABLET | Freq: Four times a day (QID) | ORAL | Status: AC

## 2024-09-26 NOTE — Care Management Obs Status (Signed)
 MEDICARE OBSERVATION STATUS NOTIFICATION   Patient Details  Name: SANDA DEJOY MRN: [id number] Date of Birth: [birth year]   Medicare Observation Status Notification Given:  Yes    Alfonse JONELLE Rex, RN 09/26/2024, 10:06 AM

## 2024-09-26 NOTE — TOC Transition Note (Signed)
 Transition of Care [facility]) - Discharge Note   Patient Details  Name: April Berg MRN: [id number] Date of Birth: [birth year]  [facility] CM/SW Contact:  Alfonse JONELLE Rex, RN Phone Number: 09/26/2024, 12:32 PM   Clinical Narrative:  Patient resides at [facility], dc plan for rehab at facility. Call to [facility], admit coord w/[facility]. Confirmed patient is ILF resident with plans for return to rehab unit at facility.  Patient reports she has a RW, declined delivery of RW by [employer]. [facility] updated. Spouse confirmed he will transport patient to facility. No further TOC needs identified at this time.      Final next level of care: Skilled Nursing Facility Barriers to Discharge: Barriers Resolved   Patient Goals and CMS Choice Patient states their goals for this hospitalization and ongoing recovery are:: return to rehab at [facility] -resides in ILF CMS Medicare.gov Compare Post Acute Care list provided to:: Patient Choice offered to / list presented to : Patient Offerle ownership interest in [facility].provided to:: Patient    Discharge Placement                    Patient and family notified of of transfer: 09/26/24  Discharge Plan and Services Additional resources added to the After Visit Summary for                  DME Arranged: Walker rolling DME Agency: [employer]                  Social Drivers of Health (SDOH) Interventions SDOH Screenings   Food Insecurity: No Food Insecurity (09/25/2024)  Housing: Low Risk  (09/25/2024)  Transportation Needs: No Transportation Needs (09/25/2024)  Utilities: Not At Risk (09/25/2024)  Depression (PHQ2-9): Low Risk  (12/01/2022)  Financial Resource Strain: Low Risk  (12/02/2022)   Received from [facility]  Social Connections: Unknown (09/25/2024)  Tobacco Use: Low Risk  (09/25/2024)     Readmission Risk Interventions     No data to display

## 2024-09-26 NOTE — Progress Notes (Signed)
 Physical Therapy Treatment Patient Details Name: April Berg MRN: [id number] DOB: Mar 16, 1946 Today's Date: 09/26/2024   History of Present Illness Pt is 78 yo female s/p R TKA on 09/25/24.  Pt with hx including but not limited to arthritis, spinal fusion in lumbar and cervical region, vertigo, anxiety, fibromyalgia, HTN, HLD, migraines, osteoporosis    PT Comments  Pt is POD # 1 and is progressing well.  Pt with excellent ROM, quad activation, and pain control.  She was able to ambulate 180' with RW and CGA.  Pt has no steps and plans to return to rehab at [facility] at d/c. Pt demonstrates safe gait & transfers in order to return home from PT perspective once discharged by MD.  While in hospital, will continue to benefit from PT for skilled therapy to advance mobility and exercises.       If plan is discharge home, recommend the following: A little help with walking and/or transfers;A little help with bathing/dressing/bathroom;Assistance with cooking/housework;Help with stairs or ramp for entrance   Can travel by private vehicle        Equipment Recommendations  Other (comment) (now reports brand new walker in storage)    Recommendations for Other Services       Precautions / Restrictions Precautions Precautions: Fall;Knee Restrictions RLE Weight Bearing Per Provider Order: Weight bearing as tolerated     Mobility  Bed Mobility Overal bed mobility: Needs Assistance Bed Mobility: Supine to Sit     Supine to sit: Supervision          Transfers Overall transfer level: Needs assistance Equipment used: Rolling walker (2 wheels) Transfers: Sit to/from Stand Sit to Stand: Contact guard assist           General transfer comment: STS x 3  from bed, slight posterior lean initially that improved with trials, CGA for safety    Ambulation/Gait Ambulation/Gait assistance: Contact guard assist, Supervision Gait Distance (Feet): 180 Feet Assistive device: Rolling walker  (2 wheels) Gait Pattern/deviations: Step-to pattern, Step-through pattern, Decreased stride length, Decreased weight shift to right Gait velocity: functional     General Gait Details: Started CGA progressed to supervision, pt also progressed from step to to step through with cues and tolerated well   Stairs             Wheelchair Mobility     Tilt Bed    Modified Rankin (Stroke Patients Only)       Balance Overall balance assessment: Needs assistance Sitting-balance support: No upper extremity supported Sitting balance-Leahy Scale: Good     Standing balance support: Bilateral upper extremity supported, No upper extremity supported Standing balance-Leahy Scale: Fair Standing balance comment: RW to ambulate, once standing and balanced could use UE to pull up pants                            Communication    Cognition Arousal: Alert Behavior During Therapy: WFL for tasks assessed/performed   PT - Cognitive impairments: No apparent impairments                                Cueing    Exercises Total Joint Exercises Ankle Circles/Pumps: AROM, Both, 10 reps, Supine Quad Sets: AROM, Both, 10 reps, Supine Heel Slides: AAROM, Right, 10 reps, Supine Hip ABduction/ADduction: AAROM, Right, 10 reps, Supine Long Arc Quad: AROM, Right, 10 reps, Seated Knee Flexion:  AROM, Right, 10 reps, Seated Goniometric ROM: R knee ~3 to 90 degrees    General Comments   Educated on safe ice use, no pivots, car transfers, resting with leg straight, and TED hose during day. Also, encouraged walking every 1-2 hours during day. Educated on HEP with focus on mobility the first weeks. Discussed doing exercises within pain control and if pain increasing could decreased ROM, reps, and stop exercises as needed. Encouraged to perform quad sets and ankle pumps frequently for blood flow and to promote full knee extension.      Pertinent Vitals/Pain Pain Assessment Pain  Assessment: 0-10 Pain Score: 3  Pain Location: R knee Pain Descriptors / Indicators: Discomfort Pain Intervention(s): Limited activity within patient's tolerance, Monitored during session, Premedicated before session, Repositioned, Ice applied    Home Living                          Prior Function            PT Goals (current goals can now be found in the care plan section) Progress towards PT goals: Progressing toward goals    Frequency    7X/week      PT Plan      Co-evaluation              AM-PAC PT 6 Clicks Mobility   Outcome Measure  Help needed turning from your back to your side while in a flat bed without using bedrails?: None Help needed moving from lying on your back to sitting on the side of a flat bed without using bedrails?: A Little Help needed moving to and from a bed to a chair (including a wheelchair)?: A Little Help needed standing up from a chair using your arms (e.g., wheelchair or bedside chair)?: A Little Help needed to walk in hospital room?: A Little Help needed climbing 3-5 steps with a railing? : A Little 6 Click Score: 19    End of Session Equipment Utilized During Treatment: Gait belt Activity Tolerance: Patient tolerated treatment well Patient left: with call bell/phone within reach;with family/visitor present;in chair Nurse Communication: Mobility status PT Visit Diagnosis: Other abnormalities of gait and mobility (R26.89);Muscle weakness (generalized) (M62.81)     Time: 8994-8954 PT Time Calculation (min) (ACUTE ONLY): 40 min  Charges:    $Gait Training: 8-22 mins $Therapeutic Exercise: 8-22 mins $Therapeutic Activity: 8-22 mins PT General Charges $$ ACUTE PT VISIT: 1 Visit                     Benjiman, PT Acute Rehab [facility] [phone]    Benjiman VEAR Mulberry 09/26/2024, 12:23 PM

## 2024-09-26 NOTE — [place] FL2 (Signed)
 [place]  MEDICAID FL2 LEVEL OF CARE FORM     IDENTIFICATION  Patient Name: April Berg Birthdate: 1946/07/25 Sex: female Admission Date (Current Location): 09/25/2024  [facility] and [place] Number:  Producer, Television/film/video and Address:  [facility],  [address] [place], [place] [address]      Provider Number: [id number]  Attending Physician Name and Address:  Ernie Cough, MD  Relative Name and Phone Number:  Chance,David Dr. (Spouse)  [phone] (Mobile)    Current Level of Care: Hospital Recommended Level of Care: Skilled Nursing Facility Prior Approval Number:    Date Approved/Denied:   PASRR Number:    Discharge Plan: SNF    Current Diagnoses: Patient Active Problem List   Diagnosis Date Noted   S/P total knee arthroplasty, right 09/25/2024   S/P total knee replacement, right 09/25/2024   Ingrown toenail 09/11/2018   Pain of left hand 05/10/2018   Bilateral shoulder pain 03/22/2018   Fusion of spine of lumbosacral region 03/25/2016   Choroidal nevus, left 12/16/2015   Epiretinal membrane (ERM) of left eye 12/16/2015   Intermediate stage nonexudative age-related macular degeneration of both eyes 12/16/2015   Pseudophakia of both eyes 12/16/2015   Vertigo 01/30/2015   Neck pain 01/30/2015   Other cervical disc degeneration, unspecified cervical region 01/20/2015   Laryngopharyngeal reflux (LPR) 08/15/2013   Cervical radiculopathy 10/03/2012   Balance problems 07/10/2012   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 07/10/2012   Tinnitus 07/10/2012   Allergic rhinitis 05/02/2012   Chronic sinusitis 05/02/2012   Facet arthritis of cervical region 12/08/2011   ABDOMINAL PAIN-RUQ 03/26/2010   EPIGASTRIC PAIN 03/23/2010   NONSPECIFIC ABNORMAL FIND RAD&OTH EXAM GI TRACT 07/10/2008   Hypothyroidism 07/09/2008   Major depression 07/09/2008   Unspecified glaucoma  07/09/2008   GERD 07/09/2008   IRRITABLE BOWEL SYNDROME 07/09/2008   Arthropathy 07/09/2008   CHEST PAIN 07/09/2008   Glaucoma 07/09/2008    Orientation RESPIRATION BLADDER Height & Weight     Self, Time, Situation, Place  Normal Continent Weight: 74.8 kg Height:  5' 3 (160 cm)  BEHAVIORAL SYMPTOMS/MOOD NEUROLOGICAL BOWEL NUTRITION STATUS      Continent Diet (regular)  AMBULATORY STATUS COMMUNICATION OF NEEDS Skin   Limited Assist Verbally Surgical wounds (Right TKA)                       Personal Care Assistance Level of Assistance  Bathing, Feeding, Dressing Bathing Assistance: Limited assistance Feeding assistance: Limited assistance Dressing Assistance: Limited assistance     Functional Limitations Info  Sight, Hearing, Speech Sight Info: Adequate Hearing Info: Adequate Speech Info: Adequate    SPECIAL CARE FACTORS FREQUENCY  PT (By licensed PT), OT (By licensed OT)     PT Frequency: 5x/wk OT Frequency: 5x/wk            Contractures Contractures Info: Not present    Additional Factors Info  Code Status, Allergies, Psychotropic Code Status Info: Full  Code Allergies Info: Aspirin, Avelox (Moxifloxacin), Moxifloxacin Hcl In Nacl, Nitrofurantoin, Nsaids, Sulfa Antibiotics, Chlorthalidone, Hydrochlorothiazide Psychotropic Info: see MAR         Current Medications (09/26/2024):  This is the current hospital active medication list Current Facility-Administered Medications  Medication Dose Route Frequency Provider Last Rate Last Admin   0.9 %  sodium chloride  infusion   Intravenous Continuous Patti Rosina SAUNDERS, PA-C   Stopped at 09/26/24  0645   acetaminophen  (TYLENOL ) tablet 1,000 mg  1,000 mg Oral Q6H Patti Rosina SAUNDERS, PA-C   1,000 mg at 09/26/24 0350   alum & mag hydroxide-simeth (MAALOX/MYLANTA) 200-200-20 MG/5ML suspension 30 mL  30 mL Oral Q4H PRN Patti Rosina SAUNDERS, PA-C       amLODipine  (NORVASC ) tablet 5 mg  5 mg Oral QHS Patti Rosina SAUNDERS, PA-C    5 mg at 09/25/24 2037   bisacodyl (DULCOLAX) suppository 10 mg  10 mg Rectal Daily PRN Patti Rosina SAUNDERS, PA-C       cephALEXin  (KEFLEX ) capsule 250 mg  250 mg Oral Daily Patti Rosina SAUNDERS, PA-C   250 mg at 09/26/24 0827   diphenhydrAMINE  (BENADRYL ) 12.5 MG/5ML elixir 12.5-25 mg  12.5-25 mg Oral Q4H PRN Patti Rosina SAUNDERS, PA-C       DULoxetine  (CYMBALTA ) DR capsule 60 mg  60 mg Oral Daily Patti Rosina SAUNDERS, PA-C   60 mg at 09/26/24 0827   folic acid (FOLVITE) tablet 1 mg  1 mg Oral Daily Donovan, Ashley R, PA-C   1 mg at 09/26/24 0827   gabapentin  (NEURONTIN ) capsule 300 mg  300 mg Oral BID Donovan, Ashley R, PA-C   300 mg at 09/26/24 0826   hydrALAZINE (APRESOLINE) injection 10 mg  10 mg Intravenous Q6H PRN Patti Rosina SAUNDERS, PA-C       HYDROmorphone  (DILAUDID ) injection 0.5-1 mg  0.5-1 mg Intravenous Q4H PRN Patti Rosina SAUNDERS, PA-C   1 mg at 09/25/24 2315   hydrOXYzine  (ATARAX ) tablet 25 mg  25 mg Oral QHS Donovan, Ashley R, PA-C   25 mg at 09/25/24 2037   levothyroxine  (SYNTHROID ) tablet 125 mcg  125 mcg Oral QAC breakfast Patti Rosina SAUNDERS, PA-C   125 mcg at 09/26/24 0351   meclizine  (ANTIVERT ) tablet 25 mg  25 mg Oral PRN Patti Rosina SAUNDERS, PA-C       menthol  (CEPACOL) lozenge 3 mg  1 lozenge Oral PRN Patti Rosina SAUNDERS, PA-C       Or   phenol (CHLORASEPTIC) mouth spray 1 spray  1 spray Mouth/Throat PRN Patti Rosina SAUNDERS, PA-C       metoCLOPramide  (REGLAN ) tablet 5-10 mg  5-10 mg Oral Q8H PRN Patti Rosina SAUNDERS, PA-C       Or   metoCLOPramide  (REGLAN ) injection 5-10 mg  5-10 mg Intravenous Q8H PRN Patti Rosina SAUNDERS, PA-C       ondansetron  (ZOFRAN ) tablet 4 mg  4 mg Oral Q6H PRN Patti Rosina SAUNDERS, PA-C       Or   ondansetron  (ZOFRAN ) injection 4 mg  4 mg Intravenous Q6H PRN Patti Rosina SAUNDERS, PA-C       oxyCODONE  (Oxy IR/ROXICODONE ) immediate release tablet 10-15 mg  10-15 mg Oral Q4H PRN Patti Rosina SAUNDERS, PA-C   10 mg at 09/26/24 9171   oxyCODONE  (Oxy IR/ROXICODONE ) immediate release tablet  5-10 mg  5-10 mg Oral Q4H PRN Patti Rosina SAUNDERS, PA-C   10 mg at 09/26/24 0350   pantoprazole  (PROTONIX ) EC tablet 40 mg  40 mg Oral Daily Patti Rosina SAUNDERS, PA-C   40 mg at 09/26/24 0827   polyethylene glycol (MIRALAX  / GLYCOLAX ) packet 17 g  17 g Oral BID Patti Rosina SAUNDERS, PA-C   17 g at 09/26/24 9171   rivaroxaban (XARELTO) tablet 10 mg  10 mg Oral Q breakfast Patti Rosina SAUNDERS, PA-C   10 mg at 09/26/24 0828   senna (SENOKOT) tablet 17.2 mg  2 tablet Oral QHS Patti,  Rosina SAUNDERS, PA-C   17.2 mg at 09/25/24 2037   tiZANidine  (ZANAFLEX ) tablet 2-4 mg  2-4 mg Oral Q8H PRN Donovan, Ashley R, PA-C   2 mg at 09/25/24 2038     Discharge Medications: Please see discharge summary for a list of discharge medications.  Relevant Imaging Results:  Relevant Lab Results:   Additional Information SSN: [id number]  Alfonse SAUNDERS Rex, RN

## 2024-09-26 NOTE — Discharge Summary (Signed)
 Patient ID: April Berg MRN: [id number] DOB/AGE: 11-25-1945 78 y.o.  Admit date: 09/25/2024 Discharge date: 09/26/24  Admission Diagnoses:  Right knee osteoarthritis  Discharge Diagnoses:  Principal Problem:   S/P total knee replacement, right Active Problems:   S/P total knee arthroplasty, right   Past Medical History:  Diagnosis Date   Allergy    seasonal and environmental   Anxiety    Arthritis    osteoarthritis   Cancer (HCC)    melanoma on RLE   Cataract    Complication of anesthesia 90's   block for elbow surgery and pt had a seizure... surgery since with no problem    Depression    Fibromyalgia    GERD (gastroesophageal reflux disease)    Glaucoma of both eyes    H/O hiatal hernia    History of pericarditis    DEC 2008 AND SMALL PERICARDIAL EFFUSION--   RESOLVED   Hyperlipidemia    Hypertension    Hypothyroidism    IBS (irritable bowel syndrome)    Interstitial cystitis    Migraines    Osteoporosis    Pelvic pain    Seizures (HCC)    one occurence in the 1990's after anesthesia, no problems since   Spinal stenosis    Vitamin D  deficiency     Surgeries: Procedure(s): ARTHROPLASTY, KNEE, TOTAL on 09/25/2024   Consultants:   Discharged Condition: Improved  Hospital Course: April Berg is an 78 y.o. female who was admitted 09/25/2024 for operative treatment ofS/P total knee replacement, right. Patient has severe unremitting pain that affects sleep, daily activities, and work/hobbies. After pre-op clearance the patient was taken to the operating room on 09/25/2024 and underwent  Procedure(s): ARTHROPLASTY, KNEE, TOTAL.    Patient was given perioperative antibiotics:  Anti-infectives (From admission, onward)    Start     Dose/Rate Route Frequency Ordered Stop   09/26/24 1000  cephALEXin  (KEFLEX ) capsule 250 mg        250 mg Oral Daily 09/25/24 1518     09/25/24 1600  ceFAZolin  (ANCEF ) IVPB 2g/100 mL premix        2 g 200 mL/hr over 30 Minutes  Intravenous Every 6 hours 09/25/24 1518 09/26/24 0735   09/25/24 0830  ceFAZolin  (ANCEF ) IVPB 2g/100 mL premix        2 g 200 mL/hr over 30 Minutes Intravenous On call to O.R. 09/25/24 0733 09/25/24 1021        Patient was given sequential compression devices, early ambulation, and chemoprophylaxis to prevent DVT. Patient worked with PT and was meeting their goals regarding safe ambulation and transfers.  Patient benefited maximally from hospital stay and there were no complications.    Recent vital signs: Patient Vitals for the past 24 hrs:  BP Temp Temp src Pulse Resp SpO2  09/26/24 0953 (!) 145/77 97.6 F (36.4 C) Oral 74 17 99 %  09/26/24 0656 121/67 98.7 F (37.1 C) Oral 70 16 93 %  09/26/24 0148 125/60 98.6 F (37 C) Oral 79 16 94 %  09/25/24 2258 138/77 97.7 F (36.5 C) Oral 79 15 95 %  09/25/24 1700 (!) 122/57 -- -- 70 -- --  09/25/24 1519 (!) 176/82 97.9 F (36.6 C) Oral 96 18 94 %  09/25/24 1445 (!) 116/92 -- -- -- -- --  09/25/24 1430 (!) 155/74 -- -- 85 13 100 %  09/25/24 1330 (!) 160/99 -- -- 93 18 97 %  09/25/24 1245 (!) 163/72 -- -- 80 17 94 %  09/25/24 1230 (!) 149/73 -- -- 77 20 95 %  09/25/24 1215 (!) 153/70 -- -- 82 20 95 %  09/25/24 1200 137/71 -- -- 71 14 97 %  09/25/24 1145 105/72 -- -- 77 17 98 %  09/25/24 1130 129/68 97.6 F (36.4 C) -- 71 16 100 %  09/25/24 1119 -- -- -- -- -- 95 %     Recent laboratory studies:  Recent Labs    09/26/24 0358  WBC 10.1  HGB 11.2*  HCT 36.3  PLT 261  NA 135  K 4.6  CL 103  CO2 21*  BUN 14  CREATININE 0.87  GLUCOSE 128*  CALCIUM  8.9     Discharge Medications:   Allergies as of 09/26/2024       Reactions   Aspirin Other (See Comments)   Chest pain   Avelox [moxifloxacin] Hives   Moxifloxacin Hcl In Nacl Diarrhea, Hives   Nitrofurantoin Diarrhea   Other Reaction(s): GI Intolerance   Nsaids Other (See Comments)   Chest pain   Sulfa Antibiotics Hives, Other (See Comments)   Chest pain    Chlorthalidone Other (See Comments)   Unknown    Hydrochlorothiazide    unknown        Medication List     STOP taking these medications    albuterol 108 (90 Base) MCG/ACT inhaler Commonly known as: VENTOLIN HFA   diclofenac  Sodium 1 % Gel Commonly known as: VOLTAREN    oxyCODONE -acetaminophen  5-325 MG tablet Commonly known as: Percocet       TAKE these medications    acetaminophen  500 MG tablet Commonly known as: TYLENOL  Take 2 tablets (1,000 mg total) by mouth every 6 (six) hours. What changed: when to take this   acidophilus Caps capsule Take 1 capsule by mouth daily.   amLODipine  5 MG tablet Commonly known as: NORVASC  TAKE ONE TABLET BY MOUTH ONCE DAILY What changed: when to take this   brimonidine 0.2 % ophthalmic solution Commonly known as: ALPHAGAN Place 1 drop into both eyes in the morning and at bedtime.   celecoxib 100 MG capsule Commonly known as: CELEBREX Take 100 mg by mouth daily as needed (headache).   cephALEXin  250 MG capsule Commonly known as: KEFLEX  Take 250 mg by mouth daily.   cholecalciferol  1000 units tablet Commonly known as: VITAMIN D  Take 2,000 Units by mouth daily.   dexlansoprazole 60 MG capsule Commonly known as: DEXILANT Take 60 mg by mouth daily.   DULoxetine  60 MG capsule Commonly known as: CYMBALTA  Take 1 capsule (60 mg total) by mouth daily. What changed: Another medication with the same name was removed. Continue taking this medication, and follow the directions you see here.   fexofenadine 180 MG tablet Commonly known as: ALLEGRA Take 180 mg by mouth daily.   fluticasone  50 MCG/ACT nasal spray Commonly known as: FLONASE  Place 1 spray into both nostrils daily.   folic acid 1 MG tablet Commonly known as: FOLVITE Take 1 mg by mouth daily.   gabapentin  300 MG capsule Commonly known as: NEURONTIN  Take 1 capsule (300 mg total) by mouth in the morning and at bedtime. What changed: Another medication with the  same name was removed. Continue taking this medication, and follow the directions you see here.   guaiFENesin 600 MG 12 hr tablet Commonly known as: MUCINEX Take 600 mg by mouth daily as needed for cough.   hydrOXYzine  25 MG tablet Commonly known as: ATARAX  Take 25 mg by mouth at bedtime.  levothyroxine  125 MCG tablet Commonly known as: SYNTHROID  Take 125 mcg by mouth daily before breakfast.   meclizine  25 MG tablet Commonly known as: ANTIVERT  Take 25 mg by mouth as needed for dizziness.   methotrexate 2.5 MG tablet Commonly known as: RHEUMATREX Take 5 mg by mouth 2 (two) times a week.   naloxone  4 MG/0.1ML Liqd nasal spray kit Commonly known as: Narcan  Place 1 spray into the nose once as needed for up to 1 dose. Use if unresponsive or respirations < 8   ondansetron  4 MG disintegrating tablet Commonly known as: ZOFRAN -ODT Take 4 mg by mouth every 6 (six) hours as needed for vomiting or nausea.   oxyCODONE  5 MG immediate release tablet Commonly known as: Oxy IR/ROXICODONE  Take 1-2 tablets (5-10 mg total) by mouth every 4 (four) hours as needed for severe pain (pain score 7-10).   polyethylene glycol powder 17 GM/SCOOP powder Commonly known as: GLYCOLAX /MIRALAX  Take 17 g by mouth daily as needed. What changed: Another medication with the same name was added. Make sure you understand how and when to take each.   polyethylene glycol 17 g packet Commonly known as: MIRALAX  / GLYCOLAX  Take 17 g by mouth 2 (two) times daily. What changed: You were already taking a medication with the same name, and this prescription was added. Make sure you understand how and when to take each.   PRESERVISION AREDS PO Take 1 tablet by mouth 2 (two) times daily.   rivaroxaban 10 MG Tabs tablet Commonly known as: XARELTO Take 1 tablet (10 mg total) by mouth daily with breakfast for 21 days.   senna 8.6 MG Tabs tablet Commonly known as: SENOKOT Take 2 tablets (17.2 mg total) by mouth at  bedtime for 14 days.   tiZANidine  2 MG tablet Commonly known as: ZANAFLEX  Take 1-2 tablets (2-4 mg total) by mouth every 8 (eight) hours as needed for muscle spasms. What changed:  how much to take when to take this reasons to take this   vitamin C 1000 MG tablet Take 1,000 mg by mouth daily.               Discharge Care Instructions  (From admission, onward)           Start     Ordered   09/26/24 0000  Change dressing       Comments: Maintain surgical dressing until follow up in the clinic. If the edges start to pull up, may reinforce with tape. If the dressing is no longer working, may remove and cover with gauze and tape, but must keep the area dry and clean.  Call with any questions or concerns.   09/26/24 0758            Diagnostic Studies: No results found.  Disposition: Discharge disposition: 01-Home or Self Care       Discharge Instructions     Call MD / Call 911   Complete by: As directed    If you experience chest pain or shortness of breath, CALL 911 and be transported to the hospital emergency room.  If you develope a fever above 101 F, pus (white drainage) or increased drainage or redness at the wound, or calf pain, call your surgeon's office.   Change dressing   Complete by: As directed    Maintain surgical dressing until follow up in the clinic. If the edges start to pull up, may reinforce with tape. If the dressing is no longer working, may remove and  cover with gauze and tape, but must keep the area dry and clean.  Call with any questions or concerns.   Constipation Prevention   Complete by: As directed    Drink plenty of fluids.  Prune juice may be helpful.  You may use a stool softener, such as Colace (over the counter) 100 mg twice a day.  Use MiraLax  (over the counter) for constipation as needed.   Diet - low sodium heart healthy   Complete by: As directed    Increase activity slowly as tolerated   Complete by: As directed    Weight  bearing as tolerated with assist device (walker, cane, etc) as directed, use it as long as suggested by your surgeon or therapist, typically at least 4-6 weeks.   Post-operative opioid taper instructions:   Complete by: As directed    POST-OPERATIVE OPIOID TAPER INSTRUCTIONS: It is important to wean off of your opioid medication as soon as possible. If you do not need pain medication after your surgery it is ok to stop day one. Opioids include: Codeine, Hydrocodone (Norco, Vicodin), Oxycodone (Percocet, oxycontin ) and hydromorphone  amongst others.  Long term and even short term use of opiods can cause: Increased pain response Dependence Constipation Depression Respiratory depression And more.  Withdrawal symptoms can include Flu like symptoms Nausea, vomiting And more Techniques to manage these symptoms Hydrate well Eat regular healthy meals Stay active Use relaxation techniques(deep breathing, meditating, yoga) Do Not substitute Alcohol  to help with tapering If you have been on opioids for less than two weeks and do not have pain than it is ok to stop all together.  Plan to wean off of opioids This plan should start within one week post op of your joint replacement. Maintain the same interval or time between taking each dose and first decrease the dose.  Cut the total daily intake of opioids by one tablet each day Next start to increase the time between doses. The last dose that should be eliminated is the evening dose.      TED hose   Complete by: As directed    Use stockings (TED hose) for 2 weeks on both leg(s).  You may remove them at night for sleeping.        Contact information for follow-up providers     Ernie Cough, MD. Go on 10/11/2024.   Specialty: Orthopedic Surgery Why: You are scheduled for a post op appointment on Thursday 10/11/24 at 3:45pm Contact information: [address] [place] [address] [phone]               Contact information for after-discharge care     Destination     [facility] .   Service: Skilled Nursing Contact information: [address] [place]  [address] [phone]                      Signed: Rosina JONELLE Berg 09/26/2024, 10:22 AM

## 2024-09-26 NOTE — Progress Notes (Signed)
 Subjective: 1 Day Post-Op Procedure(s) (LRB): ARTHROPLASTY, KNEE, TOTAL (Right) Patient reports pain as mild.   Patient seen in rounds with Dr. Ernie. Patient is well, and has had no acute complaints or problems. No acute events overnight. Foley catheter removed. Patient ambulated 18 feet with PT.  We will start therapy today.   Objective: Vital signs in last 24 hours: Temp:  [97.6 F (36.4 C)-98.7 F (37.1 C)] 98.7 F (37.1 C) (11/05 0656) Pulse Rate:  [65-96] 70 (11/05 0656) Resp:  [10-21] 16 (11/05 0656) BP: (105-176)/(57-99) 121/67 (11/05 0656) SpO2:  [93 %-100 %] 93 % (11/05 0656)  Intake/Output from previous day:  Intake/Output Summary (Last 24 hours) at 09/26/2024 0840 Last data filed at 09/26/2024 0757 Gross per 24 hour  Intake 2077.83 ml  Output 3375 ml  Net -1297.17 ml     Intake/Output this shift: Total I/O In: 240 [P.O.:240] Out: 300 [Urine:300]  Labs: Recent Labs    09/26/24 0358  HGB 11.2*   Recent Labs    09/26/24 0358  WBC 10.1  RBC 3.92  HCT 36.3  PLT 261   Recent Labs    09/26/24 0358  NA 135  K 4.6  CL 103  CO2 21*  BUN 14  CREATININE 0.87  GLUCOSE 128*  CALCIUM  8.9   No results for input(s): LABPT, INR in the last 72 hours.  Exam: General - Patient is Alert and Oriented Extremity - Neurologically intact Sensation intact distally Intact pulses distally Dorsiflexion/Plantar flexion intact Dressing - dressing C/D/I Motor Function - intact, moving foot and toes well on exam.   Past Medical History:  Diagnosis Date   Allergy    seasonal and environmental   Anxiety    Arthritis    osteoarthritis   Cancer (HCC)    melanoma on RLE   Cataract    Complication of anesthesia 90's   block for elbow surgery and pt had a seizure... surgery since with no problem    Depression    Fibromyalgia    GERD (gastroesophageal reflux disease)    Glaucoma of both eyes    H/O hiatal hernia    History of pericarditis    DEC 2008 AND  SMALL PERICARDIAL EFFUSION--   RESOLVED   Hyperlipidemia    Hypertension    Hypothyroidism    IBS (irritable bowel syndrome)    Interstitial cystitis    Migraines    Osteoporosis    Pelvic pain    Seizures (HCC)    one occurence in the 1990's after anesthesia, no problems since   Spinal stenosis    Vitamin D  deficiency     Assessment/Plan: 1 Day Post-Op Procedure(s) (LRB): ARTHROPLASTY, KNEE, TOTAL (Right) Principal Problem:   S/P total knee replacement, right Active Problems:   S/P total knee arthroplasty, right  Estimated body mass index is 29.23 kg/m as calculated from the following:   Height as of this encounter: 5' 3 (1.6 m).   Weight as of this encounter: 74.8 kg. Advance diet Up with therapy D/C IV fluids   Patient's anticipated LOS is less than 2 midnights, meeting these requirements: - Younger than 6 - Lives within 1 hour of care - Has a competent adult at home to recover with post-op recover - NO history of  - Chronic pain requiring opiods  - Diabetes  - Coronary Artery Disease  - Heart failure  - Heart attack  - Stroke  - DVT/VTE  - Cardiac arrhythmia  - Respiratory Failure/COPD  - Renal  failure  - Anemia  - Advanced Liver disease     DVT Prophylaxis - Aspirin Weight bearing as tolerated.  Hgb stable at 11.2 this AM  Plan for d/c to [facility] today if meeting goals with PT RX printed   Rosina Calin, PA-C Orthopedic Surgery [phone] 09/26/2024, 8:40 AM

## 2024-09-26 NOTE — Plan of Care (Signed)
   Problem: Coping: Goal: Level of anxiety will decrease Outcome: Progressing   Problem: Pain Managment: Goal: General experience of comfort will improve and/or be controlled Outcome: Progressing   Problem: Safety: Goal: Ability to remain free from injury will improve Outcome: Progressing

## 2024-09-27 ENCOUNTER — Non-Acute Institutional Stay (SKILLED_NURSING_FACILITY): Payer: Self-pay | Admitting: Adult Health

## 2024-09-27 ENCOUNTER — Encounter: Payer: Self-pay | Admitting: Adult Health

## 2024-09-27 DIAGNOSIS — N390 Urinary tract infection, site not specified: Secondary | ICD-10-CM | POA: Diagnosis not present

## 2024-09-27 DIAGNOSIS — Z96651 Presence of right artificial knee joint: Secondary | ICD-10-CM | POA: Diagnosis not present

## 2024-09-27 DIAGNOSIS — M1711 Unilateral primary osteoarthritis, right knee: Secondary | ICD-10-CM | POA: Diagnosis not present

## 2024-09-27 DIAGNOSIS — Z471 Aftercare following joint replacement surgery: Secondary | ICD-10-CM | POA: Diagnosis not present

## 2024-09-27 DIAGNOSIS — G894 Chronic pain syndrome: Secondary | ICD-10-CM | POA: Diagnosis not present

## 2024-09-27 DIAGNOSIS — E039 Hypothyroidism, unspecified: Secondary | ICD-10-CM | POA: Diagnosis not present

## 2024-09-27 DIAGNOSIS — L4050 Arthropathic psoriasis, unspecified: Secondary | ICD-10-CM | POA: Diagnosis not present

## 2024-09-27 DIAGNOSIS — R278 Other lack of coordination: Secondary | ICD-10-CM | POA: Diagnosis not present

## 2024-09-27 DIAGNOSIS — M6281 Muscle weakness (generalized): Secondary | ICD-10-CM | POA: Diagnosis not present

## 2024-09-27 DIAGNOSIS — R2689 Other abnormalities of gait and mobility: Secondary | ICD-10-CM | POA: Diagnosis not present

## 2024-09-27 NOTE — Progress Notes (Signed)
 Location:  [facility] Service:  SNF (31) Provider:   Bari America, ANP [facility] [phone]   April Elsie JONETTA Mickey., MD  Patient Care Team: April Elsie JONETTA Mickey., MD as PCP - General (Internal Medicine)  Extended Emergency Contact Information Primary Emergency Contact: Favorite,David Dr. Address: [address]          [place], [place] [address] [place] Home Phone: [phone] Mobile Phone: [phone] Relation: Spouse Secondary Emergency Contact: Lapierre,Kim  [place] Home Phone: [phone] Relation: Daughter  Code Status:  Full Goals of care: Advanced Directive information    09/25/2024    3:27 PM  Advanced Directives  Does Patient Have a Medical Advance Directive? Yes  Type of Estate Agent of [place];Living will  Does patient want to make changes to medical advance directive? No - Patient declined  Copy of Healthcare Power of Attorney in Chart? No - copy requested     Chief Complaint  Patient presents with   Acute Visit    Hospital follow up    HPI:   The patient is a 78 year old who presents for skilled rehab following right total knee arthroplasty.  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 - Chronic pain managed with Neurontin  and oxycodone   Chronic urinary tract infections - On Keflex  for chronic urinary tract infections - No current voiding issues  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 surgery since with no problem    Depression    Fibromyalgia    GERD (gastroesophageal reflux disease)    Glaucoma of both eyes    H/O hiatal hernia    History of pericarditis    DEC 2008 AND SMALL PERICARDIAL EFFUSION--   RESOLVED   Hyperlipidemia    Hypertension    Hypothyroidism    IBS (irritable bowel syndrome)    Interstitial cystitis    Migraines    Osteoporosis    Pelvic pain    Seizures (HCC)    one occurence in the 1990's after anesthesia, no problems since   Spinal stenosis    Vitamin D  deficiency    Past Surgical History:  Procedure Laterality Date   ANTERIOR CERVICAL DECOMP/DISCECTOMY FUSION  12/08/2011   Procedure: ANTERIOR CERVICAL DECOMPRESSION/DISCECTOMY FUSION 3 LEVELS;  Surgeon: Oneil Rodgers Priestly, MD;  Location: [facility] OR;  Service: Orthopedics;  Laterality: N/A;  C 3-6 ACDF   BREAST BIOPSY Right    CARDIAC CATHETERIZATION  10-27-2007  DR WOLM NIDA   NORMAL CORONARY ARTERIES/ NORMAL LVF   CARDIOVASCULAR STRESS TEST  02-25-2010  DR  CRENSHAW   ANTERIOR ATTENUATION NO SCAR OR ISCHEMIA/ EF 81%   CATARACT EXTRACTION W/ INTRAOCULAR LENS  IMPLANT, BILATERAL     CESAREAN SECTION  1975   COLONOSCOPY     CYSTO WITH HYDRODISTENSION N/A 06/14/2013   Procedure: CYSTOSCOPY/HYDRODISTENSION/INSTILLATION OF MARCAINE  AND PYRIDIUM ;  Surgeon: Glendia DELENA Elizabeth, MD;  Location: [facility];  Service: Urology;  Laterality: N/A;   ELBOW TENDON RELEASE Left 1996   GLAUCOMA SURGERY Bilateral 2004   RHINOPLASTY  2010    SPINAL FUSION N/A 2017   L 4-5, S-1   SPINAL FUSION  11/25/2022   L2,3, L3,4, and revision of previous fusions   THUMB TENDON TRANSPOSITION Right 2012   TONSILLECTOMY  1951   TOTAL ABDOMINAL HYSTERECTOMY W/ BILATERAL SALPINGOOPHORECTOMY  1982   TOTAL KNEE ARTHROPLASTY Right 09/25/2024   Procedure: ARTHROPLASTY, KNEE, TOTAL;  Surgeon: Ernie Cough, MD;  Location: [facility] ORS;  Service: Orthopedics;  Laterality: Right;   TRANSTHORACIC ECHOCARDIOGRAM  03/04/2010   MODERATE LVH/ NORMAL LVSF/ MILD DIASTOLIC DYSFUNCTION/ EF 60%/ MILD AI   UPPER GASTROINTESTINAL ENDOSCOPY      Allergies  Allergen Reactions   Aspirin Other (See Comments)    Chest pain    Avelox [Moxifloxacin] Hives   Moxifloxacin Hcl In Nacl Diarrhea and Hives   Nitrofurantoin Diarrhea    Other Reaction(s): GI Intolerance   Nsaids Other (See Comments)    Chest pain    Sulfa Antibiotics Hives and Other (See Comments)    Chest pain    Chlorthalidone Other (See Comments)    Unknown    Hydrochlorothiazide     unknown    Outpatient Encounter Medications as of 09/27/2024  Medication Sig   acetaminophen  (TYLENOL ) 500 MG tablet Take 2 tablets (1,000 mg total) by mouth every 6 (six) hours.   acidophilus (RISAQUAD) CAPS capsule Take 1 capsule by mouth daily.   amLODipine  (NORVASC ) 5 MG tablet TAKE ONE TABLET BY MOUTH ONCE DAILY (Patient taking differently: Take 5 mg by mouth at bedtime.)   Ascorbic Acid (VITAMIN C) 1000 MG tablet Take 1,000 mg by mouth daily.   brimonidine (ALPHAGAN) 0.2 % ophthalmic solution Place 1 drop into both eyes in the morning and at bedtime.   cephALEXin  (KEFLEX ) 250 MG capsule Take 250 mg by mouth daily.   cholecalciferol  (VITAMIN D ) 1000 units tablet Take 2,000 Units by mouth daily.   dexlansoprazole (DEXILANT) 60 MG capsule Take 60 mg by mouth daily.   DULoxetine  (CYMBALTA ) 60 MG capsule Take 1 capsule (60 mg total) by mouth daily.   fexofenadine (ALLEGRA) 180 MG tablet Take 180 mg by mouth daily.    fluticasone  (FLONASE ) 50 MCG/ACT nasal spray Place 1 spray into both nostrils daily.   folic acid (FOLVITE) 1 MG tablet Take 1 mg by mouth daily.   gabapentin  (NEURONTIN ) 300 MG capsule Take 1 capsule (300 mg total) by mouth in the morning and at bedtime.   guaiFENesin (MUCINEX) 600 MG 12 hr tablet Take 600 mg by mouth daily as needed for cough.   hydrOXYzine  (ATARAX ) 25 MG tablet Take 25 mg by mouth at bedtime.   levothyroxine  (SYNTHROID , LEVOTHROID) 125 MCG tablet Take 125 mcg by mouth daily before breakfast.   meclizine  (ANTIVERT ) 25 MG tablet Take 25 mg by mouth as needed for dizziness.   methotrexate (RHEUMATREX) 2.5 MG tablet Take 5 mg by mouth 2 (two) times a week.   Multiple Vitamins-Minerals (PRESERVISION AREDS PO) Take 1 tablet by mouth 2 (two) times daily.  naloxone  (NARCAN ) nasal spray 4 mg/0.1 mL Place 1 spray into the nose once as needed for up to 1 dose. Use if unresponsive or respirations < 8   ondansetron  (ZOFRAN -ODT) 4 MG disintegrating tablet Take 4 mg by mouth every 6 (six) hours as needed for vomiting or nausea.   oxyCODONE  (OXY IR/ROXICODONE ) 5 MG immediate release tablet Take 1-2 tablets (5-10 mg total) by mouth every 4 (four) hours as needed for severe pain (pain score 7-10).   polyethylene glycol (MIRALAX  / GLYCOLAX ) 17 g packet Take 17 g by mouth 2 (two) times daily.   polyethylene glycol powder (GLYCOLAX /MIRALAX ) 17 GM/SCOOP powder Take 17 g by mouth daily as needed. (Patient not taking: Reported on 09/12/2024)   rivaroxaban (XARELTO) 10 MG TABS tablet Take 1 tablet (10 mg total) by mouth daily with breakfast for 21 days.   senna (SENOKOT) 8.6 MG TABS tablet Take 2 tablets (17.2 mg total) by mouth at bedtime for 14 days.   tiZANidine  (ZANAFLEX ) 2 MG tablet Take 1-2 tablets (2-4 mg total) by mouth every 8 (eight) hours as needed for muscle spasms.   [DISCONTINUED] celecoxib (CELEBREX) 100 MG capsule Take 100 mg by mouth daily as needed (headache).    Facility-Administered Encounter Medications as of 09/27/2024  Medication   0.9 %  sodium chloride  infusion    Review of Systems  Constitutional:  Negative for activity change, appetite change, chills, diaphoresis, fatigue and fever.  HENT:  Negative for congestion.   Respiratory:  Negative for cough, shortness of breath and wheezing.   Cardiovascular:  Positive for leg swelling. Negative for chest pain.  Gastrointestinal:  Positive for constipation. Negative for abdominal distention, abdominal pain, diarrhea, nausea and vomiting.  Genitourinary:  Negative for difficulty urinating, dysuria and urgency.  Musculoskeletal:  Positive for arthralgias and gait problem. Negative for back pain, myalgias and neck pain.  Skin:  Negative for rash.  Neurological:  Negative for dizziness and weakness.  Psychiatric/Behavioral:  Negative for confusion.     Immunization History  Administered Date(s) Administered   Fluzone Influenza virus vaccine,trivalent (IIV3), split virus 08/13/2010, 08/15/2012   INFLUENZA, HIGH DOSE SEASONAL PF 08/12/2017, 08/28/2022   Influenza Split 12/30/2009, 09/09/2011, 08/21/2013, 08/20/2014   Influenza, Quadrivalent, Recombinant, Inj, Pf 07/29/2018, 08/23/2019   Influenza,inj,Quad PF,6+ Mos 08/20/2014, 07/18/2015   Influenza,inj,quad, With Preservative 08/22/2018   Influenza-Unspecified 09/23/2020, 08/28/2022   Moderna Sars-Covid-2 Vaccination 12/04/2019, 01/01/2020, 08/07/2020, 08/18/2021   Pfizer(Comirnaty )Fall Seasonal Vaccine 12 years and older 09/15/2023   Pneumococcal Conjugate-13 04/10/2014   Pneumococcal Polysaccharide-23 12/30/2009, 01/05/2011, 04/04/2013   Td (Adult),5 Lf Tetanus Toxid, Preservative Free 12/30/2009   Tdap 02/23/2022   Zoster Recombinant(Shingrix ) 11/25/2023, 02/15/2024   Zoster, Live 01/05/2011   Pertinent  Health Maintenance Due  Topic Date Due   DEXA SCAN  Never done   Colonoscopy  11/30/2028   Influenza Vaccine  Completed    Mammogram  Discontinued      10/17/2018    1:27 PM 08/26/2019    5:19 AM 05/21/2021    6:54 AM 10/16/2022    6:49 PM 12/01/2022   11:53 AM  Fall Risk  Falls in the past year?     0  Was there an injury with Fall?     0  Fall Risk Category Calculator     0  Fall Risk Category (Retired)     Low   (RETIRED) Patient Fall Risk Level Low fall risk  Low fall risk  Low fall risk  Low fall risk  Low fall  risk   Patient at Risk for Falls Due to     No Fall Risks  Fall risk Follow up     Falls evaluation completed      Data saved with a previous flowsheet row definition   Functional Status Survey:    Vitals:   09/27/24 1721  BP: (!) 159/75  Pulse: 85  Resp: 16  Temp: 98.4 F (36.9 C)  SpO2: 93%  Weight: 176 lb (79.8 kg)   Body mass index is 31.18 kg/m. Physical Exam Vitals and nursing note reviewed.  Constitutional:      Appearance: Normal appearance.  Cardiovascular:     Rate and Rhythm: Normal rate and regular rhythm.     Heart sounds: No murmur heard. Pulmonary:     Effort: Pulmonary effort is normal.     Breath sounds: Normal breath sounds.  Musculoskeletal:     Right lower leg: Edema present.     Left lower leg: No edema.     Comments: No calf tenderness or warmth  Right lower ext +1 edema  Neurological:     Mental Status: She is alert and oriented to person, place, and time. Mental status is at baseline.     Labs reviewed: Recent Labs    09/13/24 1044 09/26/24 0358  NA 140 135  K 4.0 4.6  CL 103 103  CO2 27 21*  GLUCOSE 97 128*  BUN 13 14  CREATININE 0.90 0.87  CALCIUM  8.9 8.9   Recent Labs    09/13/24 1044  AST 16  ALT 12  ALKPHOS 109  BILITOT 0.4  PROT 6.9  ALBUMIN 4.3   Recent Labs    09/13/24 1044 09/26/24 0358  WBC 8.6 10.1  HGB 12.4 11.2*  HCT 39.7 36.3  MCV 92.3 92.6  PLT 324 261    No results found for: HGBA1C No results found for: CHOL, HDL, LDLCALC, LDLDIRECT, TRIG, CHOLHDL  Significant Diagnostic Results in  last 30 days:  No results found.  Assessment/Plan  Status post right total knee arthroplasty with postoperative right knee pain Postoperative right knee pain rated 3/10, well-controlled with current regimen. - Continue gabapentin , oxycodone , and Zanaflex  for pain management. - Continue use of ice for pain relief. - Continue Xarelto for 21 days postoperatively. - Follow up with orthopedics as indicated.  Constipation Experiencing constipation, plan to add Senokot for additional relief. - Continue Miralax  twice daily. - Added Senokot for constipation management.  Polyarticular psoriatic arthritis Managed by [facility] with methotrexate. - Continue methotrexate as prescribed by rheumatology.  Chronic pain due to lumbar spondylosis and radiculopathy Chronic pain management under the care of Dr. Orlando. - Continue Neurontin  and oxycodone  for chronic pain management.  Hypertension Well controlled on Norvasc . - Continue Norvasc  for hypertension management.  Chronic urinary tract infection Managed by urology with Keflex . - Continue Keflex  as prescribed by urology.

## 2024-09-28 ENCOUNTER — Other Ambulatory Visit: Payer: Self-pay | Admitting: Adult Health

## 2024-09-28 DIAGNOSIS — M1711 Unilateral primary osteoarthritis, right knee: Secondary | ICD-10-CM | POA: Diagnosis not present

## 2024-09-28 DIAGNOSIS — R2689 Other abnormalities of gait and mobility: Secondary | ICD-10-CM | POA: Diagnosis not present

## 2024-09-28 DIAGNOSIS — Z471 Aftercare following joint replacement surgery: Secondary | ICD-10-CM | POA: Diagnosis not present

## 2024-09-28 DIAGNOSIS — R278 Other lack of coordination: Secondary | ICD-10-CM | POA: Diagnosis not present

## 2024-09-28 DIAGNOSIS — Z96651 Presence of right artificial knee joint: Secondary | ICD-10-CM | POA: Diagnosis not present

## 2024-09-28 DIAGNOSIS — M6281 Muscle weakness (generalized): Secondary | ICD-10-CM | POA: Diagnosis not present

## 2024-09-28 MED ORDER — OXYCODONE HCL 5 MG PO TABS: 5.0000 mg | ORAL_TABLET | ORAL | 0 refills | Status: DC | PRN

## 2024-09-28 MED ORDER — OXYCODONE HCL 5 MG PO TABS: 10.0000 mg | ORAL_TABLET | ORAL | 0 refills | Status: DC

## 2024-09-28 NOTE — Progress Notes (Signed)
 Pt husband is requesting her oxycodone  be scheduled for 3 days as she has chronic pain issues and now has an acute issue with her recent knee surgery. She is tolerating the oxycodone  well.

## 2024-10-01 ENCOUNTER — Other Ambulatory Visit: Payer: Self-pay | Admitting: Adult Health

## 2024-10-01 ENCOUNTER — Telehealth: Payer: Self-pay | Admitting: Family

## 2024-10-01 DIAGNOSIS — R278 Other lack of coordination: Secondary | ICD-10-CM | POA: Diagnosis not present

## 2024-10-01 DIAGNOSIS — M1711 Unilateral primary osteoarthritis, right knee: Secondary | ICD-10-CM | POA: Diagnosis not present

## 2024-10-01 DIAGNOSIS — Z471 Aftercare following joint replacement surgery: Secondary | ICD-10-CM | POA: Diagnosis not present

## 2024-10-01 DIAGNOSIS — M6281 Muscle weakness (generalized): Secondary | ICD-10-CM | POA: Diagnosis not present

## 2024-10-01 DIAGNOSIS — Z96651 Presence of right artificial knee joint: Secondary | ICD-10-CM | POA: Diagnosis not present

## 2024-10-01 DIAGNOSIS — R2689 Other abnormalities of gait and mobility: Secondary | ICD-10-CM | POA: Diagnosis not present

## 2024-10-01 MED ORDER — OXYCODONE HCL 5 MG PO TABS: 10.0000 mg | ORAL_TABLET | ORAL | 0 refills | Status: DC

## 2024-10-01 NOTE — Telephone Encounter (Signed)
 [facility] facility nurse called on-call provider to report patient's complaining of knee pain rating 10 out of 10 on a scale patient's husband request additional oxycodone  5 mg to be administered.  Patient currently on oxycodone  10 mg every 4 hours as needed for pain.  Status post total knee replacement.  Orders given for oxycodone  5 mg x 1 dose.  Please follow-up reevaluate pain.

## 2024-10-02 ENCOUNTER — Encounter: Payer: Self-pay | Admitting: Orthopedic Surgery

## 2024-10-02 ENCOUNTER — Non-Acute Institutional Stay (SKILLED_NURSING_FACILITY): Payer: Self-pay | Admitting: Orthopedic Surgery

## 2024-10-02 ENCOUNTER — Other Ambulatory Visit: Payer: Self-pay | Admitting: Orthopedic Surgery

## 2024-10-02 DIAGNOSIS — M6281 Muscle weakness (generalized): Secondary | ICD-10-CM | POA: Diagnosis not present

## 2024-10-02 DIAGNOSIS — E039 Hypothyroidism, unspecified: Secondary | ICD-10-CM | POA: Diagnosis not present

## 2024-10-02 DIAGNOSIS — Z471 Aftercare following joint replacement surgery: Secondary | ICD-10-CM | POA: Diagnosis not present

## 2024-10-02 DIAGNOSIS — Z96651 Presence of right artificial knee joint: Secondary | ICD-10-CM

## 2024-10-02 DIAGNOSIS — R278 Other lack of coordination: Secondary | ICD-10-CM | POA: Diagnosis not present

## 2024-10-02 DIAGNOSIS — G894 Chronic pain syndrome: Secondary | ICD-10-CM

## 2024-10-02 DIAGNOSIS — M1711 Unilateral primary osteoarthritis, right knee: Secondary | ICD-10-CM | POA: Diagnosis not present

## 2024-10-02 DIAGNOSIS — R2689 Other abnormalities of gait and mobility: Secondary | ICD-10-CM | POA: Diagnosis not present

## 2024-10-02 MED ORDER — OXYCODONE HCL 5 MG PO TABS: 10.0000 mg | ORAL_TABLET | ORAL | 0 refills | Status: AC

## 2024-10-02 MED ORDER — TIZANIDINE HCL 2 MG PO TABS: 2.0000 mg | ORAL_TABLET | Freq: Three times a day (TID) | ORAL | 0 refills | Status: AC | PRN

## 2024-10-02 NOTE — Progress Notes (Unsigned)
 Location:  [facility] Room Number: 157/A Place of Service:  SNF [phone]) Provider:  Greig FORBES Cluster, NP   Loreli Elsie JONETTA Mickey., MD  Patient Care Team: Loreli Elsie JONETTA Mickey., MD as PCP - General (Internal Medicine)  Extended Emergency Contact Information Primary Emergency Contact: Koslowski,David Dr. Address: [address], [address]          [place], [place] [address] [place] Home Phone: [phone] Mobile Phone: [phone] Relation: Spouse Secondary Emergency Contact: Lapierre,Kim  [place] Home Phone: [phone] Relation: Daughter  Code Status:  Full code  Goals of care: Advanced Directive information    09/25/2024    3:27 PM  Advanced Directives  Does Patient Have a Medical Advance Directive? Yes  Type of Estate Agent of [place];Living will  Does patient want to make changes to medical advance directive? No - Patient declined  Copy of Healthcare Power of Attorney in Chart? No - copy requested     Chief Complaint  Patient presents with   Discharge Note    HPI:  Pt is a 78 y.o. female seen today for discharge evaluation.   She currently resides on the rehab unit at [facility]. PMH: GERD, IBS, hypothyroidism, chronic pain, cervical radiculopathy, vertigo, balance issues, depression and macular degeneration.   09/25/2024, s/p right total knee arthroplasty by Dr. Ernie. She is taking oxycodone , tylenol , gabapentin  and xanaflex for pain. Also has polar ice machine. Pain has varied based on activity. She is working with PT/OT. WBAT with walker. No recent falls. Xarelto for DVT prophylaxis. Denies chest pain, shortness of breath, calf pain. Aquacell intact. She has showered and had a few bowel movements. She is requesting extra oxycodone  for  emergency pain, which I have declined at this time. Discussed increased risks of narcotic use including falls risk, confusion, sedation and drowsiness. She  is also taking Cymbalta  and hydroxyzine . Husband plans to help with care at home. PT/OT plan to follow her at home. Afebrile. Vitals stable.    Past Medical History:  Diagnosis Date   Allergy    seasonal and environmental   Anxiety    Arthritis    osteoarthritis   Cancer (HCC)    melanoma on RLE   Cataract    Complication of anesthesia 90's   block for elbow surgery and pt had a seizure... surgery since with no problem    Depression    Fibromyalgia    GERD (gastroesophageal reflux disease)    Glaucoma of both eyes    H/O hiatal hernia    History of pericarditis    DEC 2008 AND SMALL PERICARDIAL EFFUSION--   RESOLVED   Hyperlipidemia    Hypertension    Hypothyroidism    IBS (irritable bowel syndrome)    Interstitial cystitis    Migraines    Osteoporosis    Pelvic pain    Seizures (HCC)    one occurence in the 1990's after anesthesia, no problems since   Spinal stenosis    Vitamin D  deficiency    Past Surgical History:  Procedure Laterality Date   ANTERIOR CERVICAL DECOMP/DISCECTOMY FUSION  12/08/2011   Procedure: ANTERIOR CERVICAL DECOMPRESSION/DISCECTOMY FUSION 3 LEVELS;  Surgeon: Oneil Rodgers Priestly, MD;  Location: [facility];  Service: Orthopedics;  Laterality: N/A;  C 3-6 ACDF   BREAST BIOPSY Right    CARDIAC CATHETERIZATION  10-27-2007  DR WOLM NIDA   NORMAL CORONARY ARTERIES/ NORMAL LVF   CARDIOVASCULAR STRESS TEST  02-25-2010  DR PIETRO   ANTERIOR ATTENUATION NO SCAR OR ISCHEMIA/ EF 81%   CATARACT EXTRACTION W/ INTRAOCULAR LENS  IMPLANT, BILATERAL     CESAREAN SECTION  1975   COLONOSCOPY     CYSTO WITH HYDRODISTENSION N/A 06/14/2013   Procedure: CYSTOSCOPY/HYDRODISTENSION/INSTILLATION OF MARCAINE  AND PYRIDIUM ;  Surgeon: Glendia DELENA Elizabeth, MD;  Location: [facility];  Service: Urology;  Laterality: N/A;   ELBOW TENDON RELEASE Left 1996   GLAUCOMA SURGERY Bilateral 2004   RHINOPLASTY  2010   SPINAL FUSION N/A 2017   L 4-5, S-1   SPINAL  FUSION  11/25/2022   L2,3, L3,4, and revision of previous fusions   THUMB TENDON TRANSPOSITION Right 2012   TONSILLECTOMY  1951   TOTAL ABDOMINAL HYSTERECTOMY W/ BILATERAL SALPINGOOPHORECTOMY  1982   TOTAL KNEE ARTHROPLASTY Right 09/25/2024   Procedure: ARTHROPLASTY, KNEE, TOTAL;  Surgeon: Ernie Cough, MD;  Location: [facility];  Service: Orthopedics;  Laterality: Right;   TRANSTHORACIC ECHOCARDIOGRAM  03/04/2010   MODERATE LVH/ NORMAL LVSF/ MILD DIASTOLIC DYSFUNCTION/ EF 60%/ MILD AI   UPPER GASTROINTESTINAL ENDOSCOPY      Allergies  Allergen Reactions   Aspirin Other (See Comments)    Chest pain    Avelox [Moxifloxacin] Hives   Moxifloxacin Hcl In Nacl Diarrhea and Hives   Nitrofurantoin Diarrhea    Other Reaction(s): GI Intolerance   Nsaids Other (See Comments)    Chest pain    Sulfa Antibiotics Hives and Other (See Comments)    Chest pain    Chlorthalidone Other (See Comments)    Unknown    Hydrochlorothiazide     unknown    Outpatient Encounter Medications as of 10/02/2024  Medication Sig   acetaminophen  (TYLENOL ) 500 MG tablet Take 2 tablets (1,000 mg total) by mouth every 6 (six) hours.   amLODipine  (NORVASC ) 5 MG tablet TAKE ONE TABLET BY MOUTH ONCE DAILY   Ascorbic Acid (VITAMIN C) 1000 MG tablet Take 1,000 mg by mouth daily.   brimonidine (ALPHAGAN) 0.2 % ophthalmic solution Place 1 drop into both eyes in the morning and at bedtime.   cephALEXin  (KEFLEX ) 250 MG capsule Take 250 mg by mouth daily.   dexlansoprazole (DEXILANT) 60 MG capsule Take 60 mg by mouth daily.   DULoxetine  (CYMBALTA ) 60 MG capsule Take 1 capsule (60 mg total) by mouth daily.   fexofenadine (ALLEGRA) 180 MG tablet Take 180 mg by mouth daily.   fluticasone  (FLONASE ) 50 MCG/ACT nasal spray Place 1 spray into both nostrils daily.   folic acid (FOLVITE) 1 MG tablet Take 1 mg by mouth daily.   gabapentin  (NEURONTIN ) 300 MG capsule Take 1 capsule (300 mg total) by mouth in the morning and at bedtime.    guaiFENesin (MUCINEX) 600 MG 12 hr tablet Take 600 mg by mouth daily as needed for cough.   hydrOXYzine  (ATARAX ) 25 MG tablet Take 25 mg by mouth at bedtime.   levothyroxine  (SYNTHROID , LEVOTHROID) 125 MCG tablet Take 125 mcg by mouth daily before breakfast.   meclizine  (ANTIVERT ) 25 MG tablet Take 25 mg by mouth as needed for dizziness.   methotrexate (RHEUMATREX) 2.5 MG tablet Take 5 mg by mouth 2 (two) times a week.   naloxone  (NARCAN ) nasal spray 4 mg/0.1 mL Place 1 spray into the nose once as needed for up to 1 dose. Use if unresponsive or respirations < 8   ondansetron  (ZOFRAN -ODT) 4 MG disintegrating tablet Take 4 mg by mouth every 6 (six) hours as needed for vomiting or nausea.  oxyCODONE  (ROXICODONE ) 5 MG immediate release tablet Take 2 tablets (10 mg total) by mouth every 4 (four) hours for 3 days.   polyethylene glycol (MIRALAX  / GLYCOLAX ) 17 g packet Take 17 g by mouth 2 (two) times daily.   polyethylene glycol powder (GLYCOLAX /MIRALAX ) 17 GM/SCOOP powder Take 17 g by mouth daily as needed.   senna (SENOKOT) 8.6 MG TABS tablet Take 2 tablets (17.2 mg total) by mouth at bedtime for 14 days.   tiZANidine  (ZANAFLEX ) 2 MG tablet Take 1 tablet (2 mg total) by mouth every 8 (eight) hours as needed for muscle spasms.   acidophilus (RISAQUAD) CAPS capsule Take 1 capsule by mouth daily.   cholecalciferol  (VITAMIN D ) 1000 units tablet Take 2,000 Units by mouth daily.   Multiple Vitamins-Minerals (PRESERVISION AREDS PO) Take 1 tablet by mouth 2 (two) times daily.   rivaroxaban (XARELTO) 10 MG TABS tablet Take 1 tablet (10 mg total) by mouth daily with breakfast for 21 days.   Facility-Administered Encounter Medications as of 10/02/2024  Medication   0.9 %  sodium chloride  infusion    Review of Systems  Constitutional:  Negative for fever.  HENT: Negative.    Respiratory:  Negative for shortness of breath.   Cardiovascular:  Negative for chest pain and leg swelling.  Gastrointestinal:   Negative for abdominal distention, abdominal pain, constipation, nausea and vomiting.  Genitourinary: Negative.   Musculoskeletal:  Positive for arthralgias and gait problem.  Skin:  Positive for wound.  Neurological:  Positive for weakness. Negative for dizziness and headaches.  Psychiatric/Behavioral:  Negative for dysphoric mood. The patient is not nervous/anxious.     Immunization History  Administered Date(s) Administered   Fluzone Influenza virus vaccine,trivalent (IIV3), split virus 08/13/2010, 08/15/2012   INFLUENZA, HIGH DOSE SEASONAL PF 08/12/2017, 08/28/2022   Influenza Split 12/30/2009, 09/09/2011, 08/21/2013, 08/20/2014   Influenza, Quadrivalent, Recombinant, Inj, Pf 07/29/2018, 08/23/2019   Influenza,inj,Quad PF,6+ Mos 08/20/2014, 07/18/2015   Influenza,inj,quad, With Preservative 08/22/2018   Influenza-Unspecified 09/23/2020, 08/28/2022   Moderna Sars-Covid-2 Vaccination 12/04/2019, 01/01/2020, 08/07/2020, 08/18/2021   Pfizer(Comirnaty )Fall Seasonal Vaccine 12 years and older 09/15/2023   Pneumococcal Conjugate-13 04/10/2014   Pneumococcal Polysaccharide-23 12/30/2009, 01/05/2011, 04/04/2013   Td (Adult),5 Lf Tetanus Toxid, Preservative Free 12/30/2009   Tdap 02/23/2022   Zoster Recombinant(Shingrix ) 11/25/2023, 02/15/2024   Zoster, Live 01/05/2011   Pertinent  Health Maintenance Due  Topic Date Due   DEXA SCAN  Never done   Colonoscopy  11/30/2028   Influenza Vaccine  Completed   Mammogram  Discontinued      10/17/2018    1:27 PM 08/26/2019    5:19 AM 05/21/2021    6:54 AM 10/16/2022    6:49 PM 12/01/2022   11:53 AM  Fall Risk  Falls in the past year?     0  Was there an injury with Fall?     0  Fall Risk Category Calculator     0  Fall Risk Category (Retired)     Low   (RETIRED) Patient Fall Risk Level Low fall risk  Low fall risk  Low fall risk  Low fall risk  Low fall risk   Patient at Risk for Falls Due to     No Fall Risks  Fall risk Follow up      Falls evaluation completed      Data saved with a previous flowsheet row definition   Functional Status Survey:    Vitals:   10/02/24 1451  BP: 130/75  Pulse: 76  Resp: 16  Temp: 98.2 F (36.8 C)  SpO2: 92%  Weight: 170 lb 3.2 oz (77.2 kg)  Height: 5' 3 (1.6 m)   Body mass index is 30.15 kg/m. Physical Exam Vitals reviewed.  Constitutional:      General: She is not in acute distress.    Appearance: She is not ill-appearing.  HENT:     Head: Normocephalic.  Eyes:     General:        Right eye: No discharge.        Left eye: No discharge.  Cardiovascular:     Rate and Rhythm: Normal rate and regular rhythm.     Pulses: Normal pulses.     Heart sounds: Normal heart sounds.  Pulmonary:     Effort: Pulmonary effort is normal.     Breath sounds: Normal breath sounds.  Abdominal:     General: Bowel sounds are normal.     Palpations: Abdomen is soft.  Musculoskeletal:     Cervical back: Neck supple.     Right lower leg: Edema present.     Left lower leg: No edema.     Comments: RLE non pitting edema  Skin:    General: Skin is warm.     Comments: Mild swelling to right knee, surgical dressing CDI, no sign of infection  Neurological:     General: No focal deficit present.     Mental Status: She is alert and oriented to person, place, and time.     Gait: Gait abnormal.  Psychiatric:        Mood and Affect: Mood normal.     Labs reviewed: Recent Labs    09/13/24 1044 09/26/24 0358  NA 140 135  K 4.0 4.6  CL 103 103  CO2 27 21*  GLUCOSE 97 128*  BUN 13 14  CREATININE 0.90 0.87  CALCIUM  8.9 8.9   Recent Labs    09/13/24 1044  AST 16  ALT 12  ALKPHOS 109  BILITOT 0.4  PROT 6.9  ALBUMIN 4.3   Recent Labs    09/13/24 1044 09/26/24 0358  WBC 8.6 10.1  HGB 12.4 11.2*  HCT 39.7 36.3  MCV 92.3 92.6  PLT 324 261   Lab Results  Component Value Date   TSH 0.903 ***Test methodology is 3rd generation TSH*** 10/10/2007   No results found for:  HGBA1C No results found for: CHOL, HDL, LDLCALC, LDLDIRECT, TRIG, CHOLHDL  Significant Diagnostic Results in last 30 days:  No results found.  Assessment/Plan 1. S/P total knee arthroplasty, right (Primary) - RTKA 11/04 > Dr. Ernie - intermittent pain> cont tylenol , oxycodone  and xanaflex> do not recommend extra oxycodone  due to risk of falls, sedation and downiness - also taking gabapentin , cymbalta  and hydroxyzine   - cont polar ice machine  - WBAT with walker - cont Xarelto for DVT prophylaxis  - cont HH PT/OT  2. Chronic pain syndrome - see above  3. Acquired hypothyroidism - cont levothyroxine    [facility] to schedule 2 week follow up with Dr. Loreli.    Family/ staff Communication: plan discussed with patient and nurse  Labs/tests ordered:  none

## 2024-10-02 NOTE — Progress Notes (Deleted)
 Location:  [facility] Room Number: 157/A Place of Service:  SNF [phone])  Provider: Greig Cluster, NP   PCP: Loreli Elsie JONETTA Mickey., MD Patient Care Team: Loreli Elsie JONETTA Mickey., MD as PCP - General (Internal Medicine)  Extended Emergency Contact Information Primary Emergency Contact: Comes,David Dr. Address: [address]          [place], [place] [address] [place] Home Phone: [phone] Mobile Phone: [phone] Relation: Spouse Secondary Emergency Contact: Lapierre,Kim  [place] Home Phone: [phone] Relation: Daughter  Code Status: Full Code Goals of care:  Advanced Directive information    09/25/2024    3:27 PM  Advanced Directives  Does Patient Have a Medical Advance Directive? Yes  Type of Estate Agent of [place];Living will  Does patient want to make changes to medical advance directive? No - Patient declined  Copy of Healthcare Power of Attorney in Chart? No - copy requested     Allergies  Allergen Reactions   Aspirin Other (See Comments)    Chest pain    Avelox [Moxifloxacin] Hives   Moxifloxacin Hcl In Nacl Diarrhea and Hives   Nitrofurantoin Diarrhea    Other Reaction(s): GI Intolerance   Nsaids Other (See Comments)    Chest pain    Sulfa Antibiotics Hives and Other (See Comments)    Chest pain    Chlorthalidone Other (See Comments)    Unknown    Hydrochlorothiazide     unknown    Chief Complaint  Patient presents with   Discharge Note   Error    HPI:  78 y.o. female      Past Medical History:  Diagnosis Date   Allergy    seasonal and environmental   Anxiety    Arthritis    osteoarthritis   Cancer (HCC)    melanoma on RLE   Cataract    Complication of anesthesia 90's   block for elbow surgery and pt had a seizure... surgery since with no problem    Depression    Fibromyalgia    GERD (gastroesophageal reflux disease)    Glaucoma  of both eyes    H/O hiatal hernia    History of pericarditis    DEC 2008 AND SMALL PERICARDIAL EFFUSION--   RESOLVED   Hyperlipidemia    Hypertension    Hypothyroidism    IBS (irritable bowel syndrome)    Interstitial cystitis    Migraines    Osteoporosis    Pelvic pain    Seizures (HCC)    one occurence in the 1990's after anesthesia, no problems since   Spinal stenosis    Vitamin D  deficiency     Past Surgical History:  Procedure Laterality Date   ANTERIOR CERVICAL DECOMP/DISCECTOMY FUSION  12/08/2011   Procedure: ANTERIOR CERVICAL DECOMPRESSION/DISCECTOMY FUSION 3 LEVELS;  Surgeon: Oneil Rodgers Priestly, MD;  Location: [facility];  Service: Orthopedics;  Laterality: N/A;  C 3-6 ACDF   BREAST BIOPSY Right    CARDIAC CATHETERIZATION  10-27-2007  DR WOLM NIDA   NORMAL CORONARY ARTERIES/ NORMAL LVF   CARDIOVASCULAR STRESS TEST  02-25-2010  DR CRENSHAW   ANTERIOR ATTENUATION NO SCAR OR ISCHEMIA/ EF 81%   CATARACT EXTRACTION W/ INTRAOCULAR LENS  IMPLANT, BILATERAL     CESAREAN SECTION  1975   COLONOSCOPY     CYSTO WITH HYDRODISTENSION N/A 06/14/2013   Procedure: CYSTOSCOPY/HYDRODISTENSION/INSTILLATION OF MARCAINE  AND PYRIDIUM ;  Surgeon: Glendia DELENA Elizabeth, MD;  Location: [facility];  Service: Urology;  Laterality: N/A;   ELBOW TENDON RELEASE Left 1996   GLAUCOMA SURGERY Bilateral 2004   RHINOPLASTY  2010   SPINAL FUSION N/A 2017   L 4-5, S-1   SPINAL FUSION  11/25/2022   L2,3, L3,4, and revision of previous fusions   THUMB TENDON TRANSPOSITION Right 2012   TONSILLECTOMY  1951   TOTAL ABDOMINAL HYSTERECTOMY W/ BILATERAL SALPINGOOPHORECTOMY  1982   TOTAL KNEE ARTHROPLASTY Right 09/25/2024   Procedure: ARTHROPLASTY, KNEE, TOTAL;  Surgeon: Ernie Cough, MD;  Location: [facility];  Service: Orthopedics;  Laterality: Right;   TRANSTHORACIC ECHOCARDIOGRAM  03/04/2010   MODERATE LVH/ NORMAL LVSF/ MILD DIASTOLIC DYSFUNCTION/ EF 60%/ MILD AI   UPPER GASTROINTESTINAL  ENDOSCOPY        reports that she has never smoked. She has never used smokeless tobacco. She reports current alcohol  use of about 5.0 standard drinks of alcohol  per week. She reports that she does not use drugs. Social History   Socioeconomic History   Marital status: Married    Spouse name: Not on file   Number of children: 1   Years of education: Not on file   Highest education level: Not on file  Occupational History   Occupation: Retired  Tobacco Use   Smoking status: Never   Smokeless tobacco: Never  Vaping Use   Vaping status: Never Used  Substance and Sexual Activity   Alcohol  use: Yes    Alcohol /week: 5.0 standard drinks of alcohol     Types: 5 Glasses of wine per week   Drug use: Never   Sexual activity: Not Currently    Birth control/protection: Post-menopausal, Surgical  Other Topics Concern   Not on file  Social History Narrative   Husband is Dr. Alm Nice   Social Drivers of Health   Financial Resource Strain: Low Risk  (12/02/2022)   Received from [facility]   Overall Financial Resource Strain (CARDIA)    Difficulty of Paying Living Expenses: Not hard at all  Food Insecurity: No Food Insecurity (09/25/2024)   Hunger Vital Sign    Worried About Running Out of Food in the Last Year: Never true    Ran Out of Food in the Last Year: Never true  Transportation Needs: No Transportation Needs (09/25/2024)   PRAPARE - Administrator, Civil Service (Medical): No    Lack of Transportation (Non-Medical): No  Physical Activity: Not on file  Stress: Not on file  Social Connections: Unknown (09/25/2024)   Social Connection and Isolation Panel    Frequency of Communication with Friends and Family: More than three times a week    Frequency of Social Gatherings with Friends and Family: More than three times a week    Attends Religious Services: More than 4 times per year    Active Member of [employer] or Organizations: Patient declined    Attends  Banker Meetings: Patient declined    Marital Status: Patient declined  Intimate Partner Violence: Not At Risk (09/25/2024)   Humiliation, Afraid, Rape, and Kick questionnaire    Fear of Current or Ex-Partner: No    Emotionally Abused: No    Physically Abused: No    Sexually Abused: No   Functional Status Survey:    Allergies  Allergen Reactions   Aspirin Other (See Comments)    Chest pain    Avelox [Moxifloxacin] Hives   Moxifloxacin Hcl In Nacl Diarrhea and Hives   Nitrofurantoin Diarrhea  Other Reaction(s): GI Intolerance   Nsaids Other (See Comments)    Chest pain    Sulfa Antibiotics Hives and Other (See Comments)    Chest pain    Chlorthalidone Other (See Comments)    Unknown    Hydrochlorothiazide     unknown    Pertinent  Health Maintenance Due  Topic Date Due   DEXA SCAN  Never done   Influenza Vaccine  06/22/2024   Colonoscopy  11/30/2028   Mammogram  Discontinued    Medications: Outpatient Encounter Medications as of 10/02/2024  Medication Sig   acetaminophen  (TYLENOL ) 500 MG tablet Take 2 tablets (1,000 mg total) by mouth every 6 (six) hours.   acetaminophen  (TYLENOL ) 500 MG tablet Take 1,000 mg by mouth once. Give 2 tablet by mouth one time only for Pain for 1 Day   acidophilus (RISAQUAD) CAPS capsule Take 1 capsule by mouth daily.   amLODipine  (NORVASC ) 5 MG tablet TAKE ONE TABLET BY MOUTH ONCE DAILY   Ascorbic Acid (VITAMIN C) 1000 MG tablet Take 1,000 mg by mouth daily.   brimonidine (ALPHAGAN) 0.2 % ophthalmic solution Place 1 drop into both eyes in the morning and at bedtime.   cephALEXin  (KEFLEX ) 250 MG capsule Take 250 mg by mouth daily.   dexlansoprazole (DEXILANT) 60 MG capsule Take 60 mg by mouth daily.   DULoxetine  (CYMBALTA ) 60 MG capsule Take 1 capsule (60 mg total) by mouth daily.   fexofenadine (ALLEGRA) 180 MG tablet Take 180 mg by mouth daily.   fluticasone  (FLONASE ) 50 MCG/ACT nasal spray Place 1 spray into both  nostrils daily.   folic acid (FOLVITE) 1 MG tablet Take 1 mg by mouth daily.   gabapentin  (NEURONTIN ) 300 MG capsule Take 1 capsule (300 mg total) by mouth in the morning and at bedtime.   guaiFENesin (MUCINEX) 600 MG 12 hr tablet Take 600 mg by mouth daily as needed for cough.   hydrOXYzine  (ATARAX ) 25 MG tablet Take 25 mg by mouth at bedtime.   levothyroxine  (SYNTHROID , LEVOTHROID) 125 MCG tablet Take 125 mcg by mouth daily before breakfast.   meclizine  (ANTIVERT ) 25 MG tablet Take 25 mg by mouth as needed for dizziness.   methotrexate (RHEUMATREX) 2.5 MG tablet Take 5 mg by mouth 2 (two) times a week.   naloxone  (NARCAN ) nasal spray 4 mg/0.1 mL Place 1 spray into the nose once as needed for up to 1 dose. Use if unresponsive or respirations < 8   ondansetron  (ZOFRAN -ODT) 4 MG disintegrating tablet Take 4 mg by mouth every 6 (six) hours as needed for vomiting or nausea.   oxyCODONE  (ROXICODONE ) 5 MG immediate release tablet Take 2 tablets (10 mg total) by mouth every 4 (four) hours for 3 days.   polyethylene glycol (MIRALAX  / GLYCOLAX ) 17 g packet Take 17 g by mouth 2 (two) times daily.   polyethylene glycol powder (GLYCOLAX /MIRALAX ) 17 GM/SCOOP powder Take 17 g by mouth daily as needed.   rivaroxaban (XARELTO) 10 MG TABS tablet Take 1 tablet (10 mg total) by mouth daily with breakfast for 21 days.   senna (SENOKOT) 8.6 MG TABS tablet Take 2 tablets (17.2 mg total) by mouth at bedtime for 14 days.   tiZANidine  (ZANAFLEX ) 2 MG tablet Take 1 tablet (2 mg total) by mouth every 8 (eight) hours as needed for muscle spasms.   cholecalciferol  (VITAMIN D ) 1000 units tablet Take 2,000 Units by mouth daily. (Patient not taking: Reported on 10/02/2024)   [DISCONTINUED] Multiple Vitamins-Minerals (PRESERVISION AREDS PO)  Take 1 tablet by mouth 2 (two) times daily. (Patient not taking: Reported on 10/02/2024)   Facility-Administered Encounter Medications as of 10/02/2024  Medication   0.9 %  sodium chloride   infusion    Review of Systems  Vitals:   10/02/24 1451  BP: 130/75  Pulse: 76  Resp: 16  Temp: 98.2 F (36.8 C)  SpO2: 92%  Weight: 170 lb 3.2 oz (77.2 kg)  Height: 5' 3 (1.6 m)   Body mass index is 30.15 kg/m. Physical Exam  Labs reviewed: Basic Metabolic Panel: Recent Labs    09/13/24 1044 09/26/24 0358  NA 140 135  K 4.0 4.6  CL 103 103  CO2 27 21*  GLUCOSE 97 128*  BUN 13 14  CREATININE 0.90 0.87  CALCIUM  8.9 8.9    Liver Function Tests: Recent Labs    09/13/24 1044  AST 16  ALT 12  ALKPHOS 109  BILITOT 0.4  PROT 6.9  ALBUMIN 4.3    No results for input(s): LIPASE, AMYLASE in the last 8760 hours. No results for input(s): AMMONIA in the last 8760 hours. CBC: Recent Labs    09/13/24 1044 09/26/24 0358  WBC 8.6 10.1  HGB 12.4 11.2*  HCT 39.7 36.3  MCV 92.3 92.6  PLT 324 261    Cardiac Enzymes: No results for input(s): CKTOTAL, CKMB, CKMBINDEX, TROPONINI in the last 8760 hours. BNP: Invalid input(s): POCBNP CBG: No results for input(s): GLUCAP in the last 8760 hours.  Procedures and Imaging Studies During Stay: No results found.  Assessment/Plan:   1. S/P total knee arthroplasty, right (Primary) ***  2. Chronic pain syndrome ***  3. Acquired hypothyroidism ***    Patient is being discharged with the following home health services:    Patient is being discharged with the following durable medical equipment:    Patient has been advised to f/u with their PCP in 1-2 weeks to for a transitions of care visit.  Social services at their facility was responsible for arranging this appointment.  Pt was provided with adequate prescriptions of noncontrolled medications to reach the scheduled appointment .  For controlled substances, a limited supply was provided as appropriate for the individual patient.  If the pt normally receives these medications from a pain clinic or has a contract with another physician, these  medications should be received from that clinic or physician only).    Future labs/tests needed:  ***   This encounter was created in error - please disregard.

## 2024-10-03 DIAGNOSIS — R2689 Other abnormalities of gait and mobility: Secondary | ICD-10-CM | POA: Diagnosis not present

## 2024-10-03 DIAGNOSIS — Z471 Aftercare following joint replacement surgery: Secondary | ICD-10-CM | POA: Diagnosis not present

## 2024-10-03 DIAGNOSIS — M6281 Muscle weakness (generalized): Secondary | ICD-10-CM | POA: Diagnosis not present

## 2024-10-03 DIAGNOSIS — M1711 Unilateral primary osteoarthritis, right knee: Secondary | ICD-10-CM | POA: Diagnosis not present

## 2024-10-03 DIAGNOSIS — R278 Other lack of coordination: Secondary | ICD-10-CM | POA: Diagnosis not present

## 2024-10-03 DIAGNOSIS — Z96651 Presence of right artificial knee joint: Secondary | ICD-10-CM | POA: Diagnosis not present

## 2024-10-03 NOTE — Progress Notes (Signed)
 Location:  [facility] Room Number: 157/A Place of Service:  SNF [phone]) Provider:  Greig FORBES Cluster, NP   Loreli Elsie JONETTA Mickey., MD  Patient Care Team: Loreli Elsie JONETTA Mickey., MD as PCP - General (Internal Medicine)  Extended Emergency Contact Information Primary Emergency Contact: Sypher,David Dr. Address: [address], [address]          [place], [place] [address] [place] Home Phone: [phone] Mobile Phone: [phone] Relation: Spouse Secondary Emergency Contact: Lapierre,Kim  [place] Home Phone: [phone] Relation: Daughter  Code Status:  Full code  Goals of care: Advanced Directive information    09/25/2024    3:27 PM  Advanced Directives  Does Patient Have a Medical Advance Directive? Yes  Type of Estate Agent of [place];Living will  Does patient want to make changes to medical advance directive? No - Patient declined  Copy of Healthcare Power of Attorney in Chart? No - copy requested     Chief Complaint  Patient presents with   Discharge Note    HPI:  Pt is a 78 y.o. female seen today for discharge evaluation.   She currently resides on the rehab unit at [facility]. PMH: GERD, IBS, hypothyroidism, chronic pain, cervical radiculopathy, vertigo, balance issues, depression and macular degeneration.   09/25/2024, s/p right total knee arthroplasty by Dr. Ernie. She is taking oxycodone , tylenol , gabapentin  and xanaflex for pain. Also has polar ice machine. Pain has varied based on activity. She is working with PT/OT. WBAT with walker. No recent falls. Xarelto for DVT prophylaxis. Denies chest pain, shortness of breath, calf pain. Aquacell intact. She has showered and had a few bowel movements. She is requesting extra oxycodone  for  emergency pain, which I have declined at this time. Discussed increased risks of narcotic use including falls risk, confusion, sedation and drowsiness. She  is also taking Cymbalta  and hydroxyzine . Husband plans to help with care at home. PT/OT plan to follow her at home. Afebrile. Vitals stable.    Past Medical History:  Diagnosis Date   Allergy    seasonal and environmental   Anxiety    Arthritis    osteoarthritis   Cancer (HCC)    melanoma on RLE   Cataract    Complication of anesthesia 90's   block for elbow surgery and pt had a seizure... surgery since with no problem    Depression    Fibromyalgia    GERD (gastroesophageal reflux disease)    Glaucoma of both eyes    H/O hiatal hernia    History of pericarditis    DEC 2008 AND SMALL PERICARDIAL EFFUSION--   RESOLVED   Hyperlipidemia    Hypertension    Hypothyroidism    IBS (irritable bowel syndrome)    Interstitial cystitis    Migraines    Osteoporosis    Pelvic pain    Seizures (HCC)    one occurence in the 1990's after anesthesia, no problems since   Spinal stenosis    Vitamin D  deficiency    Past Surgical History:  Procedure Laterality Date   ANTERIOR CERVICAL DECOMP/DISCECTOMY FUSION  12/08/2011   Procedure: ANTERIOR CERVICAL DECOMPRESSION/DISCECTOMY FUSION 3 LEVELS;  Surgeon: Oneil Rodgers Priestly, MD;  Location: [facility] OR;  Service: Orthopedics;  Laterality: N/A;  C 3-6 ACDF   BREAST BIOPSY Right    CARDIAC CATHETERIZATION  10-27-2007  DR WOLM NIDA   NORMAL CORONARY ARTERIES/ NORMAL LVF   CARDIOVASCULAR STRESS TEST  02-25-2010  DR PIETRO   ANTERIOR ATTENUATION NO SCAR OR ISCHEMIA/ EF 81%   CATARACT EXTRACTION W/ INTRAOCULAR LENS  IMPLANT, BILATERAL     CESAREAN SECTION  1975   COLONOSCOPY     CYSTO WITH HYDRODISTENSION N/A 06/14/2013   Procedure: CYSTOSCOPY/HYDRODISTENSION/INSTILLATION OF MARCAINE  AND PYRIDIUM ;  Surgeon: Glendia DELENA Elizabeth, MD;  Location: [facility];  Service: Urology;  Laterality: N/A;   ELBOW TENDON RELEASE Left 1996   GLAUCOMA SURGERY Bilateral 2004   RHINOPLASTY  2010   SPINAL FUSION N/A 2017   L 4-5, S-1   SPINAL  FUSION  11/25/2022   L2,3, L3,4, and revision of previous fusions   THUMB TENDON TRANSPOSITION Right 2012   TONSILLECTOMY  1951   TOTAL ABDOMINAL HYSTERECTOMY W/ BILATERAL SALPINGOOPHORECTOMY  1982   TOTAL KNEE ARTHROPLASTY Right 09/25/2024   Procedure: ARTHROPLASTY, KNEE, TOTAL;  Surgeon: Ernie Cough, MD;  Location: [facility];  Service: Orthopedics;  Laterality: Right;   TRANSTHORACIC ECHOCARDIOGRAM  03/04/2010   MODERATE LVH/ NORMAL LVSF/ MILD DIASTOLIC DYSFUNCTION/ EF 60%/ MILD AI   UPPER GASTROINTESTINAL ENDOSCOPY      Allergies  Allergen Reactions   Aspirin Other (See Comments)    Chest pain    Avelox [Moxifloxacin] Hives   Moxifloxacin Hcl In Nacl Diarrhea and Hives   Nitrofurantoin Diarrhea    Other Reaction(s): GI Intolerance   Nsaids Other (See Comments)    Chest pain    Sulfa Antibiotics Hives and Other (See Comments)    Chest pain    Chlorthalidone Other (See Comments)    Unknown    Hydrochlorothiazide     unknown    Outpatient Encounter Medications as of 10/02/2024  Medication Sig   acetaminophen  (TYLENOL ) 500 MG tablet Take 2 tablets (1,000 mg total) by mouth every 6 (six) hours.   acetaminophen  (TYLENOL ) 500 MG tablet Take 1,000 mg by mouth once. Give 2 tablet by mouth one time only for Pain for 1 Day   acidophilus (RISAQUAD) CAPS capsule Take 1 capsule by mouth daily.   amLODipine  (NORVASC ) 5 MG tablet TAKE ONE TABLET BY MOUTH ONCE DAILY   Ascorbic Acid (VITAMIN C) 1000 MG tablet Take 1,000 mg by mouth daily.   brimonidine (ALPHAGAN) 0.2 % ophthalmic solution Place 1 drop into both eyes in the morning and at bedtime.   cephALEXin  (KEFLEX ) 250 MG capsule Take 250 mg by mouth daily.   dexlansoprazole (DEXILANT) 60 MG capsule Take 60 mg by mouth daily.   DULoxetine  (CYMBALTA ) 60 MG capsule Take 1 capsule (60 mg total) by mouth daily.   fexofenadine (ALLEGRA) 180 MG tablet Take 180 mg by mouth daily.   fluticasone  (FLONASE ) 50 MCG/ACT nasal spray Place 1 spray  into both nostrils daily.   folic acid (FOLVITE) 1 MG tablet Take 1 mg by mouth daily.   gabapentin  (NEURONTIN ) 300 MG capsule Take 1 capsule (300 mg total) by mouth in the morning and at bedtime.   guaiFENesin (MUCINEX) 600 MG 12 hr tablet Take 600 mg by mouth daily as needed for cough.   hydrOXYzine  (ATARAX ) 25 MG tablet Take 25 mg by mouth at bedtime.   levothyroxine  (SYNTHROID , LEVOTHROID) 125 MCG tablet Take 125 mcg by mouth daily before breakfast.   meclizine  (ANTIVERT ) 25 MG tablet Take 25 mg by mouth as needed for dizziness.   methotrexate (RHEUMATREX) 2.5 MG tablet Take 5 mg by mouth 2 (two) times a week.   naloxone  (NARCAN ) nasal spray 4 mg/0.1 mL Place 1 spray into the nose  once as needed for up to 1 dose. Use if unresponsive or respirations < 8   ondansetron  (ZOFRAN -ODT) 4 MG disintegrating tablet Take 4 mg by mouth every 6 (six) hours as needed for vomiting or nausea.   oxyCODONE  (ROXICODONE ) 5 MG immediate release tablet Take 2 tablets (10 mg total) by mouth every 4 (four) hours for 3 days.   polyethylene glycol (MIRALAX  / GLYCOLAX ) 17 g packet Take 17 g by mouth 2 (two) times daily.   polyethylene glycol powder (GLYCOLAX /MIRALAX ) 17 GM/SCOOP powder Take 17 g by mouth daily as needed.   rivaroxaban (XARELTO) 10 MG TABS tablet Take 1 tablet (10 mg total) by mouth daily with breakfast for 21 days.   senna (SENOKOT) 8.6 MG TABS tablet Take 2 tablets (17.2 mg total) by mouth at bedtime for 14 days.   tiZANidine  (ZANAFLEX ) 2 MG tablet Take 1 tablet (2 mg total) by mouth every 8 (eight) hours as needed for muscle spasms.   cholecalciferol  (VITAMIN D ) 1000 units tablet Take 2,000 Units by mouth daily. (Patient not taking: Reported on 10/02/2024)   [DISCONTINUED] Multiple Vitamins-Minerals (PRESERVISION AREDS PO) Take 1 tablet by mouth 2 (two) times daily. (Patient not taking: Reported on 10/02/2024)   Facility-Administered Encounter Medications as of 10/02/2024  Medication   0.9 %  sodium  chloride infusion    Review of Systems  Constitutional:  Negative for fever.  HENT: Negative.    Respiratory:  Negative for shortness of breath.   Cardiovascular:  Negative for chest pain and leg swelling.  Gastrointestinal:  Negative for abdominal distention, abdominal pain, constipation, nausea and vomiting.  Genitourinary: Negative.   Musculoskeletal:  Positive for arthralgias and gait problem.  Skin:  Positive for wound.  Neurological:  Positive for weakness. Negative for dizziness and headaches.  Psychiatric/Behavioral:  Negative for dysphoric mood. The patient is not nervous/anxious.     Immunization History  Administered Date(s) Administered   Fluzone Influenza virus vaccine,trivalent (IIV3), split virus 08/13/2010, 08/15/2012   INFLUENZA, HIGH DOSE SEASONAL PF 08/12/2017, 08/28/2022   Influenza Split 12/30/2009, 09/09/2011, 08/21/2013, 08/20/2014   Influenza, Quadrivalent, Recombinant, Inj, Pf 07/29/2018, 08/23/2019   Influenza,inj,Quad PF,6+ Mos 08/20/2014, 07/18/2015   Influenza,inj,quad, With Preservative 08/22/2018   Influenza-Unspecified 09/23/2020, 08/28/2022   Moderna Sars-Covid-2 Vaccination 12/04/2019, 01/01/2020, 08/07/2020, 08/18/2021   Pfizer(Comirnaty )Fall Seasonal Vaccine 12 years and older 09/15/2023   Pneumococcal Conjugate-13 04/10/2014   Pneumococcal Polysaccharide-23 12/30/2009, 01/05/2011, 04/04/2013   Td (Adult),5 Lf Tetanus Toxid, Preservative Free 12/30/2009   Tdap 02/23/2022   Zoster Recombinant(Shingrix ) 11/25/2023, 02/15/2024   Zoster, Live 01/05/2011   Pertinent  Health Maintenance Due  Topic Date Due   DEXA SCAN  Never done   Influenza Vaccine  06/22/2024   Colonoscopy  11/30/2028   Mammogram  Discontinued      08/26/2019    5:19 AM 05/21/2021    6:54 AM 10/16/2022    6:49 PM 12/01/2022   11:53 AM 10/02/2024    3:55 PM  Fall Risk  Falls in the past year?    0 0  Was there an injury with Fall?    0 0  Fall Risk Category Calculator     0   Fall Risk Category (Retired)    Low    (RETIRED) Patient Fall Risk Level Low fall risk  Low fall risk  Low fall risk  Low fall risk    Patient at Risk for Falls Due to    No Fall Risks Impaired balance/gait;Impaired mobility  Fall risk Follow up  Falls evaluation completed  Falls evaluation completed     Data saved with a previous flowsheet row definition   Functional Status Survey:    Vitals:   10/02/24 1451  BP: 130/75  Pulse: 76  Resp: 16  Temp: 98.2 F (36.8 C)  SpO2: 92%  Weight: 170 lb 3.2 oz (77.2 kg)  Height: 5' 3 (1.6 m)   Body mass index is 30.15 kg/m. Physical Exam Vitals reviewed.  Constitutional:      General: She is not in acute distress.    Appearance: She is not ill-appearing.  HENT:     Head: Normocephalic.  Eyes:     General:        Right eye: No discharge.        Left eye: No discharge.  Cardiovascular:     Rate and Rhythm: Normal rate and regular rhythm.     Pulses: Normal pulses.     Heart sounds: Normal heart sounds.  Pulmonary:     Effort: Pulmonary effort is normal.     Breath sounds: Normal breath sounds.  Abdominal:     General: Bowel sounds are normal.     Palpations: Abdomen is soft.  Musculoskeletal:     Cervical back: Neck supple.     Right lower leg: Edema present.     Left lower leg: No edema.     Comments: RLE non pitting edema  Skin:    General: Skin is warm.     Comments: Mild swelling to right knee, surgical dressing CDI, no sign of infection  Neurological:     General: No focal deficit present.     Mental Status: She is alert and oriented to person, place, and time.     Gait: Gait abnormal.  Psychiatric:        Mood and Affect: Mood normal.     Labs reviewed: Recent Labs    09/13/24 1044 09/26/24 0358  NA 140 135  K 4.0 4.6  CL 103 103  CO2 27 21*  GLUCOSE 97 128*  BUN 13 14  CREATININE 0.90 0.87  CALCIUM  8.9 8.9   Recent Labs    09/13/24 1044  AST 16  ALT 12  ALKPHOS 109  BILITOT 0.4  PROT  6.9  ALBUMIN 4.3   Recent Labs    09/13/24 1044 09/26/24 0358  WBC 8.6 10.1  HGB 12.4 11.2*  HCT 39.7 36.3  MCV 92.3 92.6  PLT 324 261    No results found for: HGBA1C No results found for: CHOL, HDL, LDLCALC, LDLDIRECT, TRIG, CHOLHDL  Significant Diagnostic Results in last 30 days:  No results found.  Assessment/Plan 1. S/P total knee arthroplasty, right (Primary) - RTKA 11/04 > Dr. Ernie - intermittent pain> cont tylenol , oxycodone  and xanaflex> do not recommend extra oxycodone  due to risk of falls, sedation and downiness - also taking gabapentin , cymbalta  and hydroxyzine   - cont polar ice machine  - WBAT with walker - cont Xarelto for DVT prophylaxis  - cont HH PT/OT  2. Chronic pain syndrome - see above  3. Acquired hypothyroidism - cont levothyroxine    [facility] to schedule 2 week follow up with Dr. Loreli.    Family/ staff Communication: plan discussed with patient and nurse  Labs/tests ordered:  none

## 2024-10-05 DIAGNOSIS — Z96651 Presence of right artificial knee joint: Secondary | ICD-10-CM | POA: Diagnosis not present

## 2024-10-09 ENCOUNTER — Other Ambulatory Visit (HOSPITAL_BASED_OUTPATIENT_CLINIC_OR_DEPARTMENT_OTHER): Payer: Self-pay

## 2024-10-09 DIAGNOSIS — M961 Postlaminectomy syndrome, not elsewhere classified: Secondary | ICD-10-CM | POA: Diagnosis not present

## 2024-10-09 DIAGNOSIS — M5416 Radiculopathy, lumbar region: Secondary | ICD-10-CM | POA: Diagnosis not present

## 2024-10-09 DIAGNOSIS — M47818 Spondylosis without myelopathy or radiculopathy, sacral and sacrococcygeal region: Secondary | ICD-10-CM | POA: Diagnosis not present

## 2024-10-09 DIAGNOSIS — G894 Chronic pain syndrome: Secondary | ICD-10-CM | POA: Diagnosis not present

## 2024-10-09 MED ORDER — OXYCODONE-ACETAMINOPHEN 10-325 MG PO TABS
1.0000 | ORAL_TABLET | ORAL | 0 refills | Status: AC | PRN
  Filled 2024-10-09: qty 180, 30d supply, fill #0

## 2024-10-15 DIAGNOSIS — M6281 Muscle weakness (generalized): Secondary | ICD-10-CM | POA: Diagnosis not present

## 2024-10-15 DIAGNOSIS — Z96651 Presence of right artificial knee joint: Secondary | ICD-10-CM | POA: Diagnosis not present

## 2024-10-22 ENCOUNTER — Other Ambulatory Visit (HOSPITAL_BASED_OUTPATIENT_CLINIC_OR_DEPARTMENT_OTHER): Payer: Self-pay

## 2024-10-22 MED ORDER — GABAPENTIN 300 MG PO CAPS
300.0000 mg | ORAL_CAPSULE | Freq: Two times a day (BID) | ORAL | 2 refills | Status: AC
  Filled 2024-10-22: qty 60, 30d supply, fill #0

## 2024-11-05 ENCOUNTER — Other Ambulatory Visit (HOSPITAL_BASED_OUTPATIENT_CLINIC_OR_DEPARTMENT_OTHER): Payer: Self-pay

## 2024-11-05 MED ORDER — OXYCODONE-ACETAMINOPHEN 10-325 MG PO TABS
1.0000 | ORAL_TABLET | ORAL | 0 refills | Status: AC | PRN
  Filled 2024-11-06: qty 180, 30d supply, fill #0

## 2024-11-06 ENCOUNTER — Other Ambulatory Visit (HOSPITAL_BASED_OUTPATIENT_CLINIC_OR_DEPARTMENT_OTHER): Payer: Self-pay

## 2024-11-06 MED ORDER — GABAPENTIN 300 MG PO CAPS
300.0000 mg | ORAL_CAPSULE | Freq: Three times a day (TID) | ORAL | 2 refills | Status: AC
  Filled 2024-11-06: qty 90, 30d supply, fill #0

## 2024-12-04 ENCOUNTER — Other Ambulatory Visit (HOSPITAL_BASED_OUTPATIENT_CLINIC_OR_DEPARTMENT_OTHER): Payer: Self-pay

## 2024-12-04 MED ORDER — GABAPENTIN 300 MG PO CAPS
300.0000 mg | ORAL_CAPSULE | Freq: Three times a day (TID) | ORAL | 2 refills | Status: AC
  Filled 2024-12-04: qty 90, 30d supply, fill #0

## 2024-12-04 MED ORDER — OXYCODONE-ACETAMINOPHEN 10-325 MG PO TABS: 1.0000 | ORAL_TABLET | ORAL | 0 refills | Status: AC | PRN

## 2024-12-04 MED ORDER — OXYCODONE-ACETAMINOPHEN 10-325 MG PO TABS
1.0000 | ORAL_TABLET | ORAL | 0 refills | Status: AC | PRN
  Filled 2024-12-06: qty 42, 7d supply, fill #0
  Filled 2024-12-12: qty 138, 23d supply, fill #1

## 2024-12-06 ENCOUNTER — Other Ambulatory Visit (HOSPITAL_BASED_OUTPATIENT_CLINIC_OR_DEPARTMENT_OTHER): Payer: Self-pay

## 2024-12-11 ENCOUNTER — Other Ambulatory Visit (HOSPITAL_COMMUNITY): Payer: Self-pay

## 2024-12-12 ENCOUNTER — Other Ambulatory Visit (HOSPITAL_BASED_OUTPATIENT_CLINIC_OR_DEPARTMENT_OTHER): Payer: Self-pay
